# Patient Record
Sex: Male | Born: 1995 | Race: White | Hispanic: No | Marital: Single | Smoking: Current every day smoker
Health system: Southern US, Community
[De-identification: ages and names within clinical notes are randomized; demographics above are authoritative.]

## PROBLEM LIST (undated history)

## (undated) DIAGNOSIS — F419 Anxiety disorder, unspecified: Secondary | ICD-10-CM

## (undated) DIAGNOSIS — R569 Unspecified convulsions: Secondary | ICD-10-CM

## (undated) DIAGNOSIS — F32A Depression, unspecified: Secondary | ICD-10-CM

## (undated) DIAGNOSIS — Z973 Presence of spectacles and contact lenses: Secondary | ICD-10-CM

## (undated) DIAGNOSIS — F1910 Other psychoactive substance abuse, uncomplicated: Secondary | ICD-10-CM

## (undated) DIAGNOSIS — F329 Major depressive disorder, single episode, unspecified: Secondary | ICD-10-CM

## (undated) HISTORY — PX: FRACTURE SURGERY: SHX138

## (undated) HISTORY — PX: COLON SURGERY: SHX602

## (undated) HISTORY — DX: Presence of spectacles and contact lenses: Z97.3

---
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
## 2016-06-23 ENCOUNTER — Emergency Department (HOSPITAL_COMMUNITY)
Admission: EM | Admit: 2016-06-23 | Discharge: 2016-06-23 | Disposition: A | Discharge status: Home / Self Care | Payer: Medicaid Other | Attending: Emergency Medicine | Admitting: Emergency Medicine

## 2016-06-23 ENCOUNTER — Encounter (HOSPITAL_COMMUNITY): Payer: Self-pay | Admitting: *Deleted

## 2016-06-23 DIAGNOSIS — N3000 Acute cystitis without hematuria: Secondary | ICD-10-CM | POA: Insufficient documentation

## 2016-06-23 DIAGNOSIS — Z791 Long term (current) use of non-steroidal anti-inflammatories (NSAID): Secondary | ICD-10-CM | POA: Insufficient documentation

## 2016-06-23 DIAGNOSIS — J45909 Unspecified asthma, uncomplicated: Secondary | ICD-10-CM | POA: Insufficient documentation

## 2016-06-23 DIAGNOSIS — F418 Other specified anxiety disorders: Secondary | ICD-10-CM | POA: Insufficient documentation

## 2016-06-23 DIAGNOSIS — Z79899 Other long term (current) drug therapy: Secondary | ICD-10-CM | POA: Insufficient documentation

## 2016-06-23 DIAGNOSIS — F17210 Nicotine dependence, cigarettes, uncomplicated: Secondary | ICD-10-CM | POA: Insufficient documentation

## 2016-06-23 DIAGNOSIS — F419 Anxiety disorder, unspecified: Secondary | ICD-10-CM

## 2016-06-23 DIAGNOSIS — F329 Major depressive disorder, single episode, unspecified: Secondary | ICD-10-CM

## 2016-06-23 HISTORY — DX: Anxiety disorder, unspecified: F41.9

## 2016-06-23 LAB — CBC WITH DIFFERENTIAL/PLATELET
Basophils Absolute: 0 10*3/uL (ref 0.0–0.1)
Basophils Relative: 0 %
EOS ABS: 0 10*3/uL (ref 0.0–0.7)
EOS PCT: 0 %
HCT: 45.3 % (ref 39.0–52.0)
HEMOGLOBIN: 15.7 g/dL (ref 13.0–17.0)
LYMPHS ABS: 2.4 10*3/uL (ref 0.7–4.0)
LYMPHS PCT: 20 %
MCH: 32.3 pg (ref 26.0–34.0)
MCHC: 34.7 g/dL (ref 30.0–36.0)
MCV: 93.2 fL (ref 78.0–100.0)
MONOS PCT: 7 %
Monocytes Absolute: 0.8 10*3/uL (ref 0.1–1.0)
Neutro Abs: 8.7 10*3/uL — ABNORMAL HIGH (ref 1.7–7.7)
Neutrophils Relative %: 73 %
PLATELETS: 259 10*3/uL (ref 150–400)
RBC: 4.86 MIL/uL (ref 4.22–5.81)
RDW: 12.7 % (ref 11.5–15.5)
WBC: 11.9 10*3/uL — ABNORMAL HIGH (ref 4.0–10.5)

## 2016-06-23 LAB — COMPREHENSIVE METABOLIC PANEL
ALK PHOS: 73 U/L (ref 38–126)
ALT: 19 U/L (ref 17–63)
ANION GAP: 10 (ref 5–15)
AST: 25 U/L (ref 15–41)
Albumin: 4.9 g/dL (ref 3.5–5.0)
BUN: 13 mg/dL (ref 6–20)
CALCIUM: 9.5 mg/dL (ref 8.9–10.3)
CO2: 26 mmol/L (ref 22–32)
CREATININE: 0.88 mg/dL (ref 0.61–1.24)
Chloride: 101 mmol/L (ref 101–111)
Glucose, Bld: 77 mg/dL (ref 65–99)
Potassium: 3.3 mmol/L — ABNORMAL LOW (ref 3.5–5.1)
SODIUM: 137 mmol/L (ref 135–145)
Total Bilirubin: 0.7 mg/dL (ref 0.3–1.2)
Total Protein: 8.3 g/dL — ABNORMAL HIGH (ref 6.5–8.1)

## 2016-06-23 LAB — URINALYSIS, ROUTINE W REFLEX MICROSCOPIC
BILIRUBIN URINE: NEGATIVE
GLUCOSE, UA: 50 mg/dL — AB
HGB URINE DIPSTICK: NEGATIVE
KETONES UR: 20 mg/dL — AB
NITRITE: NEGATIVE
PH: 7 (ref 5.0–8.0)
Protein, ur: 30 mg/dL — AB
SPECIFIC GRAVITY, URINE: 1.023 (ref 1.005–1.030)

## 2016-06-23 MED ORDER — CEPHALEXIN 500 MG PO CAPS: 500.0000 mg | ORAL_CAPSULE | Freq: Four times a day (QID) | ORAL | 0 refills | Status: DC

## 2016-06-23 MED ORDER — ALPRAZOLAM 0.25 MG PO TABS: 0.2500 mg | ORAL_TABLET | Freq: Three times a day (TID) | ORAL | 0 refills | Status: DC | PRN

## 2016-06-23 NOTE — ED Notes (Signed)
Patient states "I've dealt with depression for a long time. I used to be on medicine but I don't remember what it's called. I stopped taking it a long time ago because I didn't like the way it made me feel. I don't have any family at all. I'm nauseated and just feel really anxious." Patient denies SI/HI.

## 2016-06-23 NOTE — ED Triage Notes (Signed)
Pt c/o panic attack since last night. Reports hx anxiety and depression. Denies SI/HI. Pt reports decreased PO intake over the last 3 days due to depression and anxiety.

## 2016-06-24 LAB — GC/CHLAMYDIA PROBE AMP (~~LOC~~) NOT AT ARMC
CHLAMYDIA, DNA PROBE: POSITIVE — AB
Neisseria Gonorrhea: NEGATIVE

## 2016-06-25 LAB — URINE CULTURE
CULTURE: NO GROWTH
Special Requests: NORMAL

## 2016-06-26 NOTE — ED Provider Notes (Signed)
[facility]-EM[facility]ENCY DEPT Provider Note   CSN: [id number] Arrival date & time: 06/23/16  1129     History   Chief Complaint Chief Complaint  Patient presents with  . Panic Attack    HPI Marc [name] is a 20 y.o. male who has a long standing history of anxiety and depression asking for assistance with increased anxiety over the past several weeks.  He denies any specific inciting event and denies full blown panic attack.  He denies suicidal or homicidal ideation or intent as well.  He reports a distant history of being on anti depressants but did not like how they made him feel so prefers to not take them.  He also reports having some mild nausea over the past several days, several episodes of body aches and feeling feverish.  He denies vomiting, uri or respiratory complaint, denies abdominal pain, diarrhea.  He does endorse increased urinary frequency but denies dysuria, hematuria, back pain and increased thirst.  He denies penile discharge.  He is sexually active with one partner.   The history is provided by the patient.    Past Medical History:  Diagnosis Date  . Anxiety   . Asthma   . Depressed   . Wears glasses     Patient Active Problem List   Diagnosis Date Noted  . Depression, major, recurrent, in partial remission (HCC) 06/03/2011  . ELBOW SPRAIN, LEFT 09/17/2010    Past Surgical History:  Procedure Laterality Date  . COLON SURGERY    . FRACTURE SURGERY         Home Medications    Prior to Admission medications   Medication Sig Start Date End Date Taking? Authorizing Provider  albuterol (PROVENTIL HFA;VENTOLIN HFA) 108 (90 BASE) MCG/ACT inhaler Inhale 2 puffs into the lungs every 6 (six) hours as needed for wheezing.    Historical Provider, MD  ALPRAZolam Prudy Feeler(XANAX) 0.25 MG tablet Take 1 tablet (0.25 mg total) by mouth 3 (three) times daily as needed for anxiety. 06/23/16   Burgess AmorJulie Dionta Larke, PA-C  cephALEXin (KEFLEX) 500 MG capsule Take 1 capsule (500 mg total) by  mouth 4 (four) times daily. 06/23/16   Burgess AmorJulie Lakechia Nay, PA-C  HYDROcodone-acetaminophen (NORCO) 5-325 MG per tablet Take 1 tablet by mouth every 4 (four) hours as needed for moderate pain. 08/06/13   Ivery QualeHobson Bryant, PA-C  methocarbamol (ROBAXIN) 500 MG tablet Take 1 tablet (500 mg total) by mouth 3 (three) times daily. 08/06/13   Ivery QualeHobson Bryant, PA-C  naproxen sodium (ANAPROX) 275 MG tablet Take one tablet twice daily as needed for knee pain. 04/27/14   Paula LibraJohn Molpus, MD    Family History Family History  Problem Relation Age of Onset  . Anxiety disorder Father   . Depression Father   . Anxiety disorder Paternal Grandfather   . Anxiety disorder Mother   . Depression Mother   . Depression Cousin   . Drug abuse Cousin     Social History Social History  Substance Use Topics  . Smoking status: Current Every Day Smoker    Packs/day: 2.00    Types: Cigarettes  . Smokeless tobacco: Never Used  . Alcohol use Yes     Comment: occasionally     Allergies   Promethazine hcl   Review of Systems Review of Systems  Constitutional: Positive for fever.  HENT: Negative for congestion and sore throat.   Eyes: Negative.   Respiratory: Negative for chest tightness and shortness of breath.   Cardiovascular: Negative for chest pain.  Gastrointestinal:  Positive for nausea. Negative for abdominal pain, diarrhea and vomiting.  Genitourinary: Positive for frequency. Negative for discharge, flank pain and hematuria.  Musculoskeletal: Negative for arthralgias, joint swelling and neck pain.  Skin: Negative.  Negative for rash and wound.  Neurological: Negative for dizziness, weakness, light-headedness, numbness and headaches.  Psychiatric/Behavioral: Negative for hallucinations, self-injury and suicidal ideas. The patient is nervous/anxious.      Physical Exam Updated Vital Signs BP 144/89 (BP Location: Left Arm)   Pulse 95   Temp 98.5 F (36.9 C) (Oral)   Resp 15   Ht 5\' 5"  (1.651 m)   Wt 52.2 kg    SpO2 98%   BMI 19.14 kg/m   Physical Exam  Constitutional: He appears well-developed and well-nourished.  HENT:  Head: Normocephalic and atraumatic.  Eyes: Conjunctivae are normal.  Neck: Normal range of motion.  Cardiovascular: Normal rate, regular rhythm, normal heart sounds and intact distal pulses.   Pulmonary/Chest: Effort normal and breath sounds normal. He has no wheezes.  Abdominal: Soft. Bowel sounds are normal. He exhibits no distension. There is no tenderness. There is no guarding and no CVA tenderness.  Musculoskeletal: Normal range of motion.  Neurological: He is alert.  Skin: Skin is warm and dry.  Psychiatric: His speech is normal and behavior is normal. Judgment and thought content normal. Cognition and memory are normal. He exhibits a depressed mood. He expresses no homicidal and no suicidal ideation. He expresses no suicidal plans and no homicidal plans.  Flat affect.  Nursing note and vitals reviewed.    ED Treatments / Results  Labs (all labs ordered are listed, but only abnormal results are displayed) Labs Reviewed  CBC WITH DIFFERENTIAL/PLATELET - Abnormal; Notable for the following:       Result Value   WBC 11.9 (*)    Neutro Abs 8.7 (*)    All other components within normal limits  COMPREHENSIVE METABOLIC PANEL - Abnormal; Notable for the following:    Potassium 3.3 (*)    Total Protein 8.3 (*)    All other components within normal limits  URINALYSIS, ROUTINE W REFLEX MICROSCOPIC - Abnormal; Notable for the following:    Color, Urine AMBER (*)    APPearance CLOUDY (*)    Glucose, UA 50 (*)    Ketones, ur 20 (*)    Protein, ur 30 (*)    Leukocytes, UA SMALL (*)    Bacteria, UA RARE (*)    All other components within normal limits  GC/CHLAMYDIA PROBE AMP ([facility]) NOT AT [facility] - Abnormal; Notable for the following:    Chlamydia **POSITIVE** (*)    All other components within normal limits  URINE CULTURE    EKG  EKG Interpretation None         Radiology No results found.  Procedures Procedures (including critical care time)  Medications Ordered in ED Medications - No data to display   Initial Impression / Assessment and Plan / ED Course  I have reviewed the triage vital signs and the nursing notes.  Pertinent labs & imaging results that were available during my care of the patient were reviewed by me and considered in my medical decision making (see chart for details).  Clinical Course     Pt with anxiety and depression, no suicidal or homicidal ideation or intent.  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  He was given a small script of ativan, advised he needs to f/u for counseling for definitive tx.  Pt understands.  Discussed UA, pt denies any sx suggesting std, offered prophylactic tx,  Pt defers, gc/chlamydia added to urine sample.  Pt understands results pending.   Final Clinical Impressions(s) / ED Diagnoses   Final diagnoses:  Acute cystitis without hematuria  Anxiety and depression    New Prescriptions Discharge Medication List as of 06/23/2016  3:19 PM    START taking these medications   Details  ALPRAZolam (XANAX) 0.25 MG tablet Take 1 tablet (0.25 mg total) by mouth 3 (three) times daily as needed for anxiety., Starting Tue 06/23/2016, Print    cephALEXin (KEFLEX) 500 MG capsule Take 1 capsule (500 mg total) by mouth 4 (four) times daily., Starting Tue 06/23/2016, Print         Burgess AmorJulie Jamese Trauger, PA-C 06/26/16 1410     Pt's std urine screen positive for chlamydia.  Prescription for zithromax  To be called in.  Notified charge RN who will notify pt of results and prescription.    Burgess AmorJulie Tyaira Heward, PA-C 06/26/16 1446    Bethann BerkshireJoseph Zammit, MD 06/26/16 [phone]

## 2016-12-01 ENCOUNTER — Encounter (HOSPITAL_COMMUNITY): Payer: Self-pay | Admitting: *Deleted

## 2016-12-01 ENCOUNTER — Emergency Department (HOSPITAL_COMMUNITY)
Admission: EM | Admit: 2016-12-01 | Discharge: 2016-12-01 | Disposition: A | Discharge status: Home / Self Care | Payer: Medicaid Other | Attending: Emergency Medicine | Admitting: Emergency Medicine

## 2016-12-01 DIAGNOSIS — F17210 Nicotine dependence, cigarettes, uncomplicated: Secondary | ICD-10-CM | POA: Insufficient documentation

## 2016-12-01 DIAGNOSIS — F410 Panic disorder [episodic paroxysmal anxiety] without agoraphobia: Secondary | ICD-10-CM | POA: Insufficient documentation

## 2016-12-01 DIAGNOSIS — J45909 Unspecified asthma, uncomplicated: Secondary | ICD-10-CM | POA: Insufficient documentation

## 2016-12-01 NOTE — ED Notes (Signed)
Pt made aware to return if symptoms worsen or if any life threatening symptoms occur.   

## 2016-12-01 NOTE — ED Triage Notes (Signed)
Pt is here for anxiety.  He states that he was seen early this year for the same and was given a prescription for xanax which helped but pt states that his anxiety has become worse.  Pt reports that he is not speaking with anyone in his family and he is out of work, has no transportation all of which is adding to his anxiety.  He states that his depression and anxiety is bad bc he just can't get ahead.  Pt denies any SI/HI.  He would like to be sent somewhere to get help with anxiety and depression.

## 2016-12-01 NOTE — Discharge Instructions (Signed)
ER if symptoms worsen 

## 2016-12-01 NOTE — ED Notes (Signed)
Pt states anxiety and depression from "life itself and things that have gone on for past few years." Pt states he is generally alone and constantly trying to get ahead and not being able to, then becomes depressed and cannot control anxiety/depression. Pt denies si/hi, is calm and cooperative at this time.
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
## 2017-01-14 ENCOUNTER — Encounter (HOSPITAL_COMMUNITY): Payer: Self-pay | Admitting: Emergency Medicine

## 2017-01-14 ENCOUNTER — Emergency Department (HOSPITAL_COMMUNITY)
Admission: EM | Admit: 2017-01-14 | Discharge: 2017-01-15 | Disposition: A | Discharge status: Home / Self Care | Payer: Self-pay | Attending: Emergency Medicine | Admitting: Emergency Medicine

## 2017-01-14 DIAGNOSIS — J45909 Unspecified asthma, uncomplicated: Secondary | ICD-10-CM | POA: Insufficient documentation

## 2017-01-14 DIAGNOSIS — F410 Panic disorder [episodic paroxysmal anxiety] without agoraphobia: Secondary | ICD-10-CM | POA: Insufficient documentation

## 2017-01-14 DIAGNOSIS — F17210 Nicotine dependence, cigarettes, uncomplicated: Secondary | ICD-10-CM | POA: Insufficient documentation

## 2017-01-14 NOTE — ED Triage Notes (Signed)
Pt comes in with anxiety, chest pain, and head pain. He usually takes medication but ran out 2 days ago. NAD noted. Pt is anxious upon triage.

## 2017-01-15 LAB — BASIC METABOLIC PANEL
Anion gap: 10 (ref 5–15)
BUN: 16 mg/dL (ref 6–20)
CALCIUM: 9.4 mg/dL (ref 8.9–10.3)
CHLORIDE: 102 mmol/L (ref 101–111)
CO2: 27 mmol/L (ref 22–32)
Creatinine, Ser: 0.9 mg/dL (ref 0.61–1.24)
Glucose, Bld: 85 mg/dL (ref 65–99)
Potassium: 3.9 mmol/L (ref 3.5–5.1)
SODIUM: 139 mmol/L (ref 135–145)

## 2017-01-15 LAB — CBC WITH DIFFERENTIAL/PLATELET
BASOS PCT: 0 %
Basophils Absolute: 0 10*3/uL (ref 0.0–0.1)
EOS ABS: 0.4 10*3/uL (ref 0.0–0.7)
EOS PCT: 4 %
HCT: 43.7 % (ref 39.0–52.0)
HEMOGLOBIN: 15.4 g/dL (ref 13.0–17.0)
Lymphocytes Relative: 41 %
Lymphs Abs: 3.9 10*3/uL (ref 0.7–4.0)
MCH: 31.9 pg (ref 26.0–34.0)
MCHC: 35.2 g/dL (ref 30.0–36.0)
MCV: 90.5 fL (ref 78.0–100.0)
MONOS PCT: 9 %
Monocytes Absolute: 0.8 10*3/uL (ref 0.1–1.0)
NEUTROS PCT: 46 %
Neutro Abs: 4.3 10*3/uL (ref 1.7–7.7)
PLATELETS: 216 10*3/uL (ref 150–400)
RBC: 4.83 MIL/uL (ref 4.22–5.81)
RDW: 12.4 % (ref 11.5–15.5)
WBC: 9.5 10*3/uL (ref 4.0–10.5)

## 2017-01-15 LAB — TROPONIN I

## 2017-01-15 MED ORDER — ALPRAZOLAM 0.25 MG PO TABS: 0.2500 mg | ORAL_TABLET | Freq: Three times a day (TID) | ORAL | 0 refills | Status: DC

## 2017-01-15 MED ORDER — LORAZEPAM 2 MG/ML IJ SOLN
1.0000 mg | Freq: Once | INTRAMUSCULAR | Status: AC
  Administered 2017-01-15: 1 mg via INTRAMUSCULAR
  Filled 2017-01-15: qty 1

## 2017-01-15 NOTE — Discharge Instructions (Signed)
Your electrocardiogram and your cardiac enzyme all negative for acute cardiac related problem. Your oxygen level is 100% on room air. I suspect that your chest discomfort is related to anxiety and panic attack. Please see your primary physician or mental health professional concerning your anxiety. Use Xanax 3 times daily.

## 2017-01-15 NOTE — ED Provider Notes (Signed)
[facility] Provider Note   CSN: [id number] Arrival date & time: 01/14/17  1659     History   Chief Complaint Chief Complaint  Patient presents with  . Panic Attack    HPI Marc [name] is a 21 y.o. male.  Patient is a 21 year old male who presents to the emergency department with a complaint of panic attack and chest pain.  Patient states that he has a history of anxiety and panic attacks. He states that he has been out of his medication over the last 2-3 days on. He had a friend and had a prolonged seizure that really upset him. He began to have some chest pain. He tried to calm himself down, but since it seems as though the anxiety was getting worse. He then went into panic for most of the day and as the day progressed his chest pain got worse so he came to the emergency department for additional evaluation and management. The patient denies unusual sweats. There's been no vomiting reported. No unusual weakness. No loss of consciousness.      Past Medical History:  Diagnosis Date  . Anxiety   . Asthma   . Depressed   . Wears glasses     Patient Active Problem List   Diagnosis Date Noted  . Depression, major, recurrent, in partial remission (HCC) 06/03/2011  . ELBOW SPRAIN, LEFT 09/17/2010    Past Surgical History:  Procedure Laterality Date  . COLON SURGERY    . FRACTURE SURGERY         Home Medications    Prior to Admission medications   Medication Sig Start Date End Date Taking? Authorizing Provider  albuterol (PROVENTIL HFA;VENTOLIN HFA) 108 (90 BASE) MCG/ACT inhaler Inhale 2 puffs into the lungs every 6 (six) hours as needed for wheezing.    [provider]  ALPRAZolam Prudy Feeler) 0.25 MG tablet Take 1 tablet (0.25 mg total) by mouth 3 (three) times daily as needed for anxiety. 06/23/16   Burgess Amor, PA-C  cephALEXin (KEFLEX) 500 MG capsule Take 1 capsule (500 mg total) by mouth 4 (four) times daily. 06/23/16   Burgess Amor, PA-C    HYDROcodone-acetaminophen (NORCO) 5-325 MG per tablet Take 1 tablet by mouth every 4 (four) hours as needed for moderate pain. 08/06/13   Ivery Quale, PA-C  methocarbamol (ROBAXIN) 500 MG tablet Take 1 tablet (500 mg total) by mouth 3 (three) times daily. 08/06/13   Ivery Quale, PA-C  naproxen sodium (ANAPROX) 275 MG tablet Take one tablet twice daily as needed for knee pain. 04/27/14   Molpus, John, MD    Family History Family History  Problem Relation Age of Onset  . Anxiety disorder Father   . Depression Father   . Anxiety disorder Paternal Grandfather   . Anxiety disorder Mother   . Depression Mother   . Depression Cousin   . Drug abuse Cousin     Social History Social History  Substance Use Topics  . Smoking status: Current Every Day Smoker    Packs/day: 2.00    Types: Cigarettes  . Smokeless tobacco: Never Used  . Alcohol use Yes     Comment: occasionally     Allergies   Promethazine hcl   Review of Systems Review of Systems  Constitutional: Negative for activity change and appetite change.  HENT: Negative for congestion, ear discharge, ear pain, facial swelling, nosebleeds, rhinorrhea, sneezing and tinnitus.   Eyes: Negative for photophobia, pain and discharge.  Respiratory: Negative for cough, choking,  shortness of breath and wheezing.   Cardiovascular: Positive for chest pain. Negative for palpitations and leg swelling.  Gastrointestinal: Negative for abdominal pain, blood in stool, constipation, diarrhea, nausea and vomiting.  Genitourinary: Negative for difficulty urinating, dysuria, flank pain, frequency and hematuria.  Musculoskeletal: Negative for back pain, gait problem, myalgias and neck pain.  Skin: Negative for color change, rash and wound.  Neurological: Negative for dizziness, seizures, syncope, facial asymmetry, speech difficulty, weakness and numbness.  Hematological: Negative for adenopathy. Does not bruise/bleed easily.   Psychiatric/Behavioral: Negative for agitation, confusion, hallucinations, self-injury and suicidal ideas. The patient is nervous/anxious.      Physical Exam Updated Vital Signs BP 116/84   Pulse (!) 50   Resp 12   Ht 5\' 5"  (1.651 m)   Wt 54.4 kg (120 lb)   SpO2 99%   BMI 19.97 kg/m   Physical Exam  Constitutional: He is oriented to person, place, and time. He appears well-developed and well-nourished.  Non-toxic appearance.  HENT:  Head: Normocephalic.  Right Ear: Tympanic membrane and external ear normal.  Left Ear: Tympanic membrane and external ear normal.  Eyes: EOM and lids are normal. Pupils are equal, round, and reactive to light.  Neck: Normal range of motion. Neck supple. Carotid bruit is not present.  Cardiovascular: Normal rate, regular rhythm, normal heart sounds, intact distal pulses and normal pulses.   Pulmonary/Chest: Breath sounds normal. No respiratory distress.  Abdominal: Soft. Bowel sounds are normal. There is no tenderness. There is no guarding.  Musculoskeletal: Normal range of motion.  Lymphadenopathy:       Head (right side): No submandibular adenopathy present.       Head (left side): No submandibular adenopathy present.    He has no cervical adenopathy.  Neurological: He is alert and oriented to person, place, and time. He has normal strength. No cranial nerve deficit or sensory deficit.  Skin: Skin is warm and dry.  Psychiatric: His speech is normal. His mood appears anxious. He expresses no homicidal and no suicidal ideation. He expresses no suicidal plans and no homicidal plans.  Nursing note and vitals reviewed.    ED Treatments / Results  Labs (all labs ordered are listed, but only abnormal results are displayed) Labs Reviewed  CBC WITH DIFFERENTIAL/PLATELET  BASIC METABOLIC PANEL  TROPONIN I    EKG  EKG Interpretation None       Radiology No results found.  Procedures Procedures (including critical care time)  Medications  Ordered in ED Medications  LORazepam (ATIVAN) injection 1 mg (not administered)     Initial Impression / Assessment and Plan / ED Course  I have reviewed the triage vital signs and the nursing notes.  Pertinent labs & imaging results that were available during my care of the patient were reviewed by me and considered in my medical decision making (see chart for details).       Final Clinical Impressions(s) / ED Diagnoses MDM Electrocardiogram shows a normal sinus rhythm. Vital signs reviewed. Pulse oximetry is 99-100% during the emergency department visit.  The complete blood count was well within normal limits. Basic metabolic panel is normal. Troponin is negative for acute event. I ambulated the patient in the room and hall without any change in his chest pain or difficulty with breathing.  Patient treated with intramuscular Ativan.  Patient states that he feels a lot better. I reviewed the examination as well as the lab findings with the patient in terms which he understands. I've asked  patient to see his mental health professional to assist him with his anxiety and panic attacks. The patient states that he will see his physician. He will also try to stay on top of his medications on. Patient acknowledges understanding of these discharge instructions. The patient denies any suicidal or homicidal ideations. Feel that it is safe for the patient to be discharged home.    Final diagnoses:  Panic attack    New Prescriptions Discharge Medication List as of 01/15/2017  1:18 AM       Ivery Quale, PA-C 01/15/17 0127    Glynn Octave, MD 01/15/17 [phone]

## 2017-01-15 NOTE — ED Notes (Signed)
Pt states understanding of care given and follow up instructions.  Pt a/o ambulated from ED with steady gait 

## 2017-03-21 ENCOUNTER — Encounter (HOSPITAL_COMMUNITY): Payer: Self-pay | Admitting: *Deleted

## 2017-03-21 ENCOUNTER — Emergency Department (HOSPITAL_COMMUNITY)
Admission: EM | Admit: 2017-03-21 | Discharge: 2017-03-23 | Disposition: A | Discharge status: Other Acute Inpatient Hospital | Payer: Self-pay | Attending: Emergency Medicine | Admitting: Emergency Medicine

## 2017-03-21 DIAGNOSIS — X789XXA Intentional self-harm by unspecified sharp object, initial encounter: Secondary | ICD-10-CM | POA: Insufficient documentation

## 2017-03-21 DIAGNOSIS — S70921A Unspecified superficial injury of right thigh, initial encounter: Secondary | ICD-10-CM | POA: Insufficient documentation

## 2017-03-21 DIAGNOSIS — F32A Depression, unspecified: Secondary | ICD-10-CM

## 2017-03-21 DIAGNOSIS — F329 Major depressive disorder, single episode, unspecified: Secondary | ICD-10-CM | POA: Insufficient documentation

## 2017-03-21 DIAGNOSIS — Z79899 Other long term (current) drug therapy: Secondary | ICD-10-CM | POA: Insufficient documentation

## 2017-03-21 DIAGNOSIS — F1910 Other psychoactive substance abuse, uncomplicated: Secondary | ICD-10-CM | POA: Insufficient documentation

## 2017-03-21 DIAGNOSIS — Z046 Encounter for general psychiatric examination, requested by authority: Secondary | ICD-10-CM | POA: Insufficient documentation

## 2017-03-21 DIAGNOSIS — Y9389 Activity, other specified: Secondary | ICD-10-CM | POA: Insufficient documentation

## 2017-03-21 DIAGNOSIS — Y929 Unspecified place or not applicable: Secondary | ICD-10-CM | POA: Insufficient documentation

## 2017-03-21 DIAGNOSIS — Y999 Unspecified external cause status: Secondary | ICD-10-CM | POA: Insufficient documentation

## 2017-03-21 DIAGNOSIS — S70922A Unspecified superficial injury of left thigh, initial encounter: Secondary | ICD-10-CM | POA: Insufficient documentation

## 2017-03-21 DIAGNOSIS — Z23 Encounter for immunization: Secondary | ICD-10-CM | POA: Insufficient documentation

## 2017-03-21 DIAGNOSIS — J45909 Unspecified asthma, uncomplicated: Secondary | ICD-10-CM | POA: Insufficient documentation

## 2017-03-21 DIAGNOSIS — F17210 Nicotine dependence, cigarettes, uncomplicated: Secondary | ICD-10-CM | POA: Insufficient documentation

## 2017-03-21 DIAGNOSIS — R45851 Suicidal ideations: Secondary | ICD-10-CM | POA: Insufficient documentation

## 2017-03-21 DIAGNOSIS — F419 Anxiety disorder, unspecified: Secondary | ICD-10-CM | POA: Insufficient documentation

## 2017-03-21 HISTORY — DX: Other psychoactive substance abuse, uncomplicated: F19.10

## 2017-03-21 LAB — ETHANOL

## 2017-03-21 LAB — COMPREHENSIVE METABOLIC PANEL
ALT: 23 U/L (ref 17–63)
AST: 27 U/L (ref 15–41)
Albumin: 4.5 g/dL (ref 3.5–5.0)
Alkaline Phosphatase: 63 U/L (ref 38–126)
Anion gap: 7 (ref 5–15)
BILIRUBIN TOTAL: 1 mg/dL (ref 0.3–1.2)
BUN: 16 mg/dL (ref 6–20)
CHLORIDE: 103 mmol/L (ref 101–111)
CO2: 29 mmol/L (ref 22–32)
CREATININE: 0.97 mg/dL (ref 0.61–1.24)
Calcium: 9.1 mg/dL (ref 8.9–10.3)
Glucose, Bld: 101 mg/dL — ABNORMAL HIGH (ref 65–99)
POTASSIUM: 3.8 mmol/L (ref 3.5–5.1)
Sodium: 139 mmol/L (ref 135–145)
TOTAL PROTEIN: 7.1 g/dL (ref 6.5–8.1)

## 2017-03-21 LAB — RAPID URINE DRUG SCREEN, HOSP PERFORMED
AMPHETAMINES: NOT DETECTED
Barbiturates: NOT DETECTED
Benzodiazepines: POSITIVE — AB
Cocaine: NOT DETECTED
OPIATES: NOT DETECTED
Tetrahydrocannabinol: POSITIVE — AB

## 2017-03-21 LAB — CBC
HCT: 43.5 % (ref 39.0–52.0)
Hemoglobin: 15.1 g/dL (ref 13.0–17.0)
MCH: 32.3 pg (ref 26.0–34.0)
MCHC: 34.7 g/dL (ref 30.0–36.0)
MCV: 92.9 fL (ref 78.0–100.0)
PLATELETS: 202 10*3/uL (ref 150–400)
RBC: 4.68 MIL/uL (ref 4.22–5.81)
RDW: 12.4 % (ref 11.5–15.5)
WBC: 9.8 10*3/uL (ref 4.0–10.5)

## 2017-03-21 LAB — SALICYLATE LEVEL

## 2017-03-21 LAB — ACETAMINOPHEN LEVEL: Acetaminophen (Tylenol), Serum: <10 ug/mL — ABNORMAL LOW (ref 10–30)

## 2017-03-21 MED ORDER — ACETAMINOPHEN 325 MG PO TABS
650.0000 mg | ORAL_TABLET | ORAL | Status: DC | PRN
  Administered 2017-03-22 – 2017-03-23 (×4): 650 mg via ORAL
  Filled 2017-03-21 (×4): qty 2

## 2017-03-21 MED ORDER — TETANUS-DIPHTH-ACELL PERTUSSIS 5-2.5-18.5 LF-MCG/0.5 IM SUSP
0.5000 mL | Freq: Once | INTRAMUSCULAR | Status: AC
  Administered 2017-03-21: 0.5 mL via INTRAMUSCULAR
  Filled 2017-03-21: qty 0.5

## 2017-03-21 MED ORDER — NICOTINE 21 MG/24HR TD PT24
21.0000 mg | MEDICATED_PATCH | Freq: Every day | TRANSDERMAL | Status: DC | PRN
  Administered 2017-03-22 – 2017-03-23 (×3): 21 mg via TRANSDERMAL
  Filled 2017-03-21 (×3): qty 1
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
## 2017-03-21 NOTE — ED Notes (Signed)
Patient stated to writer that he needs Xanax for his anxiety and a pain pill for his chronic back/neck pain. Patient reports coming to ED to get refill of xanax due to not having insurance and able to find PCP. States he sees [facility] and they will not prescribe any xanax. Patient appears calm and cooperative at this time.

## 2017-03-21 NOTE — ED Triage Notes (Signed)
Pt c/o having panic attacks, suicidal thoughts, cutting to bilateral thighs since last night. Pt states, "on my way here, I thought about jumping in front of a car". Pt states, "I'm very depressed". [facility]

## 2017-03-21 NOTE — ED Notes (Signed)
ED Provider at bedside. 

## 2017-03-21 NOTE — ED Provider Notes (Signed)
[facility] Provider Note   CSN: [id number] Arrival date & time: 03/21/17  1713     History   Chief Complaint Chief Complaint  Patient presents with  . Suicidal    HPI Marc [name] is a 21 y.o. male.  HPI  Pt was seen at 1800. Per pt, c/o gradual onset and worsening of persistent anxiety, depression and SI for the past 2 days. Has been associated with "cutting" of his bilat thighs. States his plan would be to "jump in front of a car." [facility] and "doesn't think my medicines are working." Denies HI, no hallucinations.   Past Medical History:  Diagnosis Date  . Anxiety   . Asthma   . Depressed   . Polysubstance abuse   . Wears glasses     Patient Active Problem List   Diagnosis Date Noted  . Depression, major, recurrent, in partial remission (HCC) 06/03/2011  . ELBOW SPRAIN, LEFT 09/17/2010    Past Surgical History:  Procedure Laterality Date  . COLON SURGERY    . FRACTURE SURGERY         Home Medications    Prior to Admission medications   Medication Sig Start Date End Date Taking? Authorizing Provider  albuterol (PROVENTIL HFA;VENTOLIN HFA) 108 (90 BASE) MCG/ACT inhaler Inhale 2 puffs into the lungs every 6 (six) hours as needed for wheezing.    [provider]  ALPRAZolam Prudy Feeler(XANAX) 0.25 MG tablet Take 1 tablet (0.25 mg total) by mouth 3 (three) times daily. 01/15/17   Ivery QualeBryant, Hobson, PA-C  cephALEXin (KEFLEX) 500 MG capsule Take 1 capsule (500 mg total) by mouth 4 (four) times daily. 06/23/16   Burgess AmorIdol, Julie, PA-C  HYDROcodone-acetaminophen (NORCO) 5-325 MG per tablet Take 1 tablet by mouth every 4 (four) hours as needed for moderate pain. 08/06/13   Ivery QualeBryant, Hobson, PA-C  methocarbamol (ROBAXIN) 500 MG tablet Take 1 tablet (500 mg total) by mouth 3 (three) times daily. 08/06/13   Ivery QualeBryant, Hobson, PA-C  naproxen sodium (ANAPROX) 275 MG tablet Take one tablet twice daily as needed for knee pain. 04/27/14   Molpus, John, MD     Family History Family History  Problem Relation Age of Onset  . Anxiety disorder Father   . Depression Father   . Anxiety disorder Paternal Grandfather   . Anxiety disorder Mother   . Depression Mother   . Depression Cousin   . Drug abuse Cousin     Social History Social History  Substance Use Topics  . Smoking status: Current Every Day Smoker    Packs/day: 2.00    Types: Cigarettes  . Smokeless tobacco: Never Used  . Alcohol use Yes     Comment: "80oz everyday"      Allergies   Promethazine hcl   Review of Systems Review of Systems ROS: Statement: All systems negative except as marked or noted in the HPI; Constitutional: Negative for fever and chills. ; ; Eyes: Negative for eye pain, redness and discharge. ; ; ENMT: Negative for ear pain, hoarseness, nasal congestion, sinus pressure and sore throat. ; ; Cardiovascular: Negative for chest pain, palpitations, diaphoresis, dyspnea and peripheral edema. ; ; Respiratory: Negative for cough, wheezing and stridor. ; ; Gastrointestinal: Negative for nausea, vomiting, diarrhea, abdominal pain, blood in stool, hematemesis, jaundice and rectal bleeding. . ; ; Genitourinary: Negative for dysuria, flank pain and hematuria. ; ; Musculoskeletal: Negative for back pain and neck pain. Negative for swelling and trauma.; ; Skin: Negative for  pruritus, rash, abrasions, blisters, bruising and skin lesion.; ; Neuro: Negative for headache, lightheadedness and neck stiffness. Negative for weakness, altered level of consciousness, altered mental status, extremity weakness, paresthesias, involuntary movement, seizure and syncope.; Psych:  +depression, anxiety, SI. No SA, no HI, no hallucinations.      Physical Exam Updated Vital Signs BP 132/73 (BP Location: Right Arm)   Pulse (!) 113   Temp 98.7 F (37.1 C) (Oral)   Resp 19   Ht 5\' 5"  (1.651 m)   Wt 54.4 kg (120 lb)   SpO2 97%   BMI 19.97 kg/m   Physical Exam 1805: Physical  examination:  Nursing notes reviewed; Vital signs and O2 SAT reviewed;  Constitutional: Well developed, Well nourished, Well hydrated, In no acute distress; Head:  Normocephalic, atraumatic; Eyes: EOMI, PERRL, No scleral icterus; ENMT: Mouth and pharynx normal, Mucous membranes moist; Neck: Supple, Full range of motion; Cardiovascular: Regular rate and rhythm; Respiratory: Breath sounds clear, No wheezes.  Speaking full sentences with ease, Normal respiratory effort/excursion; Chest: No deformity, Movement normal; Abdomen: Nondistended; Extremities: No deformity.; Neuro: AA&Ox3, Major CN grossly intact.  Speech clear. No gross focal motor deficits in extremities. Climbs on and off stretcher easily by himself. Gait steady.; Skin: Color normal, Warm, Dry. Multiple linear superficial abrasions to bilat thighs.; Psych:  Affect flat.    ED Treatments / Results  Labs (all labs ordered are listed, but only abnormal results are displayed)   EKG  EKG Interpretation None       Radiology   Procedures Procedures (including critical care time)  Medications Ordered in ED Medications  Tdap (BOOSTRIX) injection 0.5 mL (0.5 mLs Intramuscular Given 03/21/17 1812)     Initial Impression / Assessment and Plan / ED Course  I have reviewed the triage vital signs and the nursing notes.  Pertinent labs & imaging results that were available during my care of the patient were reviewed by me and considered in my medical decision making (see chart for details).  MDM Reviewed: previous chart, nursing note and vitals Reviewed previous: labs Interpretation: labs   Results for orders placed or performed during the hospital encounter of 03/21/17  Comprehensive metabolic panel  Result Value Ref Range   Sodium 139 135 - 145 mmol/L   Potassium 3.8 3.5 - 5.1 mmol/L   Chloride 103 101 - 111 mmol/L   CO2 29 22 - 32 mmol/L   Glucose, Bld 101 (H) 65 - 99 mg/dL   BUN 16 6 - 20 mg/dL   Creatinine, Ser 3.71 0.61 -  1.24 mg/dL   Calcium 9.1 8.9 - 06.2 mg/dL   Total Protein 7.1 6.5 - 8.1 g/dL   Albumin 4.5 3.5 - 5.0 g/dL   AST 27 15 - 41 U/L   ALT 23 17 - 63 U/L   Alkaline Phosphatase 63 38 - 126 U/L   Total Bilirubin 1.0 0.3 - 1.2 mg/dL   GFR calc non Af Amer >60 >60 mL/min   GFR calc Af Amer >60 >60 mL/min   Anion gap 7 5 - 15  Ethanol  Result Value Ref Range   Alcohol, Ethyl (B) <5 <5 mg/dL  Salicylate level  Result Value Ref Range   Salicylate Lvl <7.0 2.8 - 30.0 mg/dL  Acetaminophen level  Result Value Ref Range   Acetaminophen (Tylenol), Serum <10 (L) 10 - 30 ug/mL  cbc  Result Value Ref Range   WBC 9.8 4.0 - 10.5 K/uL   RBC 4.68 4.22 - 5.81 MIL/uL  Hemoglobin 15.1 13.0 - 17.0 g/dL   HCT 13.2 44.0 - 10.2 %   MCV 92.9 78.0 - 100.0 fL   MCH 32.3 26.0 - 34.0 pg   MCHC 34.7 30.0 - 36.0 g/dL   RDW 72.5 36.6 - 44.0 %   Platelets 202 150 - 400 K/uL  Rapid urine drug screen (hospital performed)  Result Value Ref Range   Opiates NONE DETECTED NONE DETECTED   Cocaine NONE DETECTED NONE DETECTED   Benzodiazepines POSITIVE (A) NONE DETECTED   Amphetamines NONE DETECTED NONE DETECTED   Tetrahydrocannabinol POSITIVE (A) NONE DETECTED   Barbiturates NONE DETECTED NONE DETECTED    1805:  Thigh lacs are very superficial: wound care provided and Td updated.   2030:  Pt requesting "pain pill" and "xanaxl;" given hx polysubstance abuse and family stating pt already took 2 klonopin before arrival to ED, will not dose. Will have TTS evaluate.   2205:  TTS has evaluated pt: recommended inpt treatment. Holding orders written.    Final Clinical Impressions(s) / ED Diagnoses   Final diagnoses:  None    New Prescriptions New Prescriptions   No medications on file     Samuel Jester, DO 03/21/17 2247

## 2017-03-21 NOTE — ED Notes (Signed)
Family member that was sitting with patient stated to "not acknowledge any panic attack claims." She said that he took two Klonopin at home just before coming to the ED, and that she had to take the rest of the medication away from him. She wants us to call her before any decisions are made.

## 2017-03-22 MED ORDER — TRAZODONE HCL 50 MG PO TABS: 100.0000 mg | ORAL_TABLET | Freq: Every day | ORAL | Status: DC

## 2017-03-22 MED ORDER — CITALOPRAM HYDROBROMIDE 20 MG PO TABS
20.0000 mg | ORAL_TABLET | Freq: Every day | ORAL | Status: DC
  Administered 2017-03-22 – 2017-03-23 (×2): 20 mg via ORAL
  Filled 2017-03-22 (×3): qty 1

## 2017-03-22 MED ORDER — BACITRACIN ZINC 500 UNIT/GM EX OINT
TOPICAL_OINTMENT | CUTANEOUS | Status: AC
  Filled 2017-03-22: qty 2.7

## 2017-03-22 MED ORDER — HYDROXYZINE HCL 25 MG PO TABS
50.0000 mg | ORAL_TABLET | Freq: Four times a day (QID) | ORAL | Status: DC | PRN
  Administered 2017-03-22 – 2017-03-23 (×3): 50 mg via ORAL
  Filled 2017-03-22 (×3): qty 2

## 2017-03-22 NOTE — BH Assessment (Signed)
[facility] Assessment Progress Note  Pt reassessed this AM. Pt continues to report deep depression with associated SI. Pt does not feel safe enough to be d/c and feels that he may hurt himself. Triggers identified is pt being single for the past 4 years (he cites that he does not like to be alone), his father dying young when pt was 21 yrs old and ongoing panic attacks. Pt also reports not having insurance and being able to get appropriate meds for his anxiety as a stressor. IP treatment still recommended.   Johny ShockSamantha M. Ladona Ridgelaylor, MS, NCC, LPCA Counselor

## 2017-03-22 NOTE — Progress Notes (Signed)
Patient meets criteria for inpatient treatment. CSW faxed referrals to the following [facility] for review:  [facility], [place] [facility], [facility]   TTS will continue to seek bed placement.  Baldo DaubJolan Josilynn Losh MSW, LCSWA CSW Disposition [phone]

## 2017-03-22 NOTE — ED Notes (Signed)
Patient stepped out of room complaining of lower back pain and asked if he could get a MRI of his back. He states back pain x 1 year.

## 2017-03-22 NOTE — ED Notes (Signed)
Patient asked about getting his Celexa this morning. He states he doesn't think it does any good, but takes it every morning.

## 2017-03-22 NOTE — ED Notes (Signed)
Pt asking for something for pain.  °

## 2017-03-22 NOTE — ED Notes (Signed)
Patient states that he took his nicotine patch off because it wasn't working. He states he needs something stronger.

## 2017-03-22 NOTE — ED Notes (Signed)
Pt resting with eyes closed, resp even and non labored, no distress noted, sitter remains at bedside,

## 2017-03-22 NOTE — ED Notes (Signed)
Pt reports that he has a hx of depression and anxiety, lives with his grandparents and missed his therapists appointment due to no gas for ride from grand[parents  Reports on Celexa x 3 months and it has not helped, requesting xanax  Reports only cut himself this time

## 2017-03-23 ENCOUNTER — Encounter (HOSPITAL_COMMUNITY): Payer: Self-pay | Admitting: *Deleted

## 2017-03-23 ENCOUNTER — Inpatient Hospital Stay (HOSPITAL_COMMUNITY)
Admission: AD | Admit: 2017-03-23 | Discharge: 2017-03-29 | DRG: 885 | Disposition: A | Discharge status: Home / Self Care | Payer: No Typology Code available for payment source | Source: Intra-hospital | Attending: Psychiatry | Admitting: Psychiatry

## 2017-03-23 DIAGNOSIS — F319 Bipolar disorder, unspecified: Secondary | ICD-10-CM | POA: Diagnosis present

## 2017-03-23 DIAGNOSIS — F332 Major depressive disorder, recurrent severe without psychotic features: Secondary | ICD-10-CM

## 2017-03-23 DIAGNOSIS — Z590 Homelessness: Secondary | ICD-10-CM | POA: Diagnosis not present

## 2017-03-23 DIAGNOSIS — F419 Anxiety disorder, unspecified: Secondary | ICD-10-CM | POA: Diagnosis present

## 2017-03-23 DIAGNOSIS — Z813 Family history of other psychoactive substance abuse and dependence: Secondary | ICD-10-CM | POA: Diagnosis not present

## 2017-03-23 DIAGNOSIS — Z23 Encounter for immunization: Secondary | ICD-10-CM

## 2017-03-23 DIAGNOSIS — F1910 Other psychoactive substance abuse, uncomplicated: Secondary | ICD-10-CM | POA: Diagnosis present

## 2017-03-23 DIAGNOSIS — Z79899 Other long term (current) drug therapy: Secondary | ICD-10-CM | POA: Diagnosis not present

## 2017-03-23 DIAGNOSIS — J45909 Unspecified asthma, uncomplicated: Secondary | ICD-10-CM | POA: Diagnosis present

## 2017-03-23 DIAGNOSIS — G4700 Insomnia, unspecified: Secondary | ICD-10-CM | POA: Diagnosis present

## 2017-03-23 DIAGNOSIS — Z818 Family history of other mental and behavioral disorders: Secondary | ICD-10-CM

## 2017-03-23 DIAGNOSIS — Z888 Allergy status to other drugs, medicaments and biological substances status: Secondary | ICD-10-CM

## 2017-03-23 DIAGNOSIS — F17210 Nicotine dependence, cigarettes, uncomplicated: Secondary | ICD-10-CM | POA: Diagnosis present

## 2017-03-23 DIAGNOSIS — F314 Bipolar disorder, current episode depressed, severe, without psychotic features: Secondary | ICD-10-CM | POA: Diagnosis not present

## 2017-03-23 DIAGNOSIS — Z560 Unemployment, unspecified: Secondary | ICD-10-CM | POA: Diagnosis not present

## 2017-03-23 LAB — URINALYSIS, ROUTINE W REFLEX MICROSCOPIC
Bilirubin Urine: NEGATIVE
Glucose, UA: NEGATIVE mg/dL
Hgb urine dipstick: NEGATIVE
KETONES UR: NEGATIVE mg/dL
LEUKOCYTES UA: NEGATIVE
NITRITE: NEGATIVE
PROTEIN: NEGATIVE mg/dL
Specific Gravity, Urine: 1.024 (ref 1.005–1.030)
pH: 6 (ref 5.0–8.0)

## 2017-03-23 MED ORDER — TRAZODONE HCL 100 MG PO TABS
100.0000 mg | ORAL_TABLET | Freq: Every day | ORAL | Status: DC
  Administered 2017-03-23: 100 mg via ORAL
  Filled 2017-03-23 (×3): qty 1

## 2017-03-23 MED ORDER — HYDROXYZINE HCL 50 MG PO TABS
50.0000 mg | ORAL_TABLET | Freq: Four times a day (QID) | ORAL | Status: DC | PRN
  Administered 2017-03-23 – 2017-03-24 (×2): 50 mg via ORAL
  Filled 2017-03-23 (×2): qty 1

## 2017-03-23 MED ORDER — ALPRAZOLAM 0.5 MG PO TABS
0.5000 mg | ORAL_TABLET | Freq: Once | ORAL | Status: AC
  Administered 2017-03-23: 0.5 mg via ORAL
  Filled 2017-03-23: qty 1

## 2017-03-23 MED ORDER — INFLUENZA VAC SPLIT QUAD 0.5 ML IM SUSY
0.5000 mL | PREFILLED_SYRINGE | INTRAMUSCULAR | Status: AC
  Administered 2017-03-24: 0.5 mL via INTRAMUSCULAR
  Filled 2017-03-23: qty 0.5

## 2017-03-23 MED ORDER — IBUPROFEN 600 MG PO TABS
600.0000 mg | ORAL_TABLET | Freq: Four times a day (QID) | ORAL | Status: DC | PRN
  Administered 2017-03-23 – 2017-03-29 (×12): 600 mg via ORAL
  Filled 2017-03-23 (×12): qty 1

## 2017-03-23 MED ORDER — BACITRACIN ZINC 500 UNIT/GM EX OINT
TOPICAL_OINTMENT | Freq: Once | CUTANEOUS | Status: AC
  Administered 2017-03-23: 1 via TOPICAL

## 2017-03-23 MED ORDER — NICOTINE POLACRILEX 2 MG MT GUM
2.0000 mg | CHEWING_GUM | OROMUCOSAL | Status: DC | PRN
  Administered 2017-03-23: 2 mg via ORAL
  Filled 2017-03-23 (×2): qty 1

## 2017-03-23 MED ORDER — NICOTINE 21 MG/24HR TD PT24: 21.0000 mg | MEDICATED_PATCH | Freq: Every day | TRANSDERMAL | Status: DC | PRN

## 2017-03-23 MED ORDER — ACETAMINOPHEN 325 MG PO TABS
650.0000 mg | ORAL_TABLET | ORAL | Status: DC | PRN
  Administered 2017-03-23 – 2017-03-25 (×5): 650 mg via ORAL
  Filled 2017-03-23 (×5): qty 2

## 2017-03-23 MED ORDER — METHOCARBAMOL 750 MG PO TABS
750.0000 mg | ORAL_TABLET | Freq: Four times a day (QID) | ORAL | Status: DC | PRN
  Administered 2017-03-23 – 2017-03-29 (×10): 750 mg via ORAL
  Filled 2017-03-23 (×10): qty 1

## 2017-03-23 MED ORDER — PNEUMOCOCCAL VAC POLYVALENT 25 MCG/0.5ML IJ INJ
0.5000 mL | INJECTION | INTRAMUSCULAR | Status: AC
  Administered 2017-03-24: 0.5 mL via INTRAMUSCULAR

## 2017-03-23 MED ORDER — ALUM & MAG HYDROXIDE-SIMETH 200-200-20 MG/5ML PO SUSP: 30.0000 mL | ORAL | Status: DC | PRN

## 2017-03-23 MED ORDER — MAGNESIUM HYDROXIDE 400 MG/5ML PO SUSP
30.0000 mL | Freq: Every day | ORAL | Status: DC | PRN
Start: 2017-03-23 — End: 2017-03-29
  Administered 2017-03-25: 30 mL via ORAL
  Filled 2017-03-23: qty 30

## 2017-03-23 MED ORDER — CITALOPRAM HYDROBROMIDE 20 MG PO TABS
20.0000 mg | ORAL_TABLET | Freq: Every day | ORAL | Status: DC
  Administered 2017-03-24: 20 mg via ORAL
  Filled 2017-03-23 (×3): qty 1

## 2017-03-23 MED ORDER — BACITRACIN ZINC 500 UNIT/GM EX OINT
TOPICAL_OINTMENT | CUTANEOUS | Status: AC
  Administered 2017-03-23: 1 via TOPICAL
  Filled 2017-03-23: qty 0.9

## 2017-03-23 NOTE — ED Notes (Addendum)
Pt reports increasing anxiety and says only xanax makes his anxiety better.  Pt says vistaril only makes him sleep. EDP notified.

## 2017-03-23 NOTE — ED Notes (Signed)
Pt resting on stretcher with eyes closed, resp even and non labored, no distress noted,

## 2017-03-23 NOTE — ED Notes (Signed)
[facility] and [facility]. Report given to Waynetta SandyJan Wright, RN.

## 2017-03-23 NOTE — Progress Notes (Signed)
[facility] Marc [name] [facility] ; Accepting provider is Nira ConnJason Berry, NP; Attending provider is Dr.Cobos.   Patient can arrive at 2:00pm. Number for report is [phone].    Marc OdeaJason Vaughn, RN notified.      Marc [name] MSW, LCSWA CSW Disposition [phone]

## 2017-03-23 NOTE — ED Notes (Signed)
Pt escorted to shower with sitter and RPD,

## 2017-03-23 NOTE — Progress Notes (Signed)
[facility] Group Notes:  (Nursing/MHT/Case Management/Adjunct)  Date:  03/23/2017  Time:  2100  Type of Therapy:  wrap up group  Participation Level:  Active  Participation Quality:  Appropriate, Attentive, Sharing and Supportive  Affect:  Appropriate  Cognitive:  Appropriate  Insight:  Improving  Engagement in Group:  Engaged  Modes of Intervention:  Clarification, Education and Support  Summary of Progress/Problems: Pt shares that he has been highly depressed, anxious, and insecure and these are  constant feelings for him. Pt would like to move onto a more stable living situation rather than on his bipolar grandmother's couch whom he reports treats him unfairly like he is a drug addict because her other grandchildren are. Pt would like to gain control over his depression and stop over thinking so much.    Marcille BuffyMcNeil, Margeaux Swantek S 03/23/2017, 11:27 PM

## 2017-03-23 NOTE — Tx Team (Signed)
Initial Treatment Plan 03/23/2017 4:41 PM Marc A Trigo [id number]    PATIENT STRESSORS: Educational concerns Financial difficulties Loss of family members Substance abuse   PATIENT STRENGTHS: Barrister's clerkCommunication skills Motivation for treatment/growth Physical Health   PATIENT IDENTIFIED PROBLEMS: Substance Abuse  At risk for suicide  Anxiety  "Stable living situation"  "Medication for anxiety"             DISCHARGE CRITERIA:  Ability to meet basic life and health needs Adequate post-discharge living arrangements Improved stabilization in mood, thinking, and/or behavior Motivation to continue treatment in a less acute level of care Need for constant or close observation no longer present Withdrawal symptoms are absent or subacute and managed without 24-hour nursing intervention  PRELIMINARY DISCHARGE PLAN: Attend 12-step recovery group Outpatient therapy  PATIENT/FAMILY INVOLVEMENT: This treatment plan has been presented to and reviewed with the patient, Marc [name].  The patient and family have been given the opportunity to ask questions and make suggestions.  [name], [name], RN 03/23/2017, 4:41 PM

## 2017-03-23 NOTE — ED Notes (Signed)
Pt  Called RN into room, states that he had a "bad dream" and that in his dream he was overdosing. Pt anxious, prn medication given, comfort measures provided,

## 2017-03-23 NOTE — ED Notes (Signed)
Pt states that he is not sleepy, requesting sleeping medication, RN explained to pt that it was almost morning time, pt requested shower to help relax,

## 2017-03-23 NOTE — ED Notes (Signed)
[facility] called for transport to [facility]. 

## 2017-03-23 NOTE — Progress Notes (Signed)
Pt [facility] to the hospital [facility] where he reported thoughts to jump in front of a car. Pt made superficial cuts to bilateral thighs while drinking alcohol.. [facility]. Pt reports that they will not give him xanax that he needs for anxiety. He gets xanax off the streets and says that he takes other peoples pain medication. He reports that his stressor is being homeless. He sometimes stays with his grandparents and other times couch surfs. He is not working and has a hx of being arrested for selling marijuana. Other stressors include death of grandfather, father and a friend died of heroin OD last month. He has a poor relationship with his mother and she also has a hx of drug use.

## 2017-03-23 NOTE — ED Provider Notes (Signed)
Patient has been accepted for inpatient psychiatric care at [place] [facility] [place]. Call has been completed. Patient is stable. He had some increasing anxiety his morning was given 0.5 of Xanax. He requested routine STD testing. Urine for GC and chlamydia was obtained and is currently pending.   Rolland PorterJames, Soundra Lampley, MD 03/23/17 1255

## 2017-03-23 NOTE — ED Notes (Signed)
Pt requesting a shower. This nurse made him aware it was 0730 and he just had a shower at 0300 today. States he wants a shower to wake up. Made patient aware this would be his shower for today.

## 2017-03-23 NOTE — ED Notes (Signed)
ED Provider at bedside. 

## 2017-03-24 DIAGNOSIS — F1910 Other psychoactive substance abuse, uncomplicated: Secondary | ICD-10-CM

## 2017-03-24 DIAGNOSIS — Z590 Homelessness: Secondary | ICD-10-CM

## 2017-03-24 DIAGNOSIS — F332 Major depressive disorder, recurrent severe without psychotic features: Secondary | ICD-10-CM

## 2017-03-24 DIAGNOSIS — Z634 Disappearance and death of family member: Secondary | ICD-10-CM

## 2017-03-24 DIAGNOSIS — Z560 Unemployment, unspecified: Secondary | ICD-10-CM

## 2017-03-24 MED ORDER — LOPERAMIDE HCL 2 MG PO CAPS: 2.0000 mg | ORAL_CAPSULE | ORAL | Status: AC | PRN

## 2017-03-24 MED ORDER — BACITRACIN-NEOMYCIN-POLYMYXIN 400-5-5000 EX OINT
TOPICAL_OINTMENT | Freq: Two times a day (BID) | CUTANEOUS | Status: DC
  Administered 2017-03-24: 1 via TOPICAL
  Administered 2017-03-25: 08:00:00 via TOPICAL
  Administered 2017-03-26 – 2017-03-27 (×2): 1 via TOPICAL
  Administered 2017-03-27: 21:00:00 via TOPICAL
  Filled 2017-03-24 (×4): qty 1

## 2017-03-24 MED ORDER — QUETIAPINE FUMARATE 25 MG PO TABS: 25.0000 mg | ORAL_TABLET | Freq: Two times a day (BID) | ORAL | Status: DC | PRN

## 2017-03-24 MED ORDER — HYDROXYZINE HCL 25 MG PO TABS
25.0000 mg | ORAL_TABLET | Freq: Four times a day (QID) | ORAL | Status: AC | PRN
  Administered 2017-03-24 – 2017-03-27 (×7): 25 mg via ORAL
  Filled 2017-03-24 (×8): qty 1

## 2017-03-24 MED ORDER — QUETIAPINE FUMARATE 50 MG PO TABS
50.0000 mg | ORAL_TABLET | Freq: Every day | ORAL | Status: DC
  Filled 2017-03-24: qty 1

## 2017-03-24 MED ORDER — CHLORDIAZEPOXIDE HCL 25 MG PO CAPS: 25.0000 mg | ORAL_CAPSULE | Freq: Three times a day (TID) | ORAL | Status: DC | PRN

## 2017-03-24 MED ORDER — CITALOPRAM HYDROBROMIDE 10 MG PO TABS
10.0000 mg | ORAL_TABLET | Freq: Every day | ORAL | Status: DC
Start: 2017-03-24 — End: 2017-03-24

## 2017-03-24 MED ORDER — GABAPENTIN 100 MG PO CAPS
100.0000 mg | ORAL_CAPSULE | Freq: Three times a day (TID) | ORAL | Status: DC
  Administered 2017-03-24 – 2017-03-25 (×4): 100 mg via ORAL
  Filled 2017-03-24 (×10): qty 1

## 2017-03-24 MED ORDER — ADULT MULTIVITAMIN W/MINERALS CH
1.0000 | ORAL_TABLET | Freq: Every day | ORAL | Status: DC
  Administered 2017-03-24 – 2017-03-29 (×6): 1 via ORAL
  Filled 2017-03-24 (×9): qty 1

## 2017-03-24 MED ORDER — TRAZODONE HCL 50 MG PO TABS
50.0000 mg | ORAL_TABLET | Freq: Every evening | ORAL | Status: DC | PRN
  Administered 2017-03-24 – 2017-03-28 (×5): 50 mg via ORAL
  Filled 2017-03-24: qty 1
  Filled 2017-03-24: qty 7
  Filled 2017-03-24 (×4): qty 1

## 2017-03-24 MED ORDER — QUETIAPINE FUMARATE 25 MG PO TABS
25.0000 mg | ORAL_TABLET | Freq: Once | ORAL | Status: AC
  Administered 2017-03-24: 25 mg via ORAL
  Filled 2017-03-24 (×2): qty 1

## 2017-03-24 MED ORDER — NICOTINE 21 MG/24HR TD PT24
21.0000 mg | MEDICATED_PATCH | Freq: Every day | TRANSDERMAL | Status: DC
  Administered 2017-03-24 – 2017-03-29 (×6): 21 mg via TRANSDERMAL
  Filled 2017-03-24 (×7): qty 1

## 2017-03-24 MED ORDER — THIAMINE HCL 100 MG/ML IJ SOLN: 100.0000 mg | Freq: Once | INTRAMUSCULAR | Status: DC

## 2017-03-24 MED ORDER — LORAZEPAM 1 MG PO TABS
1.0000 mg | ORAL_TABLET | Freq: Four times a day (QID) | ORAL | Status: DC | PRN
  Administered 2017-03-24 – 2017-03-25 (×2): 1 mg via ORAL
  Filled 2017-03-24 (×3): qty 1

## 2017-03-24 MED ORDER — CITALOPRAM HYDROBROMIDE 10 MG PO TABS
10.0000 mg | ORAL_TABLET | Freq: Every day | ORAL | Status: DC
  Filled 2017-03-24: qty 1

## 2017-03-24 MED ORDER — ONDANSETRON 4 MG PO TBDP
4.0000 mg | ORAL_TABLET | Freq: Four times a day (QID) | ORAL | Status: AC | PRN
  Administered 2017-03-25: 4 mg via ORAL
  Filled 2017-03-24: qty 1

## 2017-03-24 MED ORDER — VITAMIN B-1 100 MG PO TABS
100.0000 mg | ORAL_TABLET | Freq: Every day | ORAL | Status: DC
  Administered 2017-03-25 – 2017-03-29 (×5): 100 mg via ORAL
  Filled 2017-03-24 (×7): qty 1

## 2017-03-24 MED ORDER — DULOXETINE HCL 30 MG PO CPEP
30.0000 mg | ORAL_CAPSULE | Freq: Every day | ORAL | Status: DC
  Administered 2017-03-24 – 2017-03-25 (×2): 30 mg via ORAL
  Filled 2017-03-24 (×6): qty 1

## 2017-03-24 NOTE — [facility] Suicide Risk Assessment (Addendum)
[facility]   Nursing information obtained from:  Patient Demographic factors:  Male, Low socioeconomic status, Living alone, Unemployed Current Mental Status:  Suicidal ideation indicated by patient, Self-harm thoughts, Self-harm behaviors Loss Factors:  Loss of significant relationship, Financial problems / change in socioeconomic status Historical Factors:  Family history of mental illness or substance abuse Risk Reduction Factors:  NA  Total Time spent with patient: 45 minutes Principal Problem: Bipolar disorder, unspecified (HCC) Diagnosis:   Patient Active Problem List   Diagnosis Date Noted  . Bipolar disorder, unspecified (HCC) [F31.9] 03/23/2017  . Depression, major, recurrent, in partial remission (HCC) [F33.41] 06/03/2011  . ELBOW SPRAIN, LEFT [[id number]] 09/17/2010    Continued Clinical Symptoms:  Alcohol Use Disorder Identification Test Final Score (AUDIT): 22 The "Alcohol Use Disorders Identification Test", Guidelines for Use in Primary Care, Second Edition.  World Science writerHealth Organization [facility](WHO). Score between 0-7:  no or low risk or alcohol related problems. Score between 8-15:  moderate risk of alcohol related problems. Score between 16-19:  high risk of alcohol related problems. Score 20 or above:  warrants further diagnostic evaluation for alcohol dependence and treatment.   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 Reports worsening anxiety with frequent panic attacks . States " I feel constantly anxious". He also reports worsening depression, and states he has been feeling depressed " for three years basically all the time". States he recently started cutting on thighs in an attempt to feel " some relief", not suicidal in intent . He states he has had suicidal ideations and has had thoughts of walking into traffic .  Patient states he has been  taking either Xanax or Klonopin ( which he procures on street) , which he states he has been taking once a week or less. States " I am self treating my anxiety".  He also states he drinks daily, 2-3 40 ounce beers per day.  Admission UDS is positive for BZDs, Cannabis. Admission BAL <5  Has had one prior psychiatric admission for suicidal ideations at age 21. States he has never attempted suicide in the past, and reports self cutting episodes just recently started, denies history of psychosis.  Reports short lived mood swings , but does not endorse any clear history of hypomania or mania . 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 Asthma.   Dx- MDD , severe , recurrent . Consider  GAD. BZD and Alcohol Use Disorder   Plan - inpatient admission. Patient presents medication seeking, requesting BZDs - at this time no significant symptoms of WDL, but vitals are stable. He reports frequent BZD and daily ETOH prior to admission. Ativan detox protocol (PRN)  Start Cymbalta 30 mgrs QDAY for depression and anxiety, pain Start Neurontin 100 mgrs TID for anxiety , pain Patient requesting STD work up, states he has had STDs in the past, and is concerned he may have one    Musculoskeletal: Strength & Muscle Tone: within normal limits Gait & Station: normal Patient leans: N/A  Psychiatric Specialty Exam: Physical Exam  ROS mild headache, no chest pain , no shortness of breath, no vomiting , describes constipation, no fever , no chills   Blood pressure 108/75, pulse 71, temperature (!) 97.5 F (36.4 C), temperature source Oral, resp. rate 16, height 5\' 5"  (1.651 m), weight 55.3 kg (122 lb), SpO2 100 %.Body mass index  is 20.3 kg/m.  General Appearance: Fairly Groomed  Eye Contact:  Fair  Speech:  Normal Rate  Volume:  Decreased  Mood:  Depressed  Affect:  constricted, sad, anxious   Thought Process:  Linear and Descriptions of Associations: Intact   Orientation:  Full (Time, Place, and Person)  Thought Content:  denies hallucinations, no delusions, not internally preoccupied - somatically focused, reports lower back, bilateral knee discomfort   Suicidal Thoughts:  No denies any current suicidal plan or intention and is able to contract for safety on unit   Homicidal Thoughts:  No  Memory:  recent and remote grossly intact   Judgement:  Fair  Insight:  Fair  Psychomotor Activity:  Normal - minimal distal tremors   Concentration:  Concentration: Fair and Attention Span: Fair  Recall:  Good  Fund of Knowledge:  Good  Language:  Good  Akathisia:  Negative  Handed:  Right  AIMS (if indicated):     Assets:  Communication Skills Desire for Improvement Resilience  ADL's:  Fair   Cognition:  WNL  Sleep:  Number of Hours: 6.25      COGNITIVE FEATURES THAT CONTRIBUTE TO RISK:  Closed-mindedness and Loss of executive function    SUICIDE RISK:   Moderate:  Frequent suicidal ideation with limited intensity, and duration, some specificity in terms of plans, no associated intent, good self-control, limited dysphoria/symptomatology, some risk factors present, and identifiable protective factors, including available and accessible social support.  PLAN OF CARE: Patient will be admitted to inpatient psychiatric unit for stabilization and safety. Will provide and encourage milieu participation. Provide medication management and maked adjustments as needed.  Will follow daily.    I certify that inpatient services furnished can reasonably be expected to improve the patient's condition.   Craige Cotta, MD 03/24/2017, 2:04 PM

## 2017-03-24 NOTE — Plan of Care (Signed)
Problem: Safety: Goal: Periods of time without injury will increase Outcome: Progressing Patient has not engaged in self harm, denies thoughts to do so. No SI.  Problem: Medication: Goal: Compliance with prescribed medication regimen will improve Outcome: Progressing Patient med compliant thus far. Requesting prn meds as needed.

## 2017-03-24 NOTE — BHH Counselor (Signed)
Adult Comprehensive Assessment  Patient ID: Marc [name], male   DOB: [birth year], 21 y.o.   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 strained with mother- I don't really have contact with her. father deceased. no contact with brothers Financial / Lack of resources (include bankruptcy): some income from babysitting. no insurance Housing / Lack of housing: lives with grandma--"I don't know how much longer I'll be there." Physical health (include injuries & life threatening diseases): back pain from work accident Social relationships: poor-few friends in community Substance abuse: alcohol 80oz on average daily; marijuana daily; "I buy xanax and pain pills to self medicate. I have bad anxiety and pain in my back." Bereavement / Loss: death of friend to heroin recently. grandfather who raised him and father are deceased. "I took those deaths really hard."   Living/Environment/Situation:  Living Arrangements: Other relatives Living conditions (as described by patient or guardian): living with grandmother for the past month How long has patient lived in current situation?: one month. prior to this, was staying with friend.  What is atmosphere in current home: Temporary  Family History:  Marital status: Single Are you sexually active?: No What is your sexual orientation?: heterosexual Has your sexual activity been affected by drugs, alcohol, medication, or emotional stress?: n/a  Does patient have children?: No  Childhood History:  By whom was/is the patient raised?: Grandparents Additional childhood history information: raised primarily by his grandfather; my mom was working Theme park manager and let me do whatever I wanted. my dad died in  12-15-10.  Description of patient's relationship with caregiver when they were a child: close to grandfather and father until his death in 12/15/2010. strained with mother Patient's description of current relationship with people who raised him/her: strained with grandmother with whom he currently lives. father deceased. grandfather deceased. strained with mother. "I have as little contact as possible with her. she makes me very anxious." How were you disciplined when you got in trouble as a child/adolescent?: yelled at.  Does patient have siblings?: Yes Number of Siblings: 4 Description of patient's current relationship with siblings: all brothers; one in prison; one in the military; no contact with other two siblings.  Did patient suffer any verbal/emotional/physical/sexual abuse as a child?: No Did patient suffer from severe childhood neglect?: No Has patient ever been sexually abused/assaulted/raped as an adolescent or adult?: No Was the patient ever a victim of a crime or a disaster?: No Witnessed domestic violence?: No Has patient been effected by domestic violence as an adult?: No  Education:  Highest grade of school patient has completed: currently in GED program.  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  How long has patient been employed?: one month  Patient's job has been impacted by current illness: No What is the longest time patient has a held a job?: few months Where was the patient employed at that time?: landscaping  Has patient ever been in the [employer]?: No Has patient ever served in combat?: No Did You Receive Any Psychiatric Treatment/Services While in Equities trader?: No Are There Guns or Other Weapons in Your Home?: No Are These Weapons Safely Secured?:  (n/a)  Financial Resources:  Financial resources: Income from employment,  Support from parents / caregiver Does patient have a Lawyerrepresentative payee or guardian?: No  Alcohol/Substance Abuse:   What has been your use of drugs/alcohol within the last 12 months?: alcohol-about 80 oz daily; marijuana daily; opiates/pain pills and xanax "I buy them off the street because it's all that helps my anxiety and back pain." pt minimizes substance abuse.  If attempted suicide, did drugs/alcohol play a role in this?: Yes (2012) Alcohol/Substance Abuse Treatment Hx: Past Tx, Inpatient, Past Tx, Outpatient If yes, describe treatment: 2012-"[facility] after saying I was suicidal and doing drugs. " [facility] currently  Has alcohol/substance abuse ever caused legal problems?: Yes (misdeamor marijuana posession. )  Social Support System:   Patient's Community Support System: Poor Describe Community Support System: few close friends Type of faith/religion: christian How does patient's faith help to cope with current illness?: prayer  Leisure/Recreation:   Leisure and Hobbies: spending time outside  Strengths/Needs:   What things does the patient do well?: "I don't know." In what areas does patient struggle / problems for patient: coping with loss; pain issues  Discharge Plan:   Does patient have access to transportation?: Yes (walk or grandmother will drive him.) Will patient be returning to same living situation after discharge?: Yes Currently receiving community mental health services: Yes (From Whom) ([facility]) If no, would patient like referral for services when discharged?: Yes (What county[place] Does patient have financial barriers related to discharge medications?: Yes Patient description of barriers related to discharge medications: limited income/ no insurance  Summary/Recommendations:   Summary and Recommendations (to be completed by the evaluator): Patient is 21 yo male  [place], [place] grandmother. Patient has a diagnosis of MDD, panic disorder, and Alcohol use disorder.  He currently denies SI/HI/AVH. 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 Recommendations for patient include: crisis stabilization, therapeutic milieu, encourage group attendance and participation, and development of comprehensive mental wellness/sobriety plan. CSW Gordan Paymentassessing-he will likely return home and resume outpatient [facility].    Ledell PeoplesHeather N Smart LCSW 03/24/2017 11:39 AM

## 2017-03-24 NOTE — B[facility] Suicide Risk Assessment (Signed)
[facility] INPATIENT:  Family/Significant Other Suicide Prevention Education  Suicide Prevention Education:  Patient Refusal for Family/Significant Other Suicide Prevention Education: The patient Marc [name] has refused to provide written consent for family/significant other to be provided Family/Significant Other Suicide Prevention Education during admission and/or prior to discharge.  Physician notified.  SPE completed with pt, as pt refused to consent to family contact. SPI pamphlet provided to pt and pt was encouraged to share information with support network, ask questions, and talk about any concerns relating to SPE. Pt denies access to guns/firearms and verbalized understanding of information provided. Mobile Crisis information also provided to pt.   Archit Leger N Smart LCSW 03/24/2017, 11:19 AM

## 2017-03-24 NOTE — Progress Notes (Signed)
[facility]aise and asking that temp be obtained. Patient states he feels weak, fatigued and has all over body aches. VS obtained and stable. Afebrile. Reminded patient he received both flu and pneumonia vaccine which is most likely causing discomfort. Advil prn given for pain that he rates at a 5/10. Patient verbalizes understanding. Will reassess in 1 hour. Fluids provided and encouraged.

## 2017-03-24 NOTE — Progress Notes (Signed)
Recreation Therapy Notes  Date: 03/24/17 Time: 0930 Location: 400 Hall Dayroom  Group Topic: Stress Management  Goal Area(s) Addresses:  Patient will verbalize importance of using healthy stress management.  Patient will identify positive emotions associated with healthy stress management.   Behavioral Response: Engaged  Intervention: Stress Management  Activity :  [facility].  LRT introduced the stress management technique of guided imagery.  LRT read a script to allow patients to engage in a mental vacation in the wilderness.  Patients were to follow along as the script was read to participate in the activity.  Education:  Stress Management, Discharge Planning.   Education Outcome: Acknowledges edcuation/In group clarification offered/Needs additional education  Clinical Observations/Feedback: Pt attended group.   Marc [name], LRT/CTRS         Lillia AbedLindsay, [name] 03/24/2017 1:11 PM

## 2017-03-24 NOTE — Progress Notes (Signed)
D: Patient observed in dayroom playing cards with peers. Patient verbalizes to this Clinical research associatewriter he is in severe back pain (8/10) and anxiety "is through the roof." "I need to see the doctor about getting something for my pain. I also need some xanax to control my anxiety." Patient's affect animated, anxious with congruent mood. Can be irritable at times, intrusive. Per self inventory and discussions with writer, rates depression at an 8/10, hopelessness at an 8/10 and anxiety at a 10/10. Rates sleep as good, appetite as fair, energy as low and concentration as good.  States goal for today is to "talk to the doctor to get help with my issues. Speak how I feel."  A: Medicated per orders, prn vistaril and tylenol given for discomfort. Level III obs in place for safety. Emotional support offered and self inventory reviewed. Encouraged completion of Suicide Safety Plan and programming participation. Discussed POC with MD, SW. NP and MD made aware of patient's multiple requests to be seen. Fall prevention plan in place and reviewed with patient as pt is a high fall risk due to his report of hx of seizures. (No record in chart.)   R: Patient verbalizes understanding of POC, falls prevention education. On reassess, patient states no change in anxiety or pain. Patient denies SI/HI/AVH and remains safe on level III obs. Will continue to monitor closely and make verbal contact frequently.

## 2017-03-24 NOTE — Progress Notes (Signed)
Pt reports he was just admitted to the hospital and is having a lot of anxiety and c/o moderate to sever back pain.  He states he has had chronic back pain for about a year.  He also states that the medications he was given (Tylenol and Vistaril) did not help at all.  He reports back pain at 9/10.  Writer spoke with pt about his concerns and then reported to the evening provider the pt's concerns.  Pt was ordered Ibuprofen 600 mg q6h and Robaxin 750 mg as well.  Pt had been informed that he was ordered Trazodone 100 mg at HS which would help his anxiety as well as initiate sleep.  Pt's assessment report stated that pt has been getting Xanax "off the street" and taking pain medications from friends.  At this time, pt denies SI/HI/AVH.  He is irritable and arrogant about trying to get the medications that he wants, but he did accept the meds that were offered to him.  He was encouraged to speak to the doctor tomorrow about his medication concerns.  Support and encouragement offered.  Discharge plans are in process.  Safety maintained with q15 minute checks.

## 2017-03-24 NOTE — B[facility] Group Notes (Signed)
LCSW Group Therapy Note  03/24/2017 1:15pm  Type of Therapy/Topic:  Group Therapy:  Balance in Life  Participation Level:  Active  Description of Group:    This group will address the concept of balance and how it feels and looks when one is unbalanced. Patients will be encouraged to process areas in their lives that are out of balance and identify reasons for remaining unbalanced. Facilitators will guide patients in utilizing problem-solving interventions to address and correct the stressor making their life unbalanced. Understanding and applying boundaries will be explored and addressed for obtaining and maintaining a balanced life. Patients will be encouraged to explore ways to assertively make their unbalanced needs known to significant others in their lives, using other group members and facilitator for support and feedback.  Therapeutic Goals: 1. Patient will identify two or more emotions or situations they have that consume much of in their lives. 2. Patient will identify signs/triggers that life has become out of balance:  3. Patient will identify two ways to set boundaries in order to achieve balance in their lives:  4. Patient will demonstrate ability to communicate their needs through discussion and/or role plays  Summary of Patient Progress:  Marc [name] was attentive and engaged during today's processing group. He states for him, balance means "being stable." He reports that he feels unbalanced due to "no car, no job, having to rely on people, feeling like a burden." Pt reports that he is working toward goals but it often feels like "1 step forward 10 steps back." Marc [name] chose a magazine picture of a little boy posing for a picture. "It takes time and patience to sit there and have your portrait done. That looks like balance to me." He continues to show progress in the group setting with improving insight.     Therapeutic Modalities:   Cognitive Behavioral Therapy Solution-Focused  Therapy Assertiveness Training  Pulte HomesHeather N Smart, LCSW 03/24/2017 1:41 PM

## 2017-03-24 NOTE — H&P (Signed)
Psychiatric Admission Assessment Adult  Patient Identification: Marc [name] MRN:  [id number] Date of Evaluation:  03/24/2017 Chief Complaint:  MDD RECURRENT SEVERE POLYSUBSTANCE USE DISORDER Principal Diagnosis: Bipolar disorder, unspecified ([place]) Diagnosis:   Patient Active Problem List   Diagnosis Date Noted  . Bipolar disorder, unspecified ([place]) [F31.9] 03/23/2017  . Depression, major, recurrent, in partial remission ([place]) [F33.41] 06/03/2011  . ELBOW SPRAIN, LEFT [[id number]] 09/17/2010   History of Present Illness:  Admission Note: Pt admitted to the hospital voluntary from [facility] where he reported thoughts to jump in front of a car. Pt made superficial cuts to bilateral thighs while drinking alcohol.. He has been going to [facility] where he receives celexa. Pt reports that they will not give him xanax that he needs for anxiety. He gets xanax off the streets and says that he takes other peoples pain medication. He reports that his stressor is being homeless. He sometimes stays with his grandparents and other times couch surfs. He is not working and has a hx of being arrested for selling marijuana. Other stressors include death of grandfather, father and a friend died of heroin OD last month. He has a poor relationship with his mother and she also has a hx of drug use.    Patient verified admission note information. Patient continues to be focused on getting Benzo's for anxiety and reports drinking 2- 40 oz beers a day as well. Patient also has complaint of back and leg pain from a work accident in March and is requesting x-rays done while in the hospital. He refuses to discuss his depression and thoughts of SI that brought him in and only focuses on medications for pain or anxiety. Discussed starting Seroquel for his Bipolar d/o and he agreed. Patient kept requesting to speak to physician about his medications and patient was informed that all decisions were discussed with physician.      Associated Signs/Symptoms: Depression Symptoms:  depressed mood, insomnia, suicidal thoughts with specific plan, anxiety, panic attacks, (Hypo) Manic Symptoms:  Elevated Mood, Impulsivity, Anxiety Symptoms:  Excessive Worry, Panic Symptoms, Social Anxiety, Psychotic Symptoms:  Denies PTSD Symptoms: NA Total Time spent with patient: 45 minutes  Past Psychiatric History: Anxiety, Bipolar, Depression  Is the patient at risk to self? Yes.    Has the patient been a risk to self in the past 6 months? No.  Has the patient been a risk to self within the distant past? Yes.    Is the patient a risk to others? No.  Has the patient been a risk to others in the past 6 months? No.  Has the patient been a risk to others within the distant past? No.   Prior Inpatient Therapy:   Prior Outpatient Therapy:    Alcohol Screening: 1. How often do you have a drink containing alcohol?: 2 to 4 times a month 2. How many drinks containing alcohol do you have on a typical day when you are drinking?: 5 or 6 3. How often do you have six or more drinks on one occasion?: Daily or almost daily Preliminary Score: 6 4. How often during the last year have you found that you were not able to stop drinking once you had started?: Weekly 5. How often during the last year have you failed to do what was normally expected from you becasue of drinking?: Monthly 6. How often during the last year have you needed a first drink in the morning to get yourself going after a heavy drinking  session?: Less than monthly 7. How often during the last year have you had a feeling of guilt of remorse after drinking?: Weekly 8. How often during the last year have you been unable to remember what happened the night before because you had been drinking?: Less than monthly 9. Have you or someone else been injured as a result of your drinking?: No 10. Has a relative or friend or a doctor or another health worker been concerned about  your drinking or suggested you cut down?: Yes, during the last year Alcohol Use Disorder Identification Test Final Score (AUDIT): 22 Brief Intervention: Yes Substance Abuse History in the last 12 months:  Yes.  - Xanax bought from the street Consequences of Substance Abuse: Medical Consequences:  reviewed Previous Psychotropic Medications: Yes - only reports Celexa Psychological Evaluations: Yes - [facility] Past Medical History:  Past Medical History:  Diagnosis Date  . Anxiety   . Asthma   . Depressed   . Polysubstance abuse   . Wears glasses     Past Surgical History:  Procedure Laterality Date  . COLON SURGERY    . FRACTURE SURGERY     Family History:  Family History  Problem Relation Age of Onset  . Anxiety disorder Father   . Depression Father   . Anxiety disorder Paternal Grandfather   . Anxiety disorder Mother   . Depression Mother   . Depression Cousin   . Drug abuse Cousin    Family Psychiatric  History: Denies Tobacco Screening: Have you used any form of tobacco in the last 30 days? (Cigarettes, Smokeless Tobacco, Cigars, and/or Pipes): Yes Tobacco use, Select all that apply: 5 or more cigarettes per day Are you interested in Tobacco Cessation Medications?: Yes, will notify MD for an order Counseled patient on smoking cessation including recognizing danger situations, developing coping skills and basic information about quitting provided: Yes Social History:  History  Alcohol Use  . Yes    Comment: "80oz everyday"      History  Drug Use  . Types: Marijuana    Comment: last used today 03/21/17    Additional Social History: Marital status: Single Are you sexually active?: No What is your sexual orientation?: heterosexual Has your sexual activity been affected by drugs, alcohol, medication, or emotional stress?: n/a  Does patient have children?: No                         Allergies:   Allergies  Allergen Reactions  . Promethazine Hcl Other (See  Comments)    Seizures    Lab Results:  Results for orders placed or performed during the hospital encounter of 03/21/17 (from the past 48 hour(s))  Urinalysis, Routine w reflex microscopic     Status: None   Collection Time: 03/23/17 11:43 AM  Result Value Ref Range   Color, Urine YELLOW YELLOW   APPearance CLEAR CLEAR   Specific Gravity, Urine 1.024 1.005 - 1.030   pH 6.0 5.0 - 8.0   Glucose, UA NEGATIVE NEGATIVE mg/dL   Hgb urine dipstick NEGATIVE NEGATIVE   Bilirubin Urine NEGATIVE NEGATIVE   Ketones, ur NEGATIVE NEGATIVE mg/dL   Protein, ur NEGATIVE NEGATIVE mg/dL   Nitrite NEGATIVE NEGATIVE   Leukocytes, UA NEGATIVE NEGATIVE    Blood Alcohol level:  Lab Results  Component Value Date   ETH <5 71/24/5809    Metabolic Disorder Labs:  Lab Results  Component Value Date   HGBA1C 5.6 04/28/2011  MPG 114 04/28/2011   No results found for: PROLACTIN Lab Results  Component Value Date   CHOL 140 04/28/2011   TRIG 55 04/28/2011   HDL 48 04/28/2011   CHOLHDL 2.9 04/28/2011   VLDL 11 04/28/2011   LDLCALC 81 04/28/2011    Current Medications: Current Facility-Administered Medications  Medication Dose Route Frequency Provider Last Rate Last Dose  . acetaminophen (TYLENOL) tablet 650 mg  650 mg Oral Q4H PRN Okonkwo, Justina A, NP   650 mg at 03/24/17 0804  . alum & mag hydroxide-simeth (MAALOX/MYLANTA) 200-200-20 MG/5ML suspension 30 mL  30 mL Oral Q4H PRN Okonkwo, Justina A, NP      . chlordiazePOXIDE (LIBRIUM) capsule 25 mg  25 mg Oral TID PRN Money, Lowry Ram, FNP      . citalopram (CELEXA) tablet 10 mg  10 mg Oral Daily Money, Lowry Ram, FNP      . hydrOXYzine (ATARAX/VISTARIL) tablet 50 mg  50 mg Oral Q6H PRN Okonkwo, Justina A, NP   50 mg at 03/24/17 0804  . ibuprofen (ADVIL,MOTRIN) tablet 600 mg  600 mg Oral Q6H PRN Lindon Romp A, NP   600 mg at 03/23/17 2105  . magnesium hydroxide (MILK OF MAGNESIA) suspension 30 mL  30 mL Oral Daily PRN Okonkwo, Justina A, NP       . methocarbamol (ROBAXIN) tablet 750 mg  750 mg Oral Q6H PRN Lindon Romp A, NP   750 mg at 03/24/17 1303  . nicotine (NICODERM CQ - dosed in mg/24 hours) patch 21 mg  21 mg Transdermal Daily Sachi Boulay, Myer Peer, MD   21 mg at 03/24/17 0919  . QUEtiapine (SEROQUEL) tablet 25 mg  25 mg Oral BID PRN Money, Lowry Ram, FNP      . QUEtiapine (SEROQUEL) tablet 50 mg  50 mg Oral QHS Money, Travis B, FNP      . traZODone (DESYREL) tablet 100 mg  100 mg Oral QHS Okonkwo, Justina A, NP   100 mg at 03/23/17 2105   PTA Medications: Prescriptions Prior to Admission  Medication Sig Dispense Refill Last Dose  . albuterol (PROVENTIL HFA;VENTOLIN HFA) 108 (90 BASE) MCG/ACT inhaler Inhale 2 puffs into the lungs every 6 (six) hours as needed for wheezing.   Past Month at Unknown time  . ALPRAZolam (XANAX) 0.25 MG tablet Take 1 tablet (0.25 mg total) by mouth 3 (three) times daily. (Patient not taking: Reported on 03/21/2017) 21 tablet 0 Not Taking at Unknown time  . citalopram (CELEXA) 20 MG tablet Take 20 mg by mouth daily.   03/21/2017 at Unknown time  . traZODone (DESYREL) 100 MG tablet Take 100 mg by mouth at bedtime.   03/20/2017 at Unknown time    Musculoskeletal: Strength & Muscle Tone: within normal limits Gait & Station: normal Patient leans: N/A  Psychiatric Specialty Exam: Physical Exam  Nursing note and vitals reviewed. Constitutional: He is oriented to person, place, and time. He appears well-developed and well-nourished.  Cardiovascular: Normal rate.   Respiratory: Effort normal.  Musculoskeletal: Normal range of motion.  Neurological: He is alert and oriented to person, place, and time.  Skin: Skin is warm.    Review of Systems  Constitutional: Negative.   HENT: Negative.   Eyes: Negative.   Respiratory: Negative.   Cardiovascular: Negative.   Gastrointestinal: Negative.   Genitourinary: Negative.   Musculoskeletal: Negative.   Skin: Negative.   Neurological: Negative.    Endo/Heme/Allergies: Negative.     Blood pressure 108/75, pulse 71, temperature (!)  97.5 F (36.4 C), temperature source Oral, resp. rate 16, height '5\' 5"'$  (1.651 m), weight 55.3 kg (122 lb), SpO2 100 %.Body mass index is 20.3 kg/m.  General Appearance: Casual  Eye Contact:  Good  Speech:  Clear and Coherent and Normal Rate  Volume:  Normal  Mood:  Anxious and Depressed  Affect:  Flat  Thought Process:  Coherent and Descriptions of Associations: Intact  Orientation:  Full (Time, Place, and Person)  Thought Content:  WDL  Suicidal Thoughts:  No  Homicidal Thoughts:  No  Memory:  Immediate;   Good Recent;   Good Remote;   Good  Judgement:  Fair  Insight:  Lacking  Psychomotor Activity:  Normal  Concentration:  Concentration: Good and Attention Span: Good  Recall:  Good  Fund of Knowledge:  Good  Language:  Good  Akathisia:  No  Handed:  Right  AIMS (if indicated):     Assets:  Financial Resources/Insurance Housing Social Support Transportation  ADL's:  Intact  Cognition:  WNL  Sleep:  Number of Hours: 6.25    Treatment Plan Summary: Daily contact with patient to assess and evaluate symptoms and progress in treatment, Medication management and Plan is to:  See physician SRA and MAR for orders  Observation Level/Precautions:  15 minute checks  Laboratory:  Reviewed  Psychotherapy:  Group Therapy  Medications:  See [facility]  Consultations:  As needed  Discharge Concerns:  Relapse  Estimated LOS: 3-5 days  Other:  Admit to [place] for Primary Diagnosis: Bipolar disorder, unspecified ([place]) Long Term Goal(s): Improvement in symptoms so as ready for discharge  Short Term Goals: Ability to verbalize feelings will improve and Ability to disclose and discuss suicidal ideas  Physician Treatment Plan for Secondary Diagnosis: Principal Problem:   Bipolar disorder, unspecified ([place])  Long Term Goal(s): Improvement in symptoms so as ready for  discharge  Short Term Goals: Ability to maintain clinical measurements within normal limits will improve and Compliance with prescribed medications will improve  I certify that inpatient services furnished can reasonably be expected to improve the patient's condition.    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 Reports worsening anxiety with frequent panic attacks . States " I feel constantly anxious". He also reports worsening depression, and states he has been feeling depressed " for three years basically all the time". States he recently started cutting on thighs in an attempt to feel " some relief", not suicidal in intent . He states he has had suicidal ideations and has had thoughts of walking into traffic .  Patient states he has been taking either Xanax or Klonopin ( which he procures on street) , which he states he has been taking once a week or less. States " I am self treating my anxiety".  He also states he drinks daily, 2-3 40 ounce beers per day.  Admission UDS is positive for BZDs, Cannabis. Admission BAL <5  Has had one prior psychiatric admission for suicidal ideations at age 89. States he has never attempted suicide in the past, and reports self cutting episodes just recently started, denies history of psychosis.  Reports short lived mood swings , but does not endorse any clear history of hypomania or mania . 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 Asthma.   Dx- MDD , severe , recurrent . Consider  GAD. BZD and Alcohol Use Disorder   Plan - inpatient admission. Patient presents medication seeking, requesting BZDs - at this time no significant symptoms of WDL, but vitals are stable. He reports frequent BZD and daily ETOH prior to  admission. Ativan detox protocol (PRN)  Start Cymbalta 30 mgrs QDAY for depression and anxiety, pain Start Neurontin 100 mgrs TID for anxiety , pain Patient requesting STD work up, states he has had STDs in the past, and is concerned he may have one

## 2017-03-25 DIAGNOSIS — F39 Unspecified mood [affective] disorder: Secondary | ICD-10-CM

## 2017-03-25 DIAGNOSIS — G4700 Insomnia, unspecified: Secondary | ICD-10-CM

## 2017-03-25 DIAGNOSIS — F419 Anxiety disorder, unspecified: Secondary | ICD-10-CM

## 2017-03-25 DIAGNOSIS — Z818 Family history of other mental and behavioral disorders: Secondary | ICD-10-CM

## 2017-03-25 DIAGNOSIS — M549 Dorsalgia, unspecified: Secondary | ICD-10-CM

## 2017-03-25 DIAGNOSIS — F1290 Cannabis use, unspecified, uncomplicated: Secondary | ICD-10-CM

## 2017-03-25 DIAGNOSIS — Z813 Family history of other psychoactive substance abuse and dependence: Secondary | ICD-10-CM

## 2017-03-25 DIAGNOSIS — F17210 Nicotine dependence, cigarettes, uncomplicated: Secondary | ICD-10-CM

## 2017-03-25 DIAGNOSIS — F314 Bipolar disorder, current episode depressed, severe, without psychotic features: Secondary | ICD-10-CM

## 2017-03-25 LAB — GC/CHLAMYDIA PROBE AMP (~~LOC~~) NOT AT ARMC
Chlamydia: NEGATIVE
Neisseria Gonorrhea: NEGATIVE

## 2017-03-25 LAB — HIV ANTIBODY (ROUTINE TESTING W REFLEX): HIV Screen 4th Generation wRfx: NONREACTIVE

## 2017-03-25 LAB — RPR: RPR: NONREACTIVE

## 2017-03-25 MED ORDER — GABAPENTIN 100 MG PO CAPS
200.0000 mg | ORAL_CAPSULE | Freq: Once | ORAL | Status: AC
  Administered 2017-03-25: 200 mg via ORAL
  Filled 2017-03-25 (×2): qty 2

## 2017-03-25 MED ORDER — GABAPENTIN 100 MG PO CAPS
200.0000 mg | ORAL_CAPSULE | Freq: Two times a day (BID) | ORAL | Status: DC
  Administered 2017-03-26: 200 mg via ORAL
  Filled 2017-03-25 (×4): qty 2

## 2017-03-25 MED ORDER — DULOXETINE HCL 20 MG PO CPEP
40.0000 mg | ORAL_CAPSULE | Freq: Every day | ORAL | Status: DC
  Administered 2017-03-26 – 2017-03-27 (×2): 40 mg via ORAL
  Filled 2017-03-25 (×4): qty 2

## 2017-03-25 NOTE — Progress Notes (Signed)
Pt in dayroom making threats or giving warning to this writer that he is "about to really flip out". He shared that he just cursed out the nurse and is really about to lose it. It was explained to patient that I did not want him to flip out and am here to help him through his anxiety. " you know I cut myself because of pain Im in". Pt was made aware that one reason he is here is so we can keep him safe and that we dont want him to cut himself but just cant give him large doses of medicine. Pt states that he is not being listened to and just told he can not be given what he wants. When asked what he wanted it was Benzos and enough to help him with his anxiety because he was used to taking a lot. Pt stopped making threats to "flip out" and requested I tell the nurse he was sorry but continued to be agitated. Nurse medicated him with what he had available and pt laid down and eventually went to sleep.

## 2017-03-25 NOTE — Progress Notes (Signed)
D: Patient observed up and visible in the milieu. Restless, fidgety. Remains focused on medications, particularly benzos. Intrusive, somatic. Patient verbalizes to this Clinical research associatewriter multiple complaints - upset stomach, agitation, anxiety, tremors (not observed), sweating, bilat arm soreness (8/10) (from vaccines admin yesterday), chronic back pain of a 6/10 initially this AM - presently an 8/10, muscle spasms. Patient's affect animated, anxious, agitated and irritable with congruent mood. Per self inventory and discussions with writer, rates depression at an 8/10, hopelessness at an 10/10 and anxiety at a 10/10. Rates sleep as good, appetite as fair, energy as normal and concentration as poor.  States goal for today is to "get the help I need, stay in my room, listen to nurse and doctors."   A: Medicated per orders, prn ativan, advil, robaxin, and tylenol given for various complaints. Level III obs in place for safety. Emotional support offered and self inventory reviewed. Encouraged completion of Suicide Safety Plan and programming participation. Discussed POC with MD, SW.  Fall prevention plan in place and reviewed with patient as pt is a high fall risk due to his report of a seizure hx.   R: Patient verbalizes understanding of POC, falls prevention education. On reassess, patient reports little to no relief from advil for arm pain (down to a 5//10), some muscle spasm relief, no relief from ativan and states, "can't you increase it? I'm still really anxious and withdrawing." Patient denies SI while on unit however states he is unsure he could contract for safety if discharged. No HI/AVH and remains safe on level III obs. Will continue to monitor closely, make verbal contact frequently and redirect behavior as needed.

## 2017-03-25 NOTE — Progress Notes (Signed)
Pt reports he is very anxious and in a lot of pain.  He has been to med window cursing and swearing at Emerson Electricwriter because he is not getting the medications he is requesting, namely xanax and narcotic pain medications, and "the f--ing stuff we are giving him is not helping".  He adamantly denies being addicted to medications, but states he gets these meds "off the street" because his med management agency will not prescribe the meds to him.  He has been observed at other times sitting in the dayroom talking with peers and playing cards.  He denies SI/HI/AVH at this time.  He voices no other needs.  Writer administered the prn medications as ordered for the pt's complaints.  Support and encouragement offered.  Discharge plans are in process.  Safety maintained with q15 minute checks.

## 2017-03-25 NOTE — Plan of Care (Signed)
Problem: Coping: Goal: Ability to cope will improve Outcome: Not Progressing Discussion with patient and encouragement provided regarding use of coping skills. Patient unreceptive.  Problem: Medication: Goal: Compliance with prescribed medication regimen will improve Outcome: Completed/Met Date Met: 03/25/17 Patient is med compliant and frequently requests prn medications. Verbalizes intent to remain med compliant upon discharge.

## 2017-03-25 NOTE — Progress Notes (Signed)
Patient up at med window at 1700 continuing to report constant back pain and severe withdrawal - nausea, restlessness, agitation, anxiety "to the point of panic", skin crawling, sweats. Asking for prn pain med and ativan prn. Tylenlol and ativan poured along with scheduled neurontin. While at med window, patient observed placing med cup low toward waste and observed putting hand in R pocket while engaging this nurse in conversation. Patient immediately confronted and states, "I didn't put anything in my pocket. I just routinely put my hands in there." Patient asked to pull pockets out however patient only showed part of the material. "I didn't put anything in my pocket, honest." This writer reached for remaining material in pocket and ativan pill fell to the floor. Explained to patient this is a safety risk and is unacceptable. Dr. Jama Flavorsobos and Burnetta Sabin Money, NP notified. Order received to d/c prn ativan. Patient apologized stating, "I'm just so anxious and its just not strong enough so I wanted to save it for later when I could get another; then take them both together." Pointed out to patient the incongruence of his statement and informed him order had been discontinued. Medication wasted with Clotilde Dieter Dopson, RN. Patient alternatively offered vistaril which he took after dinner.

## 2017-03-25 NOTE — [facility] Group Notes (Signed)
LCSW Group Therapy Note  03/25/2017 1:15pm  Type of Therapy/Topic:  Group Therapy:  Emotion Regulation  Participation Level:  Active   Description of Group:   The purpose of this group is to assist patients in learning to regulate negative emotions and experience positive emotions. Patients will be guided to discuss ways in which they have been vulnerable to their negative emotions. These vulnerabilities will be juxtaposed with experiences of positive emotions or situations, and patients will be challenged to use positive emotions to combat negative ones. Special emphasis will be placed on coping with negative emotions in conflict situations, and patients will process healthy conflict resolution skills.  Therapeutic Goals: 1. Patient will identify two positive emotions or experiences to reflect on in order to balance out negative emotions 2. Patient will label two or more emotions that they find the most difficult to experience 3. Patient will demonstrate positive conflict resolution skills through discussion and/or role plays  Summary of Patient Progress: Pt was active in group discussion but intrusive at times. Pt discussed having anger, anxiety, and sadness and difficulty regulating these emotions. Pt was distracting at times, engaging in side conversations; however, he was observed to relate to other peer's sadness and anger related to feeling abandoned by their family.   Therapeutic Modalities:   Cognitive Behavioral Therapy Feelings Identification Dialectical Behavioral Therapy   Verdene LennertLauren C Lorien Shingler, LCSW 03/25/2017 2:58 PM

## 2017-03-25 NOTE — Progress Notes (Signed)
[facility] MD Progress Note  03/25/2017 12:52 PM Marc [name]  MRN:  [id number]   Subjective:  Patient reports that he was agitated last night because he isn't getting enough benzo's or pain medication. "I cursed out the nurse, but I am sorry I did it." He is requesting that his Ativan for CIWA be increased to 2 mg and he wants an MRI of his back that he reports hurt in March. He does deny any SI/HI/AVH, but admits depression and contracts for safety.  Objective: Patient has continued demanding medications that he is not prescribed and is well aware that no one will prescribe them to him in his current state. He has been seen interacting appropriately in the day room, attending groups and only when talking to staff does he appear depressed or complaining of anxiety. He is continued to be monitored for withdrawal symptoms on a CIWA protocol. Patient is looking for long term residential treatment.  Principal Problem: Bipolar disorder, unspecified (HCC) Diagnosis:   Patient Active Problem List   Diagnosis Date Noted  . Severe episode of recurrent major depressive disorder, without psychotic features (HCC) [F33.2]   . Bipolar disorder, unspecified (HCC) [F31.9] 03/23/2017  . Depression, major, recurrent, in partial remission (HCC) [F33.41] 06/03/2011  . ELBOW SPRAIN, LEFT [[id number]] 09/17/2010   Total Time spent with patient: 25 minutes  Past Psychiatric History: See H&P  Past Medical History:  Past Medical History:  Diagnosis Date  . Anxiety   . Asthma   . Depressed   . Polysubstance abuse   . Wears glasses     Past Surgical History:  Procedure Laterality Date  . COLON SURGERY    . FRACTURE SURGERY     Family History:  Family History  Problem Relation Age of Onset  . Anxiety disorder Father   . Depression Father   . Anxiety disorder Paternal Grandfather   . Anxiety disorder Mother   . Depression Mother   . Depression Cousin   . Drug abuse Cousin    Family Psychiatric  History:  See H&P Social History:  History  Alcohol Use  . Yes    Comment: "80oz everyday"      History  Drug Use  . Types: Marijuana    Comment: last used today 03/21/17    Social History   Social History  . Marital status: Single    Spouse name: N/A  . Number of children: N/A  . Years of education: N/A   Social History Main Topics  . Smoking status: Current Every Day Smoker    Packs/day: 2.00    Types: Cigarettes  . Smokeless tobacco: Never Used  . Alcohol use Yes     Comment: "80oz everyday"   . Drug use: Yes    Types: Marijuana     Comment: last used today 03/21/17  . Sexual activity: No   Other Topics Concern  . None   Social History Narrative  . None   Additional Social History:                         Sleep: Good  Appetite:  Good  Current Medications: Current Facility-Administered Medications  Medication Dose Route Frequency Provider Last Rate Last Dose  . acetaminophen (TYLENOL) tablet 650 mg  650 mg Oral Q4H PRN Okonkwo, Justina A, NP   650 mg at 03/25/17 1031  . alum & mag hydroxide-simeth (MAALOX/MYLANTA) 200-200-20 MG/5ML suspension 30 mL  30 mL Oral Q4H PRN Beryle Lathe,  Justina A, NP      . DULoxetine (CYMBALTA) DR capsule 30 mg  30 mg Oral Daily Nikolis Berent, Rockey SituFernando A, MD   30 mg at 03/25/17 0803  . gabapentin (NEURONTIN) capsule 100 mg  100 mg Oral TID Daeshaun Specht, Rockey SituFernando A, MD   100 mg at 03/25/17 1203  . hydrOXYzine (ATARAX/VISTARIL) tablet 25 mg  25 mg Oral Q6H PRN Jawan Chavarria, Rockey SituFernando A, MD   25 mg at 03/25/17 1205  . ibuprofen (ADVIL,MOTRIN) tablet 600 mg  600 mg Oral Q6H PRN Nira ConnBerry, Jason A, NP   600 mg at 03/25/17 0806  . loperamide (IMODIUM) capsule 2-4 mg  2-4 mg Oral PRN Wynema Garoutte, Rockey SituFernando A, MD      . LORazepam (ATIVAN) tablet 1 mg  1 mg Oral Q6H PRN Leven Hoel, Rockey SituFernando A, MD   1 mg at 03/25/17 0806  . magnesium hydroxide (MILK OF MAGNESIA) suspension 30 mL  30 mL Oral Daily PRN Okonkwo, Justina A, NP      . methocarbamol (ROBAXIN) tablet 750 mg  750 mg Oral Q6H  PRN Nira ConnBerry, Jason A, NP   750 mg at 03/25/17 0806  . multivitamin with minerals tablet 1 tablet  1 tablet Oral Daily Perle Brickhouse, Rockey SituFernando A, MD   1 tablet at 03/25/17 0803  . neomycin-bacitracin-polymyxin (NEOSPORIN) ointment   Topical BID Lenoir Facchini A, MD      . nicotine (NICODERM CQ - dosed in mg/24 hours) patch 21 mg  21 mg Transdermal Daily Serena Petterson, Rockey SituFernando A, MD   21 mg at 03/25/17 0803  . ondansetron (ZOFRAN-ODT) disintegrating tablet 4 mg  4 mg Oral Q6H PRN Renad Jenniges, Rockey SituFernando A, MD   4 mg at 03/25/17 1239  . thiamine (B-1) injection 100 mg  100 mg Intramuscular Once Aladdin Kollmann A, MD      . thiamine (VITAMIN B-1) tablet 100 mg  100 mg Oral Daily Kay Ricciuti, Rockey SituFernando A, MD   100 mg at 03/25/17 0803  . traZODone (DESYREL) tablet 50 mg  50 mg Oral QHS PRN Yuriel Lopezmartinez, Rockey SituFernando A, MD   50 mg at 03/24/17 2118    Lab Results:  Results for orders placed or performed during the hospital encounter of 03/23/17 (from the past 48 hour(s))  RPR     Status: None   Collection Time: 03/25/17  6:28 AM  Result Value Ref Range   RPR Ser Ql Non Reactive Non Reactive    Comment: (NOTE) Performed At: [facility] [address] [place], [place] [id number] Mila HomerHancock William F MD WU:[id number]:[phone] Performed at [facility], [address] [place], [place] [address]     Blood Alcohol level:  Lab Results  Component Value Date   ETH <5 03/21/2017    Metabolic Disorder Labs: Lab Results  Component Value Date   HGBA1C 5.6 04/28/2011   MPG 114 04/28/2011   No results found for: PROLACTIN Lab Results  Component Value Date   CHOL 140 04/28/2011   TRIG 55 04/28/2011   HDL 48 04/28/2011   CHOLHDL 2.9 04/28/2011   VLDL 11 04/28/2011   LDLCALC 81 04/28/2011    Physical Findings: AIMS: Facial and Oral Movements Muscles of Facial Expression: None, normal Lips and Perioral Area: None, normal Jaw: None, normal Tongue: None, normal,Extremity Movements Upper (arms, wrists, hands,  fingers): None, normal Lower (legs, knees, ankles, toes): None, normal, Trunk Movements Neck, shoulders, hips: None, normal, Overall Severity Severity of abnormal movements (highest score from questions above): None, normal Incapacitation due to abnormal movements: None, normal  Patient's awareness of abnormal movements (rate only patient's report): No Awareness, Dental Status Current problems with teeth and/or dentures?: No Does patient usually wear dentures?: No  CIWA:  CIWA-Ar Total: 12 COWS:     Musculoskeletal: Strength & Muscle Tone: within normal limits Gait & Station: normal Patient leans: N/A  Psychiatric Specialty Exam: Physical Exam  Nursing note and vitals reviewed. Constitutional: He is oriented to person, place, and time. He appears well-developed and well-nourished.  Cardiovascular: Normal rate.   Respiratory: Effort normal.  Musculoskeletal: Normal range of motion.  Neurological: He is oriented to person, place, and time.  Skin: Skin is warm.    Review of Systems  Constitutional: Negative.   HENT: Negative.   Eyes: Negative.   Respiratory: Negative.   Cardiovascular: Negative.   Gastrointestinal: Negative.   Genitourinary: Negative.   Musculoskeletal: Positive for back pain.  Skin: Negative.   Neurological: Negative.   Endo/Heme/Allergies: Negative.     Blood pressure 121/71, pulse 83, temperature 98.8 F (37.1 C), resp. rate 18, height  (1.651 m), weight 55.3 kg (122 lb), SpO2 99 %.Body mass index is 20.3 kg/m.  General Appearance: Casual and Disheveled  Eye Contact:  Good  Speech:  Clear and Coherent and Normal Rate  Volume:  Decreased  Mood:  Depressed  Affect:  Depressed and Flat  Thought Process:  Coherent and Descriptions of Associations: Intact  Orientation:  Full (Time, Place, and Person)  Thought Content:  WDL  Suicidal Thoughts:  No  Homicidal Thoughts:  No  Memory:  Immediate;   Good Recent;   Good  Judgement:  Fair  Insight:   Fair  Psychomotor Activity:  Normal  Concentration:  Concentration: Good and Attention Span: Good  Recall:  Good  Fund of Knowledge:  Good  Language:  Good  Akathisia:  No  Handed:  Right  AIMS (if indicated):     Assets:  Communication Skills Social Support  ADL's:  Intact  Cognition:  WNL  Sleep:  Number of Hours: 6     Treatment Plan Summary: Daily contact with patient to assess and evaluate symptoms and progress in treatment, Medication management and Plan is to:  -Continue Cymbalta 30 mg PO Daily for mood control -Continue Gabapentin 100 mg PO TID for withdrawal symptoms -Continue Ativan CIWA protocol -Continue Trazodone 50 mg PO QHS PRN for insomnia -Encourage group therapy participation  Maryfrances Bunnell, FNP 03/25/2017, 12:52 PM   Agree with NP Progress Note

## 2017-03-25 NOTE — Progress Notes (Signed)
Patient walked back by staff from the dining room at 1245. Observed clutching stomach and states, "I'm weak. I'm dizzy. I think I'm having a panic attack. Can you do an EKG? I feel nauseated but I still want my lunch tray." Patient assisted to his room and instructed to rest in bed. VS obtained and WDL (see doc flowsheets). Medicated with zofran and ginger ale provided. Patient reminded of fall prevention plan. T Money, NP notified of above. Patient now observed coloring in dayroom. Continues to present incongruently from self report. Remains safe at present.

## 2017-03-25 NOTE — Progress Notes (Signed)
Pt requested residential treatment referral. [facility] referral made 03/25/2017 3:58 PM   Trula SladeHeather Smart, MSW, LCSW Clinical Social Worker 03/25/2017 3:58 PM

## 2017-03-25 NOTE — Progress Notes (Addendum)
[facility] Group Notes:  (Nursing/MHT/Case Management/Adjunct)  Date:  03/24/2017  Time: 2045  Type of Therapy:  wrap up group  Participation Level:  Active  Participation Quality:  Appropriate and Sharing  Affect:  Anxious and Irritable  Cognitive:  Appropriate  Insight:  Lacking  Engagement in Group:  Limited  Modes of Intervention:  Clarification, Education and Support  Summary of Progress/Problems: Pt shared that nothing was better than yesterday, anxiety and back pain still extreme. Pt shared if he could change any one thing in his life it would be to have never gotten arrested. In response to needs vs wants, pt reported needing stability and money and wanting something to call his own. Pt reports not being depressed and anxious all the time would better himself.   Johann CapersMcNeil, Corrina Steffensen S 03/25/2017, 1:45 AM

## 2017-03-25 NOTE — Progress Notes (Signed)
Nutrition Education Note  Pt attended group focusing on general, healthful nutrition education.  RD emphasized the importance of eating regular meals and snacks throughout the day. Consuming sugar-free beverages and incorporating fruits and vegetables into diet when possible. Provided examples of healthy snacks. Patient encouraged to leave group with a goal to improve nutrition/healthy eating.   Diet Order: Diet regular Room service appropriate? Yes; Fluid consistency: Thin Pt is also offered choice of unit snacks mid-morning and mid-afternoon.  Pt is eating as desired.   If additional nutrition issues arise, please consult RD.    Nalina Yeatman, MS, RD, LDN, CNSC Inpatient Clinical Dietitian Pager # [phone] After hours/weekend pager # [phone]     

## 2017-03-25 NOTE — Progress Notes (Signed)
Patient attended group and said that her day was a 4. Her coping skills for today were writing and playing cards.

## 2017-03-26 LAB — HEPATITIS B SURFACE ANTIGEN: HEP B S AG: NEGATIVE

## 2017-03-26 LAB — HEPATITIS C ANTIBODY: HCV Ab: <0.1 s/co ratio (ref 0.0–0.9)

## 2017-03-26 MED ORDER — GABAPENTIN 300 MG PO CAPS
300.0000 mg | ORAL_CAPSULE | Freq: Three times a day (TID) | ORAL | Status: DC
  Administered 2017-03-26 – 2017-03-29 (×10): 300 mg via ORAL
  Filled 2017-03-26 (×12): qty 1

## 2017-03-26 NOTE — Progress Notes (Signed)
The patient attended the A.A. Meeting and was appropriate. After returning from the group, he stated that he felt depressed since he was thinking about his long history of substance abuse. He expressed his understanding of maintaining his sobriety and receiving additional help as well.

## 2017-03-26 NOTE — Progress Notes (Signed)
D    Pt endorses depression and anxiety    He became more depressed after his AA group this evening   He has a poor memory and trouble concentrating on what is being discussed and frequently asks for clarification     He interacts with select peers  A   Verbal support given   Medications administered and effectiveness monitored   Q 15 min checks  R   Pt is safe at present time

## 2017-03-26 NOTE — Progress Notes (Signed)
Patient [facility] at [facility]:00[facility] [phone]. Possible bed early next week.  Trula SladeHeather Smart, MSW, LCSW Clinical Social Worker 03/26/2017 12:51 PM

## 2017-03-26 NOTE — Progress Notes (Signed)
D: Patient denies SI/HI or AVH. Patient has a anxious, attention seeking and irritable.  Pt. Has been up and participating in group.  Pt. Is preoccupied with his medication, continuously questioning this RN about "whats due and what can I have".  Pt. Has gone outside and is visualized interacting with staff and others on the unit.  Pt. Does intermittently require redirection.  A: Patient given emotional support from RN. Patient encouraged to come to staff with concerns and/or questions. Patient's medication routine continued. Patient's orders and plan of care reviewed.   R: Patient remains appropriate and cooperative. Will continue to monitor patient q15 minutes for safety.

## 2017-03-26 NOTE — Tx Team (Signed)
Interdisciplinary Treatment and Diagnostic Plan Update  03/26/2017 Time of Session: 0830AM Marc [name] MRN: [id number]  Principal Diagnosis: Bipolar disorder, unspecified (HCC)  Secondary Diagnoses: Principal Problem:   Bipolar disorder, unspecified (HCC) Active Problems:   Severe episode of recurrent major depressive disorder, without psychotic features (HCC)   Current Medications:  Current Facility-Administered Medications  Medication Dose Route Frequency Provider Last Rate Last Dose  . acetaminophen (TYLENOL) tablet 650 mg  650 mg Oral Q4H PRN Okonkwo, Justina A, NP   650 mg at 03/25/17 1635  . alum & mag hydroxide-simeth (MAALOX/MYLANTA) 200-200-20 MG/5ML suspension 30 mL  30 mL Oral Q4H PRN Okonkwo, Justina A, NP      . DULoxetine (CYMBALTA) DR capsule 40 mg  40 mg Oral Daily Cobos, Fernando A, MD      . gabapentin (NEURONTIN) capsule 200 mg  200 mg Oral BID Cobos, Fernando A, MD      . hydrOXYzine (ATARAX/VISTARIL) tablet 25 mg  25 mg Oral Q6H PRN Cobos, Rockey Situ, MD   25 mg at 03/25/17 1818  . ibuprofen (ADVIL,MOTRIN) tablet 600 mg  600 mg Oral Q6H PRN Nira Conn A, NP   600 mg at 03/25/17 2110  . loperamide (IMODIUM) capsule 2-4 mg  2-4 mg Oral PRN Cobos, Rockey Situ, MD      . magnesium hydroxide (MILK OF MAGNESIA) suspension 30 mL  30 mL Oral Daily PRN Okonkwo, Justina A, NP   30 mL at 03/25/17 1858  . methocarbamol (ROBAXIN) tablet 750 mg  750 mg Oral Q6H PRN Nira Conn A, NP   750 mg at 03/25/17 2110  . multivitamin with minerals tablet 1 tablet  1 tablet Oral Daily Cobos, Rockey Situ, MD   1 tablet at 03/25/17 0803  . neomycin-bacitracin-polymyxin (NEOSPORIN) ointment   Topical BID Cobos, Fernando A, MD      . nicotine (NICODERM CQ - dosed in mg/24 hours) patch 21 mg  21 mg Transdermal Daily Cobos, Rockey Situ, MD   21 mg at 03/25/17 0803  . ondansetron (ZOFRAN-ODT) disintegrating tablet 4 mg  4 mg Oral Q6H PRN Cobos, Rockey Situ, MD   4 mg at 03/25/17 1239  .  thiamine (B-1) injection 100 mg  100 mg Intramuscular Once Cobos, Fernando A, MD      . thiamine (VITAMIN B-1) tablet 100 mg  100 mg Oral Daily Cobos, Rockey Situ, MD   100 mg at 03/25/17 0803  . traZODone (DESYREL) tablet 50 mg  50 mg Oral QHS PRN Cobos, Rockey Situ, MD   50 mg at 03/25/17 2148   PTA Medications: Prescriptions Prior to Admission  Medication Sig Dispense Refill Last Dose  . albuterol (PROVENTIL HFA;VENTOLIN HFA) 108 (90 BASE) MCG/ACT inhaler Inhale 2 puffs into the lungs every 6 (six) hours as needed for wheezing.   Past Month at Unknown time  . ALPRAZolam (XANAX) 0.25 MG tablet Take 1 tablet (0.25 mg total) by mouth 3 (three) times daily. (Patient not taking: Reported on 03/21/2017) 21 tablet 0 Not Taking at Unknown time  . citalopram (CELEXA) 20 MG tablet Take 20 mg by mouth daily.   03/21/2017 at Unknown time  . traZODone (DESYREL) 100 MG tablet Take 100 mg by mouth at bedtime.   03/20/2017 at Unknown time    Patient Stressors: Educational concerns Financial difficulties Loss of family members Substance abuse  Patient Strengths: Barrister's clerk for treatment/growth Physical Health  Treatment Modalities: Medication Management, Group therapy, Case management,  1 to 1 session  with clinician, Psychoeducation, Recreational therapy.   Physician Treatment Plan for Primary Diagnosis: Bipolar disorder, unspecified (HCC) Long Term Goal(s): Improvement in symptoms so as ready for discharge Improvement in symptoms so as ready for discharge   Short Term Goals: Ability to verbalize feelings will improve Ability to disclose and discuss suicidal ideas Ability to maintain clinical measurements within normal limits will improve Compliance with prescribed medications will improve  Medication Management: Evaluate patient's response, side effects, and tolerance of medication regimen.  Therapeutic Interventions: 1 to 1 sessions, Unit Group sessions and Medication  administration.  Evaluation of Outcomes: Progressing  Physician Treatment Plan for Secondary Diagnosis: Principal Problem:   Bipolar disorder, unspecified (HCC) Active Problems:   Severe episode of recurrent major depressive disorder, without psychotic features (HCC)  Long Term Goal(s): Improvement in symptoms so as ready for discharge Improvement in symptoms so as ready for discharge   Short Term Goals: Ability to verbalize feelings will improve Ability to disclose and discuss suicidal ideas Ability to maintain clinical measurements within normal limits will improve Compliance with prescribed medications will improve     Medication Management: Evaluate patient's response, side effects, and tolerance of medication regimen.  Therapeutic Interventions: 1 to 1 sessions, Unit Group sessions and Medication administration.  Evaluation of Outcomes: Progressing   RN Treatment Plan for Primary Diagnosis: Bipolar disorder, unspecified (HCC) Long Term Goal(s): Knowledge of disease and therapeutic regimen to maintain health will improve  Short Term Goals: Ability to remain free from injury will improve, Ability to verbalize feelings will improve and Ability to disclose and discuss suicidal ideas  Medication Management: RN will administer medications as ordered by provider, will assess and evaluate patient's response and provide education to patient for prescribed medication. RN will report any adverse and/or side effects to prescribing provider.  Therapeutic Interventions: 1 on 1 counseling sessions, Psychoeducation, Medication administration, Evaluate responses to treatment, Monitor vital signs and CBGs as ordered, Perform/monitor CIWA, COWS, AIMS and Fall Risk screenings as ordered, Perform wound care treatments as ordered.  Evaluation of Outcomes: Progressing   LCSW Treatment Plan for Primary Diagnosis: Bipolar disorder, unspecified (HCC) Long Term Goal(s): Safe transition to appropriate  next level of care at discharge, Engage patient in therapeutic group addressing interpersonal concerns.  Short Term Goals: Engage patient in aftercare planning with referrals and resources, Facilitate patient progression through stages of change regarding substance use diagnoses and concerns and Identify triggers associated with mental health/substance abuse issues  Therapeutic Interventions: Assess for all discharge needs, 1 to 1 time with Social worker, Explore available resources and support systems, Assess for adequacy in community support network, Educate family and significant other(s) on suicide prevention, Complete Psychosocial Assessment, Interpersonal group therapy.  Evaluation of Outcomes: Progressing   Progress in Treatment: Attending groups: Yes. Participating in groups: Yes. Taking medication as prescribed: Yes. Toleration medication: Yes. Family/Significant other contact made: No, will contact:  pt's grandmother for collateral information and to complete SPE. Patient understands diagnosis: Yes. Discussing patient identified problems/goals with staff: Yes. Medical problems stabilized or resolved: Yes. Denies suicidal/homicidal ideation: Yes. Issues/concerns per patient self-inventory:  Yes Other: Pt continues to rate pain and anxiety as high; observed laughing and playing cards with peers last evening--presentation not congruent with pt's somatic complaints.   New problem(s) identified: No, Describe:  n/a  New Short Term/Long Term Goal(s): medication management for mood stabilization; development of comprehensive mental wellness/sobriety plan.   Patient Goal: "To get my anxiety down and start feeling better."   Discharge Plan or  Barriers: [facility]. He plans to return home with grandmother and [facility]. PT given [facility] pamphlet and [place]  support.   Reason for Continuation of Hospitalization: Anxiety Medication stabilization Withdrawal symptoms  Estimated Length of Stay: Tuesday, 03/30/17  Attendees: Patient: 03/26/2017 8:18 AM  Physician: Dr. Jama Flavorsobos MD 03/26/2017 8:18 AM  Nursing: Terrall LaityJane, Dan RN 03/26/2017 8:18 AM  RN Care Manager: Onnie BoerJennifer Clark CM 03/26/2017 8:18 AM  Social Worker: Trula SladeHeather Smart, LCSW 03/26/2017 8:18 AM  Recreational Therapist: x 03/26/2017 8:18 AM  Other: Armandina StammerAgnes Nwoko NP; Feliz Beamravis Money NP 03/26/2017 8:18 AM  Other:  03/26/2017 8:18 AM  Other: 03/26/2017 8:18 AM    Scribe for Treatment Team: Ledell PeoplesHeather N Smart, LCSW 03/26/2017 8:18 AM

## 2017-03-26 NOTE — Progress Notes (Signed)
[facility] MD Progress Note  03/26/2017 10:24 AM Marc [name]  MRN:  [id number]   Subjective:  Patient reports partial improvement compared to admission. However, he continues to report significant anxiety, some depression. Denies suicidal ideations today and is future oriented,hoping to go to a rehab on discharge. Tends to ruminate about poor social support system, but affect responds to support, empathy.   Objective: I have discussed case with treatment team and have met with patient. As above, presents partially improved, still anxious and ruminative, but with a more reactive affect and less irritability. He denies suicidal ideations at this time. As discussed with staff, patient was found to be trying to divert BZD yesterday ( put pill in his pocket). He was confronted by staff, and we have discussed it. Presents apologetic about this, states he was trying to " save dose for later ".  He is now off BZDs and is not presenting with  current WDL symptoms- no tremors, no diaphoresis, less restlessness . Currently on Neurontin and on Vistaril PRNs for management of anxiety. Visible on unit, going to some groups. Today presents future oriented, reporting hope that he can get into a rehab setting at discharge Labs reviewed- STD work up negative, patient does not endorse symptoms.  Principal Problem: Bipolar disorder, unspecified ([place]) Diagnosis:   Patient Active Problem List   Diagnosis Date Noted  . Severe episode of recurrent major depressive disorder, without psychotic features ([place]) [F33.2]   . Bipolar disorder, unspecified ([place]) [F31.9] 03/23/2017  . Depression, major, recurrent, in partial remission ([place]) [F33.41] 06/03/2011  . ELBOW SPRAIN, LEFT [IMO0002] 09/17/2010   Total Time spent with patient: 20 minutes  Past Psychiatric History: See H&P  Past Medical History:  Past Medical History:  Diagnosis Date  . Anxiety   . Asthma   . Depressed   . Polysubstance abuse   . Wears  glasses     Past Surgical History:  Procedure Laterality Date  . COLON SURGERY    . FRACTURE SURGERY     Family History:  Family History  Problem Relation Age of Onset  . Anxiety disorder Father   . Depression Father   . Anxiety disorder Paternal Grandfather   . Anxiety disorder Mother   . Depression Mother   . Depression Cousin   . Drug abuse Cousin    Family Psychiatric  History: See H&P Social History:  History  Alcohol Use  . Yes    Comment: "80oz everyday"      History  Drug Use  . Types: Marijuana    Comment: last used today 03/21/17    Social History   Social History  . Marital status: Single    Spouse name: N/A  . Number of children: N/A  . Years of education: N/A   Social History Main Topics  . Smoking status: Current Every Day Smoker    Packs/day: 2.00    Types: Cigarettes  . Smokeless tobacco: Never Used  . Alcohol use Yes     Comment: "80oz everyday"   . Drug use: Yes    Types: Marijuana     Comment: last used today 03/21/17  . Sexual activity: No   Other Topics Concern  . None   Social History Narrative  . None   Additional Social History:   Sleep: Good  Appetite:  Good  Current Medications: Current Facility-Administered Medications  Medication Dose Route Frequency Provider Last Rate Last Dose  . acetaminophen (TYLENOL) tablet 650 mg  650 mg Oral  Q4H PRN Hughie Closs A, NP   650 mg at 03/25/17 1635  . alum & mag hydroxide-simeth (MAALOX/MYLANTA) 200-200-20 MG/5ML suspension 30 mL  30 mL Oral Q4H PRN Okonkwo, Justina A, NP      . DULoxetine (CYMBALTA) DR capsule 40 mg  40 mg Oral Daily Cobos, Myer Peer, MD   40 mg at 03/26/17 0847  . gabapentin (NEURONTIN) capsule 300 mg  300 mg Oral TID Cobos, Fernando A, MD      . hydrOXYzine (ATARAX/VISTARIL) tablet 25 mg  25 mg Oral Q6H PRN Cobos, Myer Peer, MD   25 mg at 03/26/17 0846  . ibuprofen (ADVIL,MOTRIN) tablet 600 mg  600 mg Oral Q6H PRN Lindon Romp A, NP   600 mg at 03/25/17 2110  .  loperamide (IMODIUM) capsule 2-4 mg  2-4 mg Oral PRN Cobos, Myer Peer, MD      . magnesium hydroxide (MILK OF MAGNESIA) suspension 30 mL  30 mL Oral Daily PRN Okonkwo, Justina A, NP   30 mL at 03/25/17 1858  . methocarbamol (ROBAXIN) tablet 750 mg  750 mg Oral Q6H PRN Lindon Romp A, NP   750 mg at 03/25/17 2110  . multivitamin with minerals tablet 1 tablet  1 tablet Oral Daily Cobos, Myer Peer, MD   1 tablet at 03/26/17 0848  . neomycin-bacitracin-polymyxin (NEOSPORIN) ointment   Topical BID Cobos, Myer Peer, MD   1 application at 27/[birth year] [phone]  . nicotine (NICODERM CQ - dosed in mg/24 hours) patch 21 mg  21 mg Transdermal Daily Cobos, Myer Peer, MD   21 mg at 03/26/17 0849  . ondansetron (ZOFRAN-ODT) disintegrating tablet 4 mg  4 mg Oral Q6H PRN Cobos, Myer Peer, MD   4 mg at 03/25/17 1239  . thiamine (B-1) injection 100 mg  100 mg Intramuscular Once Cobos, Fernando A, MD      . thiamine (VITAMIN B-1) tablet 100 mg  100 mg Oral Daily Cobos, Myer Peer, MD   100 mg at 03/26/17 0848  . traZODone (DESYREL) tablet 50 mg  50 mg Oral QHS PRN Cobos, Myer Peer, MD   50 mg at 03/25/17 2148    Lab Results:  Results for orders placed or performed during the hospital encounter of 03/23/17 (from the past 48 hour(s))  HIV antibody     Status: None   Collection Time: 03/25/17  6:28 AM  Result Value Ref Range   HIV Screen 4th Generation wRfx Non Reactive Non Reactive    Comment: (NOTE) Performed At: [facility] [place], [place] [id number] Lindon Romp MD [id number] Performed at [facility], [place] [address] [place], [place] [address]   Hepatitis B surface antigen     Status: None   Collection Time: 03/25/17  6:28 AM  Result Value Ref Range   Hepatitis B Surface Ag Negative Negative    Comment: (NOTE) Performed At: [facility] [place], [place] [id number] Lindon Romp MD [id number] Performed at [facility], [place] [address], [place], [place] [address]   Hepatitis C antibody     Status: None   Collection Time: 03/25/17  6:28 AM  Result Value Ref Range   HCV Ab <0.1 0.0 - 0.9 s/co ratio    Comment: (NOTE)                                  Negative:     <  0.8                             Indeterminate: 0.8 - 0.9                                  Positive:     > 0.9 The CDC recommends that a positive HCV antibody result be followed up with a HCV Nucleic Acid Amplification test (588502). Performed At: [facility] [place], [place] [id number] Lindon Romp MD [id number] Performed at [facility], [place] [address], [place], [place] [address]   RPR     Status: None   Collection Time: 03/25/17  6:28 AM  Result Value Ref Range   RPR Ser Ql Non Reactive Non Reactive    Comment: (NOTE) Performed At: [facility] [place], [place] [id number] Lindon Romp MD [id number] Performed at [facility], [place] [address], [place], [place] [address]     Blood Alcohol level:  Lab Results  Component Value Date   ETH <5 49/44/9675    Metabolic Disorder Labs: Lab Results  Component Value Date   HGBA1C 5.6 04/28/2011   MPG 114 04/28/2011   No results found for: PROLACTIN Lab Results  Component Value Date   CHOL 140 04/28/2011   TRIG 55 04/28/2011   HDL 48 04/28/2011   CHOLHDL 2.9 04/28/2011   VLDL 11 04/28/2011   LDLCALC 81 04/28/2011    Physical Findings: AIMS: Facial and Oral Movements Muscles of Facial Expression: None, normal Lips and Perioral Area: None, normal Jaw: None, normal Tongue: None, normal,Extremity Movements Upper (arms, wrists, hands, fingers): None, normal Lower (legs, knees, ankles, toes): None, normal, Trunk Movements Neck, shoulders, hips: None, normal, Overall Severity Severity of abnormal movements (highest score from questions above): None,  normal Incapacitation due to abnormal movements: None, normal Patient's awareness of abnormal movements (rate only patient's report): No Awareness, Dental Status Current problems with teeth and/or dentures?: No Does patient usually wear dentures?: No  CIWA:  CIWA-Ar Total: 6 COWS:     Musculoskeletal: Strength & Muscle Tone: within normal limits Gait & Station: normal Patient leans: N/A  Psychiatric Specialty Exam: Physical Exam  Nursing note and vitals reviewed. Constitutional: He is oriented to person, place, and time. He appears well-developed and well-nourished.  Cardiovascular: Normal rate.   Respiratory: Effort normal.  Musculoskeletal: Normal range of motion.  Neurological: He is oriented to person, place, and time.  Skin: Skin is warm.    Review of Systems  Constitutional: Negative.   HENT: Negative.   Eyes: Negative.   Respiratory: Negative.   Cardiovascular: Negative.   Gastrointestinal: Negative.   Genitourinary: Negative.   Musculoskeletal: Positive for back pain.  Skin: Negative.   Neurological: Negative.   Endo/Heme/Allergies: Negative.   no chest pain, no dyspnea, no vomiting   Blood pressure 97/64, pulse (!) 109, temperature 98 F (36.7 C), temperature source Oral, resp. rate 16, height 5' 5" (1.651 m), weight 55.3 kg (122 lb), SpO2 99 %.Body mass index is 20.3 kg/m.  General Appearance: Fairly Groomed  Eye Contact:  Good  Speech:  Normal Rate  Volume:  Normal  Mood:  less depressed today  Affect:  still anxious, but less constricted, less irritable  Thought Process:  Linear and Descriptions of Associations: Intact  Orientation:  Full (Time, Place, and Person)  Thought Content:  No hallucinations , no delusions, not internally preoccupied, remains somatically focused   Suicidal Thoughts:  No denies suicidal or self injurious ideations   Homicidal Thoughts:  No denies   Memory:  Recent and remote grossly intact   Judgement:  Fair  Insight:  Fair   Psychomotor Activity:  Normal  Concentration:  Concentration: Good and Attention Span: Good  Recall:  Good  Fund of Knowledge:  Good  Language:  Good  Akathisia:  No  Handed:  Right  AIMS (if indicated):     Assets:  Communication Skills Social Support  ADL's:  Intact  Cognition:  WNL  Sleep:  Number of Hours: 6.75   Assessment - patient is presenting with partial improvement compared to admission. At this time remains anxious, but is less dysphoric, less irritable, and with a more reactive affect. No suicidal ideations at this time and is future oriented, wanting to go to rehab.   Treatment Plan Summary: Treatment plan reviewed as below today 9/7 Daily contact with patient to assess and evaluate symptoms and progress in treatment, Medication management and Plan is to:  -Encourage group and milieu participation to work on coping skills and symptom reduction -Encourage efforts to work on sobriety and relapse prevention -Increase Cymbalta to 40  mg PO Daily for mood control -Increase  Gabapentin to 300 mg PO TID for anxiety, back pain -Continue Trazodone 50 mg PO QHS PRN for insomnia -Continue Vistaril 25 mgrs Q 6 hours PRN for anxiety as needed  -Treatment team working on disposition planning - patient expressing interest in going to a rehab at discharge  Jenne Campus, MD 03/26/2017, 10:24 AMPatient ID: Marc [name], male   DOB: 1996/05/07, 21 y.o.   MRN: [id number]

## 2017-03-26 NOTE — Progress Notes (Signed)
Pt is more appropriate and apologetic this evening for his behaviors last evening.  He is still c/o severe back pain and anxiety.  He understands that he does not have the Ativan available d/t the incident earlier where he slipped an Ativan in his pocket.  He denies SI/HI/AVH.  He has been observed sitting in the dayroom talking and laughing with peers and playing cards.  He seems to be relaxed and moving freely, but when he come to talk with Clinical research associatewriter, he says that his pain is so severe that he can hardly move.  He says that the doctor increased his Neurontin today and asks if he can receive a dose of the med tonight to reflect the increase with the hopes that it will help his back pain. Writer spoke with the provider who allowed pt to receive 200 mg of Neurontin at hs.  Pt had also received Robaxin and Motrin earlier at shift change for his back pain.  Support and encouragement offered.  Discharge plans are in process and a referral to [facility].  Safety maintained with q15 minute checks.

## 2017-03-26 NOTE — [facility] Group Notes (Signed)
LCSW Group Therapy Note  03/26/2017 1:15pm  Type of Therapy and Topic:  Group Therapy:  Feelings around Relapse and Recovery  Participation Level:  Active   Description of Group:    Patients in this group will discuss emotions they experience before and after a relapse. They will process how experiencing these feelings, or avoidance of experiencing them, relates to having a relapse. Facilitator will guide patients to explore emotions they have related to recovery. Patients will be encouraged to process which emotions are more powerful. They will be guided to discuss the emotional reaction significant others in their lives may have to their relapse or recovery. Patients will be assisted in exploring ways to respond to the emotions of others without this contributing to a relapse.  Therapeutic Goals: 1. Patient will identify two or more emotions that lead to a relapse for them 2. Patient will identify two emotions that result when they relapse 3. Patient will identify two emotions related to recovery 4. Patient will demonstrate ability to communicate their needs through discussion and/or role plays   Summary of Patient Progress:  Marc [name] was attentive, intrusive, and monopolizing in the group setting. He shared that he relapsed when dating a "toxic girl" who gave him pills and cut herself. Patient continued to interrupt others and required frequent redirection. Patient focused on medications and anxiety. Patient anxious and tearful at times. He continues to struggle in the group setting due to intrusiveness and limited insight.    Therapeutic Modalities:   Cognitive Behavioral Therapy Solution-Focused Therapy Assertiveness Training Relapse Prevention Therapy   Ledell PeoplesHeather N Smart, LCSW 03/26/2017 1:07 PM

## 2017-03-26 NOTE — Progress Notes (Signed)
Adult Psychoeducational Group Note  Date:  03/26/2017 Time:  4:06 PM  Group Topic/Focus:  Building Self Esteem:   The Focus of this group is helping patients become aware of the effects of self-esteem on their lives, the things they and others do that enhance or undermine their self-esteem, seeing the relationship between their level of self-esteem and the choices they make and learning ways to enhance self-esteem.  Participation Level:  Active  Participation Quality:  Appropriate  Affect:  Appropriate  Cognitive:  Alert  Insight: Appropriate  Engagement in Group:  Engaged  Modes of Intervention:  Activity  Additional Comments:  Pt did participate in all group activities and discussions today.  Tifani Dack R Makana Rostad 03/26/2017, 4:06 PM

## 2017-03-27 MED ORDER — HYDROXYZINE HCL 25 MG PO TABS
25.0000 mg | ORAL_TABLET | Freq: Three times a day (TID) | ORAL | Status: DC | PRN
  Administered 2017-03-27 – 2017-03-28 (×3): 25 mg via ORAL
  Filled 2017-03-27 (×2): qty 1

## 2017-03-27 MED ORDER — DULOXETINE HCL 60 MG PO CPEP
60.0000 mg | ORAL_CAPSULE | Freq: Every day | ORAL | Status: DC
  Administered 2017-03-28 – 2017-03-29 (×2): 60 mg via ORAL
  Filled 2017-03-27 (×3): qty 1

## 2017-03-27 NOTE — [facility] Group Notes (Signed)
   Date:  03/27/2017  Time:  1100  Type of Therapy:  Nurse Education  /  The group focuses on teaching patients hoe to identify their needs and then how to develop the skills needed to get them met.  Participation Level:  Active  Participation Quality:  Attentive  Affect:  Anxious  Cognitive:  Alert  Insight:  Improving  Engagement in Group:  Engaged  Modes of Intervention:  Education  Summary of Progress/Problems:  Lauralyn Primes 03/27/2017, 3:10 PM

## 2017-03-27 NOTE — [facility] Group Notes (Signed)
[facility]  Date/Time:    03/27/2017 10:00-11:00AM  Type of Therapy and Topic:  Group Therapy:  Healthy vs Unhealthy Coping Skills  Participation Level:  Active    Description of Group:  The focus of this group was to determine what unhealthy coping techniques typically are used by group members and what healthy coping techniques would be helpful in coping with various problems. Patients were guided in becoming aware of the differences between healthy and unhealthy coping techniques.  "Benefits" and "Costs" of a number of coping skills were evaluated by the group, including isolation, cutting, drinking/using drugs, exercising, talking things out, and taking out anger on a pillow.    Therapeutic Goals 1. Patients learned that coping is what human beings do all day long to deal with various situations in their lives 2. Patients defined and discussed healthy vs unhealthy coping techniques 3. Patients identified their preferred coping techniques and identified whether these were healthy or unhealthy 4. 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  He was very restless, kept getting up and down and intruding into group to retrieve coloring pencils from across the room.  He was resistant but pleasant about moving to share the pencils with another group member in order to be less disruptive, continued to have to be redirected from side conversations.   Therapeutic Modalities Cognitive Behavioral Therapy Motivational Interviewing   Ambrose MantleMareida Grossman-Orr, LCSW 03/27/2017, 3:40 PM

## 2017-03-27 NOTE — Progress Notes (Signed)
[facility] Group Notes:  (Nursing/MHT/Case Management/Adjunct)  Date:  03/27/2017  Time:  11:22 PM  Type of Therapy:  Psychoeducational Skills  Participation Level:  Active  Participation Quality:  Appropriate  Affect:  Appropriate  Cognitive:  Appropriate  Insight:  Appropriate  Engagement in Group:  Engaged  Modes of Intervention:  Education  Summary of Progress/Problems:Patient expressed in group that he had a good day and that his mood felt elevated as compared to yesterday. He attributes his positive day to having had a long talk with his nurse. In terms of the theme for the day, his support system will consist of going skateboarding and writing.   Marc [name] 03/27/2017, 11:22 PM

## 2017-03-27 NOTE — Progress Notes (Signed)
D   Pt reports a better day today because his nurse Patty helped him   His mood has improved and he reports less anxiety   He has been interactive with his peers and treatment compliant A   Verbal support given   Medications administered and effectiveness monitored   Q 15 min checks R   Pt safe at present time and receptive to verbal support

## 2017-03-27 NOTE — Progress Notes (Signed)
[facility]  03/27/2017 1:18 PM [name] A Coger  MRN:  [id number]   Subjective:  Patient reports significant improvement today. Reports feeling really good today. Denies SI/HI/AVH and is future oriented on discharge.    Objective: Patient is pleasant and cooperative. He is doing well today and is seen in the day room playing games and interacting with others appropriately. Will increase his Cymbalta to 60 mg PO Daily. He is not seen having as many complaints today and hasn't complained about anxiety or pain to me today. Only concern is he wants to discharge when he has a bed at a facility, which may not be possible.   Principal Problem: Bipolar disorder, unspecified (HCC) Diagnosis:   Patient Active Problem List   Diagnosis Date Noted  . Severe episode of recurrent major depressive disorder, without psychotic features (HCC) [F33.2]   . Bipolar disorder, unspecified (HCC) [F31.9] 03/23/2017  . Depression, major, recurrent, in partial remission (HCC) [F33.41] 06/03/2011  . ELBOW SPRAIN, LEFT [IMO0002] 09/17/2010   Total Time spent with patient: 25 minutes  Past Psychiatric History: See H&P  Past Medical History:  Past Medical History:  Diagnosis Date  . Anxiety   . Asthma   . Depressed   . Polysubstance abuse   . Wears glasses     Past Surgical History:  Procedure Laterality Date  . COLON SURGERY    . FRACTURE SURGERY     Family History:  Family History  Problem Relation Age of Onset  . Anxiety disorder Father   . Depression Father   . Anxiety disorder Paternal Grandfather   . Anxiety disorder Mother   . Depression Mother   . Depression Cousin   . Drug abuse Cousin    Family Psychiatric  History: See H&P Social History:  History  Alcohol Use  . Yes    Comment: "80oz everyday"      History  Drug Use  . Types: Marijuana    Comment: last used today 03/21/17    Social History   Social History  . Marital status: Single    Spouse name: N/A  . Number  of children: N/A  . Years of education: N/A   Social History Main Topics  . Smoking status: Current Every Day Smoker    Packs/day: 2.00    Types: Cigarettes  . Smokeless tobacco: Never Used  . Alcohol use Yes     Comment: "80oz everyday"   . Drug use: Yes    Types: Marijuana     Comment: last used today 03/21/17  . Sexual activity: No   Other Topics Concern  . None   Social History Narrative  . None   Additional Social History:   Sleep: Good  Appetite:  Good  Current Medications: Current Facility-Administered Medications  Medication Dose Route Frequency Provider Last Rate Last Dose  . acetaminophen (TYLENOL) tablet 650 mg  650 mg Oral Q4H PRN Okonkwo, Justina A, NP   650 mg at 03/25/17 1635  . alum & mag hydroxide-simeth (MAALOX/MYLANTA) 200-200-20 MG/5ML suspension 30 mL  30 mL Oral Q4H PRN Beryle Lathekonkwo, Justina A, NP      . [START ON 03/28/2017] DULoxetine (CYMBALTA) DR capsule 60 mg  60 mg Oral Daily Marnisha Stampley B, FNP      . gabapentin (NEURONTIN) capsule 300 mg  300 mg Oral TID Cobos, Rockey SituFernando A, MD   300 mg at 03/27/17 1253  . hydrOXYzine (ATARAX/VISTARIL) tablet 25 mg  25 mg Oral Q6H PRN Cobos, Rockey SituFernando A,  MD   25 mg at 03/27/17 0800  . ibuprofen (ADVIL,MOTRIN) tablet 600 mg  600 mg Oral Q6H PRN Nira Conn A, NP   600 mg at 03/27/17 1026  . loperamide (IMODIUM) capsule 2-4 mg  2-4 mg Oral PRN Cobos, Rockey Situ, MD      . magnesium hydroxide (MILK OF MAGNESIA) suspension 30 mL  30 mL Oral Daily PRN Okonkwo, Justina A, NP   30 mL at 03/25/17 1858  . methocarbamol (ROBAXIN) tablet 750 mg  750 mg Oral Q6H PRN Nira Conn A, NP   750 mg at 03/26/17 2142  . multivitamin with minerals tablet 1 tablet  1 tablet Oral Daily Cobos, Rockey Situ, MD   1 tablet at 03/27/17 0756  . neomycin-bacitracin-polymyxin (NEOSPORIN) ointment   Topical BID Cobos, Rockey Situ, MD   1 application at 03/27/17 0800  . nicotine (NICODERM CQ - dosed in mg/24 hours) patch 21 mg  21 mg Transdermal Daily  Cobos, Rockey Situ, MD   21 mg at 03/27/17 0756  . ondansetron (ZOFRAN-ODT) disintegrating tablet 4 mg  4 mg Oral Q6H PRN Cobos, Rockey Situ, MD   4 mg at 03/25/17 1239  . thiamine (B-1) injection 100 mg  100 mg Intramuscular Once Cobos, Fernando A, MD      . thiamine (VITAMIN B-1) tablet 100 mg  100 mg Oral Daily Cobos, Rockey Situ, MD   100 mg at 03/27/17 0756  . traZODone (DESYREL) tablet 50 mg  50 mg Oral QHS PRN Cobos, Rockey Situ, MD   50 mg at 03/26/17 2142    Lab Results:  No results found for this or any previous visit (from the past 48 hour(s)).  Blood Alcohol level:  Lab Results  Component Value Date   ETH <5 03/21/2017    Metabolic Disorder Labs: Lab Results  Component Value Date   HGBA1C 5.6 04/28/2011   MPG 114 04/28/2011   No results found for: PROLACTIN Lab Results  Component Value Date   CHOL 140 04/28/2011   TRIG 55 04/28/2011   HDL 48 04/28/2011   CHOLHDL 2.9 04/28/2011   VLDL 11 04/28/2011   LDLCALC 81 04/28/2011    Physical Findings: AIMS: Facial and Oral Movements Muscles of Facial Expression: None, normal Lips and Perioral Area: None, normal Jaw: None, normal Tongue: None, normal,Extremity Movements Upper (arms, wrists, hands, fingers): None, normal Lower (legs, knees, ankles, toes): None, normal, Trunk Movements Neck, shoulders, hips: None, normal, Overall Severity Severity of abnormal movements (highest score from questions above): None, normal Incapacitation due to abnormal movements: None, normal Patient's awareness of abnormal movements (rate only patient's report): No Awareness, Dental Status Current problems with teeth and/or dentures?: No Does patient usually wear dentures?: No  CIWA:  CIWA-Ar Total: 3 COWS:     Musculoskeletal: Strength & Muscle Tone: within normal limits Gait & Station: normal Patient leans: N/A  Psychiatric Specialty Exam: Physical Exam  Nursing note and vitals reviewed. Constitutional: He is oriented to person,  place, and time. He appears well-developed and well-nourished.  Cardiovascular: Normal rate.   Respiratory: Effort normal.  Musculoskeletal: Normal range of motion.  Neurological: He is alert and oriented to person, place, and time.  Skin: Skin is warm.    Review of Systems  Constitutional: Negative.   HENT: Negative.   Eyes: Negative.   Respiratory: Negative.   Cardiovascular: Negative.   Gastrointestinal: Negative.   Genitourinary: Negative.   Musculoskeletal: Negative.   Skin: Negative.   Neurological: Negative.   Endo/Heme/Allergies:  Negative.     Blood pressure 113/62, pulse 66, temperature 98 F (36.7 C), resp. rate 16, height  (1.651 m), weight 55.3 kg (122 lb), SpO2 99 %.Body mass index is 20.3 kg/m.  General Appearance: Fairly Groomed  Eye Contact:  Good  Speech:  Normal Rate  Volume:  Normal  Mood:  Euthymic  Affect:  Appropriate  Thought Process:  Linear and Descriptions of Associations: Intact  Orientation:  Full (Time, Place, and Person)  Thought Content:  No hallucinations , no delusions, not internally preoccupied, remains somatically focused   Suicidal Thoughts:  No denies suicidal or self injurious ideations   Homicidal Thoughts:  No denies   Memory:  Recent and remote grossly intact   Judgement:  Fair  Insight:  Fair  Psychomotor Activity:  Normal  Concentration:  Concentration: Good and Attention Span: Good  Recall:  Good  Fund of Knowledge:  Good  Language:  Good  Akathisia:  No  Handed:  Right  AIMS (if indicated):     Assets:  Communication Skills Social Support  ADL's:  Intact  Cognition:  WNL  Sleep:  Number of Hours: 5.75   Assessment - patient is presenting with partial improvement compared to admission. At this time remains anxious, but is less dysphoric, less irritable, and with a more reactive affect. No suicidal ideations at this time and is future oriented, wanting to go to rehab.   Treatment Plan Summary: Daily contact with  patient to assess and evaluate symptoms and progress in treatment, Medication management and Plan is to:  -Encourage group and milieu participation to work on coping skills and symptom reduction -Encourage efforts to work on sobriety and relapse prevention -Increase Cymbalta to 60  mg PO Daily for mood control -Continue  Gabapentin to 300 mg PO TID for anxiety, back pain -Continue Trazodone 50 mg PO QHS PRN for insomnia -Continue Vistaril 25 mgrs Q 6 hours PRN for anxiety as needed  -Treatment team working on disposition planning - patient expressing interest in going to a rehab at discharge  Maryfrances Bunnell, FNP 03/27/2017, 1:18 PM

## 2017-03-27 NOTE — Progress Notes (Signed)
D Patient presents to the nurses'  station immediately after change of shift and requested his morning medications. He endorses a flat, depressed affect. He makes brief, darting eye contact and looks at this writer over top of is eye glass lense. He is wearing clean clothes, he is still as he speaks and he is knowledgeable about his medications and he asks appropriate questions to this Clinical research associatewriter. He shares what his hospital stay has consisted of this week...telling Clinical research associatewriter day-by-day what has happened and then he starts to describe his anxiety attacks and how they present.A HE completed his daily assessment and on this he wrote  He denied Si today and he rated his depression, hopelessness and anxeity " 2/2/5", respectively. He presented to Clinical research associatewriter at 1020 ( after leaving his SW group) and stated he " didn't feel quite right. My head is woozy and I don'rt feel good". When writer attempted to get him to incorporate imagery  As a helathy coping skill, after looking at the clock's second hand for 15 min , patietn replied " this is just making me worse". Pt requested, and was given 600 mg ibuprofen for c/o headache. R Safety is in place.

## 2017-03-28 MED ORDER — HYDROXYZINE HCL 50 MG PO TABS
50.0000 mg | ORAL_TABLET | Freq: Three times a day (TID) | ORAL | Status: DC | PRN
  Administered 2017-03-28 – 2017-03-29 (×3): 50 mg via ORAL
  Filled 2017-03-28: qty 10
  Filled 2017-03-28 (×3): qty 1

## 2017-03-28 NOTE — Progress Notes (Signed)
[facility]  03/28/2017 10:46 AM Marc [name]  MRN:  [id number]   Subjective:  Patient reports feeling really good today. Denies SI/HI/AVH and is future oriented on discharge. [facility] and doesn't want to abuse alcohol or drugs any more. He requests his Vistaril to be increased if possible for anxiety. He denies any SI?HI?AVH and contracts for safety.   Objective: Patient is pleasant and cooperative. He is doing well today and is seen in the day room playing games and interacting with others appropriately. He will get his increased dose of Cymbalta today. I will also increase the Vistaril to 50 mg PO TID PRN. Will discuss with SW staff about [facility] for him tomorrow.  Principal Problem: Bipolar disorder, unspecified (HCC) Diagnosis:   Patient Active Problem List   Diagnosis Date Noted  . Severe episode of recurrent major depressive disorder, without psychotic features (HCC) [F33.2]   . Bipolar disorder, unspecified (HCC) [F31.9] 03/23/2017  . Depression, major, recurrent, in partial remission (HCC) [F33.41] 06/03/2011  . ELBOW SPRAIN, LEFT [[id number]] 09/17/2010   Total Time spent with patient: 25 minutes  Past Psychiatric History: See H&P  Past Medical History:  Past Medical History:  Diagnosis Date  . Anxiety   . Asthma   . Depressed   . Polysubstance abuse   . Wears glasses     Past Surgical History:  Procedure Laterality Date  . COLON SURGERY    . FRACTURE SURGERY     Family History:  Family History  Problem Relation Age of Onset  . Anxiety disorder Father   . Depression Father   . Anxiety disorder Paternal Grandfather   . Anxiety disorder Mother   . Depression Mother   . Depression Cousin   . Drug abuse Cousin    Family Psychiatric  History: See H&P Social History:  History  Alcohol Use  . Yes    Comment: "80oz everyday"      History  Drug Use  . Types: Marijuana    Comment: last used today 03/21/17    Social History    Social History  . Marital status: Single    Spouse name: N/A  . Number of children: N/A  . Years of education: N/A   Social History Main Topics  . Smoking status: Current Every Day Smoker    Packs/day: 2.00    Types: Cigarettes  . Smokeless tobacco: Never Used  . Alcohol use Yes     Comment: "80oz everyday"   . Drug use: Yes    Types: Marijuana     Comment: last used today 03/21/17  . Sexual activity: No   Other Topics Concern  . None   Social History Narrative  . None   Additional Social History:   Sleep: Good  Appetite:  Good  Current Medications: Current Facility-Administered Medications  Medication Dose Route Frequency Provider Last Rate Last Dose  . acetaminophen (TYLENOL) tablet 650 mg  650 mg Oral Q4H PRN Okonkwo, Justina A, NP   650 mg at 03/25/17 1635  . alum & mag hydroxide-simeth (MAALOX/MYLANTA) 200-200-20 MG/5ML suspension 30 mL  30 mL Oral Q4H PRN Okonkwo, Justina A, NP      . DULoxetine (CYMBALTA) DR capsule 60 mg  60 mg Oral Daily Whalen Trompeter, Gerlene Burdockravis B, FNP   60 mg at 03/28/17 0741  . gabapentin (NEURONTIN) capsule 300 mg  300 mg Oral TID Cobos, Rockey SituFernando A, MD   300 mg at 03/28/17 0741  .  hydrOXYzine (ATARAX/VISTARIL) tablet 50 mg  50 mg Oral TID PRN Komal Stangelo, Gerlene Burdock, FNP      . ibuprofen (ADVIL,MOTRIN) tablet 600 mg  600 mg Oral Q6H PRN Nira Conn A, NP   600 mg at 03/28/17 1033  . magnesium hydroxide (MILK OF MAGNESIA) suspension 30 mL  30 mL Oral Daily PRN Okonkwo, Justina A, NP   30 mL at 03/25/17 1858  . methocarbamol (ROBAXIN) tablet 750 mg  750 mg Oral Q6H PRN Nira Conn A, NP   750 mg at 03/27/17 2121  . multivitamin with minerals tablet 1 tablet  1 tablet Oral Daily Cobos, Rockey Situ, MD   1 tablet at 03/28/17 0741  . neomycin-bacitracin-polymyxin (NEOSPORIN) ointment   Topical BID Cobos, Fernando A, MD      . nicotine (NICODERM CQ - dosed in mg/24 hours) patch 21 mg  21 mg Transdermal Daily Cobos, Rockey Situ, MD   21 mg at 03/28/17 0740  . thiamine  (B-1) injection 100 mg  100 mg Intramuscular Once Cobos, Fernando A, MD      . thiamine (VITAMIN B-1) tablet 100 mg  100 mg Oral Daily Cobos, Rockey Situ, MD   100 mg at 03/28/17 0740  . traZODone (DESYREL) tablet 50 mg  50 mg Oral QHS PRN Cobos, Rockey Situ, MD   50 mg at 03/27/17 2120    Lab Results:  No results found for this or any previous visit (from the past 48 hour(s)).  Blood Alcohol level:  Lab Results  Component Value Date   ETH <5 03/21/2017    Metabolic Disorder Labs: Lab Results  Component Value Date   HGBA1C 5.6 04/28/2011   MPG 114 04/28/2011   No results found for: PROLACTIN Lab Results  Component Value Date   CHOL 140 04/28/2011   TRIG 55 04/28/2011   HDL 48 04/28/2011   CHOLHDL 2.9 04/28/2011   VLDL 11 04/28/2011   LDLCALC 81 04/28/2011    Physical Findings: AIMS: Facial and Oral Movements Muscles of Facial Expression: None, normal Lips and Perioral Area: None, normal Jaw: None, normal Tongue: None, normal,Extremity Movements Upper (arms, wrists, hands, fingers): None, normal Lower (legs, knees, ankles, toes): None, normal, Trunk Movements Neck, shoulders, hips: None, normal, Overall Severity Severity of abnormal movements (highest score from questions above): None, normal Incapacitation due to abnormal movements: None, normal Patient's awareness of abnormal movements (rate only patient's report): No Awareness, Dental Status Current problems with teeth and/or dentures?: No Does patient usually wear dentures?: No  CIWA:  CIWA-Ar Total: 1 COWS:     Musculoskeletal: Strength & Muscle Tone: within normal limits Gait & Station: normal Patient leans: N/A  Psychiatric Specialty Exam: Physical Exam  Nursing note and vitals reviewed. Constitutional: He is oriented to person, place, and time. He appears well-developed and well-nourished.  Cardiovascular: Normal rate.   Respiratory: Effort normal.  Musculoskeletal: Normal range of motion.   Neurological: He is alert and oriented to person, place, and time.  Skin: Skin is warm.    Review of Systems  Constitutional: Negative.   HENT: Negative.   Eyes: Negative.   Respiratory: Negative.   Cardiovascular: Negative.   Gastrointestinal: Negative.   Genitourinary: Negative.   Musculoskeletal: Negative.   Skin: Negative.   Neurological: Negative.   Endo/Heme/Allergies: Negative.     Blood pressure 118/69, pulse 72, temperature 98 F (36.7 C), resp. rate 18, height  (1.651 m), weight 55.3 kg (122 lb), SpO2 99 %.Body mass index is 20.3 kg/m.  General Appearance: Fairly Groomed  Eye Contact:  Good  Speech:  Normal Rate  Volume:  Normal  Mood:  Euthymic  Affect:  Appropriate  Thought Process:  Linear and Descriptions of Associations: Intact  Orientation:  Full (Time, Place, and Person)  Thought Content:  WDL   Suicidal Thoughts:  No    Homicidal Thoughts:  No   Memory:  Recent and remote grossly intact   Judgement:  Fair  Insight:  Fair  Psychomotor Activity:  Normal  Concentration:  Concentration: Good and Attention Span: Good  Recall:  Good  Fund of Knowledge:  Good  Language:  Good  Akathisia:  No  Handed:  Right  AIMS (if indicated):     Assets:  Communication Skills Social Support  ADL's:  Intact  Cognition:  WNL  Sleep:  Number of Hours: 5.75   Assessment - patient is presenting with partial improvement compared to admission. At this time remains anxious, but is less dysphoric, less irritable, and with a more reactive affect. No suicidal ideations at this time and is future oriented, wanting to go to rehab.   Treatment Plan Summary: Daily contact with patient to assess and evaluate symptoms and progress in treatment, Medication management and Plan is to:  -Encourage group and milieu participation to work on coping skills and symptom reduction -Encourage efforts to work on sobriety and relapse prevention -Continue Cymbalta to 60  mg PO Daily for mood  control -Continue  Gabapentin to 300 mg PO TID for anxiety, back pain -Continue Trazodone 50 mg PO QHS PRN for insomnia -Increase Vistaril 50 mg TID PRN for anxiety  -Treatment team working on disposition planning - patient expressing interest in going to a rehab at discharge  Maryfrances Bunnell, FNP 03/28/2017, 10:46 AM

## 2017-03-28 NOTE — [facility] Group Notes (Signed)
   Date:  03/28/2017  Time:  1100  Type of Therapy:  Nurse Education  /  Anger as a 2nd Emotion:  The group is focused on teaching patients how to develop their primary feeling that precedes their anger and then how to develop healthy coping skills to deal with their anger.  Participation Level:  Active  Participation Quality:  Attentive  Affect:  Defensive  Cognitive:  Lacking  Insight:  Limited  Engagement in Group:  Improving  Modes of Intervention:  Education  Summary of Progress/Problems:  Marc [name], Marc [name] 03/28/2017, 6:04 PM

## 2017-03-28 NOTE — Progress Notes (Signed)
Patient did attend the evening speaker AA meeting.  

## 2017-03-28 NOTE — [facility] Group Notes (Signed)
[facility] LCSW Group Therapy Note  Date/Time:    03/28/2017   10:00 - 11:00 AM  Type of Therapy and Topic:  Group Therapy:  Healthy Self Image and Positive Change  Participation Level:  Active   Description of Group:  In this group, patients compared and contrasted their current "I am...." statements to the visions they identified as desirable for their lives.  Patients discussed their tendency toward cognitive distortions, and how they can go about making positive changes in their cognitions that will positively impact their behaviors.  Many expressions of similarities and mutual support were provided among group members.  Facilitator played a motivational 3-minute speech and a discussion was held regarding reactions.  Patients were left with the task of thinking about what "I am...." statements they can start using in their lives immediately.  Therapeutic Goals: 1. Patient will state their current self-perception as expressed in an "I Am" statement 2. Patient will contrast this with their desired vision for their lives 3. Patient will discuss cognitive distortions and how these affect their ongoing "I Am" thoughts 4. 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   Therapeutic Modalities Cognitive Behavioral Therapy Motivational Interviewing  Ambrose MantleMareida Grossman-Orr, LCSW 03/28/2017 2:03 PM

## 2017-03-29 MED ORDER — DULOXETINE HCL 60 MG PO CPEP: 60.0000 mg | ORAL_CAPSULE | Freq: Every day | ORAL | 0 refills | Status: DC

## 2017-03-29 MED ORDER — TRAZODONE HCL 50 MG PO TABS: 50.0000 mg | ORAL_TABLET | Freq: Every evening | ORAL | 0 refills | Status: DC | PRN

## 2017-03-29 MED ORDER — GABAPENTIN 300 MG PO CAPS: 300.0000 mg | ORAL_CAPSULE | Freq: Three times a day (TID) | ORAL | 0 refills | Status: DC

## 2017-03-29 MED ORDER — BACITRACIN-NEOMYCIN-POLYMYXIN 400-5-5000 EX OINT: TOPICAL_OINTMENT | Freq: Two times a day (BID) | CUTANEOUS | 0 refills | Status: DC

## 2017-03-29 MED ORDER — HYDROXYZINE HCL 50 MG PO TABS: 50.0000 mg | ORAL_TABLET | Freq: Three times a day (TID) | ORAL | 0 refills | Status: DC | PRN

## 2017-03-29 NOTE — Discharge Summary (Signed)
Physician Discharge Summary Note  Patient:  Marc [name] is an 21 y.o., male MRN:  [id number] DOB:  1995-11-29 Patient phone:  [phone] (home)  Patient address:   [address] [place] [address],  Total Time spent with patient: Greater than 30 minutes  Date of Admission:  03/23/2017 Date of Discharge: 03-29-17  Reason for Admission: Worsening symptoms of Bipolar disorder.    Principal Problem: Bipolar disorder, unspecified [facility])  Discharge Diagnoses: Patient Active Problem List   Diagnosis Date Noted  . Severe episode of recurrent major depressive disorder, without psychotic features (HCC) [F33.2]   . Bipolar disorder, unspecified (HCC) [F31.9] 03/23/2017  . Depression, major, recurrent, in partial remission (HCC) [F33.41] 06/03/2011  . ELBOW SPRAIN, LEFT [[id number]] 09/17/2010   Past Psychiatric History: Major depression, severe, recurrent. Hx. Polysubstance abuse.  Past Medical History:  Past Medical History:  Diagnosis Date  . Anxiety   . Asthma   . Depressed   . Polysubstance abuse   . Wears glasses     Past Surgical History:  Procedure Laterality Date  . COLON SURGERY    . FRACTURE SURGERY     Family History:  Family History  Problem Relation Age of Onset  . Anxiety disorder Father   . Depression Father   . Anxiety disorder Paternal Grandfather   . Anxiety disorder Mother   . Depression Mother   . Depression Cousin   . Drug abuse Cousin    Family Psychiatric  History: See H&P  Social History:  History  Alcohol Use  . Yes    Comment: "80oz everyday"      History  Drug Use  . Types: Marijuana    Comment: last used today 03/21/17    Social History   Social History  . Marital status: Single    Spouse name: N/A  . Number of children: N/A  . Years of education: N/A   Social History Main Topics  . Smoking status: Current Every Day Smoker    Packs/day: 2.00    Types: Cigarettes  . Smokeless tobacco: Never Used  . Alcohol use Yes      Comment: "80oz everyday"   . Drug use: Yes    Types: Marijuana     Comment: last used today 03/21/17  . Sexual activity: No   Other Topics Concern  . None   Social History Narrative  . None   Hospital Course:  Pt admitted to the hospital voluntary from [facility] where he reported thoughts to jump in front of a car. Pt made superficial cuts to bilateral thighs while drinking alcohol.. He has been going to [facility] where he receives celexa. Pt reports that they will not give him anax that he needs for anxiety. He gets xanax off the streets and says that he takes other peoples pain medication. He reports that his stressor is being homeless. He sometimes stays with his grandparents and other times couch surfs. He is not working and has a hx of being arrested for selling marijuana. Other stressors include death of grandfather, father and a friend died of heroin OD last month. He has a poor relationship with his mother and she also has a hx of drug use.   After the above admission assessment, Marc was started om medication regimen for his presenting symptoms. His UDS result upon admission for positive for Benzodiazepines & THC. However, he was not presenting with any substance withdrawal symptoms. As a result, did not receive any detoxification treatments. He was medicated &  discharged on; Duloxetine 60 mg daily for depression, Gabapentin 300 mg for agitation, Hydroxyzine 50 mg prn for anxiety & Trazodone 50 mg for insomnia. Marc was enrolled & participated in the group counseling sessions being offered & held on this unit. He learned coping skills that should help him cope better & maintain mood stability after discharge. Part of his discharge plans included a referral & an appointment to the [facility] for further substance abuse treatment after discharge.  Marc is seen today by the attending psychiatrist for discharge. He has normal anxiety about going into a new setting ([facility]). He is not overwhelmed by  this. He is looking forward to working on his addiction. Not expressing any delusions today. No hallucination. Feels in control of himself. No passivity of thought. No passivity of will. No fantasy about suicide lately. No suicidal thoughts. Looking forward to completing this substance abuse treatment program. No thoughts of violence. No craving for drugs. Does not feel depressed. No evidence of mania.  The nursing staff reports that patient has been appropriate on the unit. Patient has been interacting well with peers. No behavioral issues. Patient has not voiced any suicidal thoughts. Patient has not been observed to be internally stimulated or preoccupied. Patient has been adherent with treatment recommendations. Patient has been tolerating his medications well. No reported adverse effects or reactions reported.   Bryar's case was discussed at the treatment team meeting this morning. The team members feel that patient is back to his baseline level of function. Team agrees with plan to discharge patient today to continue further substance abuse treatment at [facility]. He was provided with a 21 days worth, supply samples of his [facility] discharge medications. He left [facility] with all personal belongings in no apparent distress. Transportation per [employer].  Physical Findings: AIMS: Facial and Oral Movements Muscles of Facial Expression: None, normal Lips and Perioral Area: None, normal Jaw: None, normal Tongue: None, normal,Extremity Movements Upper (arms, wrists, hands, fingers): None, normal Lower (legs, knees, ankles, toes): None, normal, Trunk Movements Neck, shoulders, hips: None, normal, Overall Severity Severity of abnormal movements (highest score from questions above): None, normal Incapacitation due to abnormal movements: None, normal Patient's awareness of abnormal movements (rate only patient's report): No Awareness, Dental Status Current problems with teeth and/or dentures?: No Does patient  usually wear dentures?: No  CIWA:  CIWA-Ar Total: 1 COWS:     Musculoskeletal: Strength & Muscle Tone: within normal limits Gait & Station: normal Patient leans: Right  Psychiatric Specialty Exam: Physical Exam  Constitutional: He appears well-developed.  HENT:  Head: Normocephalic.  Eyes: Pupils are equal, round, and reactive to light.  Neck: Normal range of motion.  Cardiovascular: Normal rate.   Respiratory: Effort normal.  GI: Soft.  Genitourinary:  Genitourinary Comments: Deferred  Musculoskeletal: Normal range of motion.  Neurological: He is alert.  Skin: Skin is warm.    Review of Systems  Constitutional: Negative.   HENT: Negative.   Eyes: Negative.   Respiratory: Negative.   Cardiovascular: Negative.   Gastrointestinal: Negative.   Genitourinary: Negative.   Musculoskeletal: Negative.   Skin: Negative.   Neurological: Negative.   Endo/Heme/Allergies: Negative.   Psychiatric/Behavioral: Positive for depression (Stable) and substance abuse (Hx. Benzodiazipine, opioid & Alcohol use disorder ). Negative for hallucinations, memory loss and suicidal ideas. The patient has insomnia (Stable). The patient is not nervous/anxious.     Blood pressure 105/62, pulse 78, temperature 98.2 F (36.8 C), temperature source Oral, resp. rate 16, height  (  1.651 m), weight 55.3 kg (122 lb), SpO2 99 %.Body mass index is 20.3 kg/m.  See Md's SRA   Have you used any form of tobacco in the last 30 days? (Cigarettes, Smokeless Tobacco, Cigars, and/or Pipes): Yes  Has this patient used any form of tobacco in the last 30 days? (Cigarettes, Smokeless Tobacco, Cigars, and/or Pipes): No  Blood Alcohol level:  Lab Results  Component Value Date   ETH <5 03/21/2017   Metabolic Disorder Labs:  Lab Results  Component Value Date   HGBA1C 5.6 04/28/2011   MPG 114 04/28/2011   No results found for: PROLACTIN Lab Results  Component Value Date   CHOL 140 04/28/2011   TRIG 55  04/28/2011   HDL 48 04/28/2011   CHOLHDL 2.9 04/28/2011   VLDL 11 04/28/2011   LDLCALC 81 04/28/2011   See Psychiatric Specialty Exam and Suicide Risk Assessment completed by Attending Physician prior to discharge.  Discharge destination:  Home  Is patient on multiple antipsychotic therapies at discharge:  No   Has Patient had three or more failed trials of antipsychotic monotherapy by history:  No  Recommended Plan for Multiple Antipsychotic Therapies: NA  Allergies as of 03/29/2017      Reactions   Promethazine Hcl Other (See Comments)   Seizures      Medication List    STOP taking these medications   albuterol 108 (90 Base) MCG/ACT inhaler Commonly known as:  PROVENTIL HFA;VENTOLIN HFA   ALPRAZolam 0.25 MG tablet Commonly known as:  XANAX   citalopram 20 MG tablet Commonly known as:  CELEXA     TAKE these medications     Indication  DULoxetine 60 MG capsule Commonly known as:  CYMBALTA Take 1 capsule (60 mg total) by mouth daily. For depression  Indication:  Major Depressive Disorder   gabapentin 300 MG capsule Commonly known as:  NEURONTIN Take 1 capsule (300 mg total) by mouth 3 (three) times daily. For agitation  Indication:  Agitation   hydrOXYzine 50 MG tablet Commonly known as:  ATARAX/VISTARIL Take 1 tablet (50 mg total) by mouth 3 (three) times daily as needed for anxiety.  Indication:  Feeling Anxious   neomycin-bacitracin-polymyxin ointment Commonly known as:  NEOSPORIN Apply topically 2 (two) times daily. apply to eye  Indication:  Eye irritation   traZODone 50 MG tablet Commonly known as:  DESYREL Take 1 tablet (50 mg total) by mouth at bedtime as needed for sleep. What changed:  medication strength  how much to take  when to take this  reasons to take this  Indication:  Trouble Sleeping      Follow-up Information    [facility] Follow up.   Why:  Please walk in for follow-up within 7 days of rehab/hospital  discharge. Walk in hours 8-9am Monday through Friday.  Contact information: [address] [place] [address] [phone]        [facility] Follow up.   Specialty:  Addiction Medicine Why:  You have been accepted for admission to [facility] today at 11:00AM. Driver from facility will pick you up for transport. Please make sure that you are provided with 21 day supply of medications. Contact information: [address] [place] [address] [phone]          Follow-up recommendations: Activity:  As tolerated Diet: As recommended by your primary care doctor. Keep all scheduled follow-up appointments as recommended.  Comments: Patient is instructed prior to discharge to: Take all  medications as prescribed by his/her mental healthcare provider. Report any adverse effects and or reactions from the medicines to his/her outpatient provider promptly. Patient has been instructed & cautioned: To not engage in alcohol and or illegal drug use while on prescription medicines. In the event of worsening symptoms, patient is instructed to call the crisis hotline, 911 and or go to the nearest ED for appropriate evaluation and treatment of symptoms. To follow-up with his/her primary care provider for your other medical issues, concerns and or health care needs.   Signed: Sanjuana KavaNwoko, Agnes I, NP, PMHNP, FNP-BC 03/29/2017, 2:36 PM  Patient seen, Suicide Assessment Completed.  Disposition Plan Reviewed

## 2017-03-29 NOTE — Progress Notes (Signed)
D: Patient observed up and restless on the unit. Intrusive at times with frequent needs.  Patient verbalizes to this Clinical research associatewriter he is anxious and requests vistaril prn. Patient's affect congruent with report as is mood. Per self inventory and discussions with writer, rates depression at a 0/10, hopelessness at a 0/10 and anxiety at a 7/10. Rates sleep as good, appetite as good, energy as normal and concentration as good.  States goal for today is to "be more positive, not stress, take it day by day." Denies pain, physical problems.   A: Medicated per orders, prn vistaril given for anxiety. Level III obs in place for safety. Emotional support offered and self inventory reviewed. Encouraged completion of Suicide Safety Plan and programming participation. Discussed POC with MD, SW.  Fall prevention plan in place and reviewed with patient as pt is a high fall risk due to his report of hx of seizure disorder.   R: Patient verbalizes understanding of POC, falls prevention education. On reassess, patient calmer. Patient denies SI/HI/AVH and remains safe on level III obs. Will continue to monitor closely and make verbal contact frequently.

## 2017-03-29 NOTE — [facility] Suicide Risk Assessment (Addendum)
[facility]   Principal Problem: Bipolar disorder, unspecified [facility](HCC) Discharge Diagnoses:  Patient Active Problem List   Diagnosis Date Noted  . Severe episode of recurrent major depressive disorder, without psychotic features (HCC) [F33.2]   . Bipolar disorder, unspecified (HCC) [F31.9] 03/23/2017  . Depression, major, recurrent, in partial remission (HCC) [F33.41] 06/03/2011  . ELBOW SPRAIN, LEFT [[id number]] 09/17/2010    Total Time spent with patient: 30 minutes  Musculoskeletal: Strength & Muscle Tone: within normal limits Gait & Station: normal Patient leans: N/A  Psychiatric Specialty Exam: ROS no headache, no chest pain , no shortness of breath, mild lower back pain  Blood pressure 105/62, pulse 78, temperature 98.2 F (36.8 C), temperature source Oral, resp. rate 16, height 5\' 5"  (1.651 m), weight 55.3 kg (122 lb), SpO2 99 %.Body mass index is 20.3 kg/m.  General Appearance: improved grooming  Eye Contact::  Good  Speech:  Normal Rate409  Volume:  Normal  Mood:  improving, states he feels better than on admission  Affect:  more reactive, less anxious   Thought Process:  Linear and Descriptions of Associations: Intact  Orientation:  Full (Time, Place, and Person)  Thought Content:  denies hallucinations, no delusions, not internally preoccupied   Suicidal Thoughts:  No denies any suicidal ideations, no homicidal ideations   Homicidal Thoughts:  No  Memory:  recent and remote grossly intact   Judgement:  Other:  improving   Insight:  improving   Psychomotor Activity:  Normal  Concentration:  Good  Recall:  Good  Fund of Knowledge:Good  Language: Good  Akathisia:  Negative  Handed:  Right  AIMS (if indicated):     Assets:  Communication Skills Desire for Improvement Resilience  Sleep:  Number of Hours: 6.5  Cognition: WNL  ADL's:  Intact   Mental Status Per Nursing Assessment::   On Admission:  Suicidal ideation indicated by patient,  Self-harm thoughts, Self-harm behaviors  Demographic Factors:  21 year old single male , no children, was living with great grandmother before admission  Loss Factors: Recent death of loved ones  Historical Factors: History of anxiety, history of substance abuse , has not attempted suicide in the past   Risk Reduction Factors:   Positive coping skills or problem solving skills  Continued Clinical Symptoms:  At this time patient alert and attentive, improved compared to admission, mood improved, affect improved, more reactive, still anxious, no thought disorder, no suicidal or self injurious ideations, no homicidal or violent ideations, no psychotic symptoms, future oriented, [facility]. No medication side effects, states he feels medications are helping .    Cognitive Features That Contribute To Risk:  No gross cognitive deficits noted upon discharge. Is alert , attentive, and oriented x 3   Suicide Risk:  Mild:  Suicidal ideation of limited frequency, intensity, duration, and specificity.  There are no identifiable plans, no associated intent, mild dysphoria and related symptoms, good self-control (both objective and subjective assessment), few other risk factors, and identifiable protective factors, including available and accessible social support.  Follow-up Information    [facility] Follow up on 04/01/2017.   Why:  Hospital follow-up on this date at 9:00AM. Thank you.  Contact information: [address] [place] [address] [phone]           Plan Of Care/Follow-up recommendations:  Activity:  as tolerated  Diet:  Regular Tests:  NA Other:  See below  [facility], leaving in good spirits.  Craige Cotta, MD 03/29/2017, 9:58 AM

## 2017-03-29 NOTE — Progress Notes (Signed)
D    Pt concentration is still poor but he reports improved mood and general outlook on life   He said his day was mostly good but it had its not so good moments    He interacts appropriately with peers and is compliant with his treatment A    Verbal support given    Medications administered and effectiveness monitored    Q 15 min checks R   Pt remains safe and receptive to verbal support

## 2017-03-29 NOTE — Progress Notes (Signed)
Patient verbalizes readiness for discharge. Follow up plan explained, AVS, transition record and SRA printed along with prescriptions. Sample meds filled. All belongings returned. Patient verbalizes understanding. Denies SI/HI and assures this Clinical research associatewriter he will seek assistance should that change. Patient discharged ambulatory and in stable condition to [facility]. All materials, meds given directly to rep.

## 2017-03-29 NOTE — Progress Notes (Signed)
  [facility] :  Will you be returning to the same living situation after discharge:  No.Pt has [facility].  At discharge, do you have transportation home?: Yes,  [facility] driver will pick up for safet transport. at 11am.  Do you have the ability to pay for your medications: Yes,  mental health  Release of information consent forms completed and submitted to medical records by CSW.  Patient to Follow up at: Follow-up Information    [facility] Follow up.   Why:  Please walk in for follow-up within 7 days of rehab/hospital discharge. Walk in hours 8-9am Monday through Friday.  Contact information: [address] [place] [address] [phone]        [facility] Follow up.   Specialty:  Addiction Medicine Why:  You have been [facility]. Driver from facility will pick you up for transport. Please make sure that you are provided with 21 day supply of medications. Contact information: [address] [place] [address] [phone]           Next level of care provider has [facility] to [facility] Link:no  Safety Planning and Suicide Prevention discussed: Yes,  SPE completed with pt; pt declined to consent to family contact. SPI pamphlet and Mobile Crisis information provided.  Have you used any form of tobacco in the last 30 days? (Cigarettes, Smokeless Tobacco, Cigars, and/or Pipes): Yes  Has patient been referred to the Quitline?: Patient refused referral  Patient has been referred for addiction treatment: Yes  Pulte HomesHeather N Smart, LCSW 03/29/2017, 10:07 AM

## 2017-03-29 NOTE — Progress Notes (Signed)
Recreation Therapy Notes  Date: 03/29/17 Time: 0930 Location: [address] Dayroom  Group Topic: Stress Management  Goal Area(s) Addresses:  Patient will verbalize importance of using healthy stress management.  Patient will identify positive emotions associated with healthy stress management.   Intervention: Stress Management  Activity :  Meditation.   LRT introduced the stress management technique of meditation.  LRT played a meditation from the Calm app that focused on scanning the body.  Patients were to focus on and make not of whatever sensations they were feeling.  Education:  Stress Management, Discharge Planning.   Education Outcome: Acknowledges edcuation/In group clarification offered/Needs additional education  Clinical Observations/Feedback: Pt did not attend group.   Caroll RancherMarjette Adjoa Althouse, LRT/CTRS         Caroll RancherLindsay, Jonell Brumbaugh A 03/29/2017 12:10 PM

## 2017-06-16 ENCOUNTER — Encounter (HOSPITAL_COMMUNITY): Payer: Self-pay | Admitting: *Deleted

## 2017-06-16 ENCOUNTER — Other Ambulatory Visit: Payer: Self-pay

## 2017-06-16 ENCOUNTER — Emergency Department (HOSPITAL_COMMUNITY)
Admission: EM | Admit: 2017-06-16 | Discharge: 2017-06-17 | Disposition: A | Discharge status: Home / Self Care | Payer: Self-pay | Attending: Emergency Medicine | Admitting: Emergency Medicine

## 2017-06-16 DIAGNOSIS — Z79899 Other long term (current) drug therapy: Secondary | ICD-10-CM | POA: Insufficient documentation

## 2017-06-16 DIAGNOSIS — J45909 Unspecified asthma, uncomplicated: Secondary | ICD-10-CM | POA: Insufficient documentation

## 2017-06-16 DIAGNOSIS — R300 Dysuria: Secondary | ICD-10-CM | POA: Insufficient documentation

## 2017-06-16 DIAGNOSIS — F17210 Nicotine dependence, cigarettes, uncomplicated: Secondary | ICD-10-CM | POA: Insufficient documentation

## 2017-06-16 LAB — BASIC METABOLIC PANEL
Anion gap: 6 (ref 5–15)
BUN: 21 mg/dL — AB (ref 6–20)
CALCIUM: 9.5 mg/dL (ref 8.9–10.3)
CO2: 28 mmol/L (ref 22–32)
CREATININE: 0.9 mg/dL (ref 0.61–1.24)
Chloride: 101 mmol/L (ref 101–111)
GFR calc non Af Amer: >60 mL/min (ref >60)
Glucose, Bld: 100 mg/dL — ABNORMAL HIGH (ref 65–99)
Potassium: 4 mmol/L (ref 3.5–5.1)
SODIUM: 135 mmol/L (ref 135–145)

## 2017-06-16 LAB — URINALYSIS, ROUTINE W REFLEX MICROSCOPIC
Bilirubin Urine: NEGATIVE
Glucose, UA: NEGATIVE mg/dL
Hgb urine dipstick: NEGATIVE
Ketones, ur: NEGATIVE mg/dL
Leukocytes, UA: NEGATIVE
Nitrite: NEGATIVE
Protein, ur: NEGATIVE mg/dL
SPECIFIC GRAVITY, URINE: 1 — AB (ref 1.005–1.030)
pH: 5 (ref 5.0–8.0)

## 2017-06-16 LAB — CBG MONITORING, ED: GLUCOSE-CAPILLARY: 85 mg/dL (ref 65–99)

## 2017-06-16 MED ORDER — STERILE WATER FOR INJECTION IJ SOLN
INTRAMUSCULAR | Status: AC
  Filled 2017-06-16: qty 10

## 2017-06-16 MED ORDER — AZITHROMYCIN 250 MG PO TABS
1000.0000 mg | ORAL_TABLET | Freq: Once | ORAL | Status: AC
  Administered 2017-06-16: 1000 mg via ORAL
  Filled 2017-06-16: qty 4

## 2017-06-16 MED ORDER — CEFTRIAXONE SODIUM 250 MG IJ SOLR
250.0000 mg | Freq: Once | INTRAMUSCULAR | Status: AC
  Administered 2017-06-16: 250 mg via INTRAMUSCULAR
  Filled 2017-06-16: qty 250

## 2017-06-16 NOTE — ED Notes (Signed)
Patient states that he had an STD test done and now he is passing blood. Wants to speak with RN

## 2017-06-16 NOTE — ED Triage Notes (Signed)
Pt c/o pain with urination, problems with emptying bladder that started two days ago,

## 2017-06-17 NOTE — ED Provider Notes (Signed)
[facility] EMERGENCY DEPARTMENT Provider Note   CSN: [id number] Arrival date & time: 06/16/17  2033     History   Chief Complaint Chief Complaint  Patient presents with  . Dysuria    HPI Marc [name] is a 21 y.o. male.  HPI   Patient is a 21 year old male with a history of anxiety, depression, polysubstance use presenting for dysuria. Patient reports that over the past couple days he has had increasing urge to use the bathroom as well as pain at the head of the penis with urination. Patient denies any penile discharge. Additionally, patient reports that he has had increasing pain in his back. Patient does report that this proceeded the dysuria and he routinely has diffuse spinal and paraspinal discomfort. Patient reports that he has had a history of STI, however he has had these symptoms before when he has not had an STI. Patient denies any fever or chills, lower abdominal tenderness, testicular pain, or rectal pain with bowel movement. Patient reports that he was sexually active with a male partner 2 weeks ago who is known to have STI, and who is active with other partners.  Past Medical History:  Diagnosis Date  . Anxiety   . Asthma   . Depressed   . Polysubstance abuse (HCC)   . Wears glasses     Patient Active Problem List   Diagnosis Date Noted  . Severe episode of recurrent major depressive disorder, without psychotic features (HCC)   . Bipolar disorder, unspecified (HCC) 03/23/2017  . Depression, major, recurrent, in partial remission (HCC) 06/03/2011  . ELBOW SPRAIN, LEFT 09/17/2010    Past Surgical History:  Procedure Laterality Date  . COLON SURGERY    . FRACTURE SURGERY         Home Medications    Prior to Admission medications   Medication Sig Start Date End Date Taking? Authorizing Provider  DULoxetine (CYMBALTA) 60 MG capsule Take 1 capsule (60 mg total) by mouth daily. For depression Patient not taking: Reported on 06/16/2017 03/30/17    Armandina StammerNwoko, Agnes I, NP  gabapentin (NEURONTIN) 300 MG capsule Take 1 capsule (300 mg total) by mouth 3 (three) times daily. For agitation Patient not taking: Reported on 06/16/2017 03/29/17   Armandina StammerNwoko, Agnes I, NP  hydrOXYzine (ATARAX/VISTARIL) 50 MG tablet Take 1 tablet (50 mg total) by mouth 3 (three) times daily as needed for anxiety. Patient not taking: Reported on 06/16/2017 03/29/17   Armandina StammerNwoko, Agnes I, NP  neomycin-bacitracin-polymyxin (NEOSPORIN) ointment Apply topically 2 (two) times daily. apply to eye Patient not taking: Reported on 06/16/2017 03/29/17   Armandina StammerNwoko, Agnes I, NP  traZODone (DESYREL) 50 MG tablet Take 1 tablet (50 mg total) by mouth at bedtime as needed for sleep. Patient not taking: Reported on 06/16/2017 03/29/17   Sanjuana KavaNwoko, Agnes I, NP    Family History Family History  Problem Relation Age of Onset  . Anxiety disorder Father   . Depression Father   . Anxiety disorder Paternal Grandfather   . Anxiety disorder Mother   . Depression Mother   . Depression Cousin   . Drug abuse Cousin     Social History Social History   Tobacco Use  . Smoking status: Current Every Day Smoker    Packs/day: 2.00    Types: Cigarettes  . Smokeless tobacco: Never Used  Substance Use Topics  . Alcohol use: Yes    Comment: "80oz everyday"   . Drug use: Yes    Types: Marijuana  Comment: last used today 03/21/17     Allergies   Promethazine hcl   Review of Systems Review of Systems  Constitutional: Negative for chills and fever.  HENT: Negative for congestion.   Respiratory: Negative for cough and shortness of breath.   Cardiovascular: Negative for chest pain.  Gastrointestinal: Negative for abdominal pain, nausea and vomiting.  Genitourinary: Positive for dysuria, frequency, penile pain and urgency. Negative for difficulty urinating, genital sores and testicular pain.  Musculoskeletal: Positive for back pain.  Skin: Negative for rash.  All other systems reviewed and are  negative.    Physical Exam Updated Vital Signs BP 114/84 (BP Location: Left Arm)   Pulse 62   Temp 98.2 F (36.8 C) (Oral)   Resp 18   Ht 5\' 5"  (1.651 m)   Wt 55.8 kg (123 lb)   SpO2 100%   BMI 20.47 kg/m   Physical Exam  Constitutional: He appears well-developed and well-nourished. No distress.  HENT:  Head: Normocephalic and atraumatic.  Mouth/Throat: Oropharynx is clear and moist.  Eyes: Conjunctivae and EOM are normal. Pupils are equal, round, and reactive to light.  Neck: Normal range of motion. Neck supple.  Cardiovascular: Normal rate, regular rhythm, S1 normal and S2 normal.  No murmur heard. Pulmonary/Chest: Effort normal and breath sounds normal. He has no wheezes. He has no rales.  Abdominal: Soft. He exhibits no distension. There is no tenderness. There is no guarding.  Genitourinary:  Genitourinary Comments: Exam performed with nurse chaperone present. Circumcised male. No inguinal lymphadenopathy or rashes of the inguinal region. No tenderness to palpation of testicles or epididymis bilaterally. No penile shaft tenderness. No active drainage.  Musculoskeletal: Normal range of motion. He exhibits no edema or deformity.  There is diffuse paraspinal muscular tenderness in cervical, thoracic, lumbar region.  Lymphadenopathy:    He has no cervical adenopathy.  Neurological: He is alert.  Cranial nerves grossly intact. Patient moves extremities symmetrically with good coordination.  Skin: Skin is warm and dry. No rash noted. No erythema.  Psychiatric: He has a normal mood and affect. His behavior is normal. Judgment and thought content normal.  Nursing note and vitals reviewed.    ED Treatments / Results  Labs (all labs ordered are listed, but only abnormal results are displayed) Labs Reviewed  URINALYSIS, ROUTINE W REFLEX MICROSCOPIC - Abnormal; Notable for the following components:      Result Value   APPearance CLOUDY (*)    Specific Gravity, Urine 1.000  (*)    All other components within normal limits  BASIC METABOLIC PANEL - Abnormal; Notable for the following components:   Glucose, Bld 100 (*)    BUN 21 (*)    All other components within normal limits  RPR  HIV ANTIBODY (ROUTINE TESTING)  CBG MONITORING, ED  GC/CHLAMYDIA PROBE AMP ([place]) NOT AT [facility]    EKG  EKG Interpretation None       Radiology No results found.  Procedures Procedures (including critical care time)  Medications Ordered in ED Medications  sterile water (preservative free) injection (not administered)  cefTRIAXone (ROCEPHIN) injection 250 mg (250 mg Intramuscular Given 06/16/17 2335)  azithromycin (ZITHROMAX) tablet 1,000 mg (1,000 mg Oral Given 06/16/17 2335)     Initial Impression / Assessment and Plan / ED Course  I have reviewed the triage vital signs and the nursing notes.  Pertinent labs & imaging results that were available during my care of the patient were reviewed by me and considered in my  medical decision making (see chart for details).     Final Clinical Impressions(s) / ED Diagnoses   Final diagnoses:  Dysuria   Patient is nontoxic-appearing, afebrile, and in no acute distress. Patient's symptoms of dysuria are most likely secondary to sexual transmitted infection. Patient reports he has had these symptoms for currently in addition to urgency and frequency that occur when he is not having the dysuria. I assessed blood sugar today and is nonelevated. Patient has a recent known high-risk sexual encounter with a male partner known to be infected with some STIs. Patient elected for treatment today empirically. I also provided referral to urology for follow-up if the symptoms are recurring after treatment. I also recommended patient obtain insurance and PCP follow-up through open enrollment. Return precautions given for any lower abdominal pain, worsening flank pain, fevers, chills, testicular pain, rectal pain. Patient is in  understanding and agrees the plan of care.  ED Discharge Orders    None       Delia ChimesMurray, Leomia Blake B, PA-C 06/17/17 [id number]    Vanetta MuldersZackowski, Scott, MD 06/18/17 [phone]

## 2017-06-17 NOTE — Discharge Instructions (Signed)
Medications: You have been treated today for gonorrhea and chlamydia. Use a condom with every sexual encounter Follow up with the urologist listed regarding your symptoms. Dr.[place]enzie [place] in [place]. Please call his office if you continue to have symptoms after treatment for STI. Please return to the ER for worsening symptoms, high fevers or persistent vomiting.  You have been tested for HIV, syphilis, chlamydia and gonorrhea. These results will be available in approximately 3 days. You will be notified if they are positive.   It is very important to practice safe sex and use condoms when sexually active. If your results are positive you need to notify all sexual partners so they can be treated as well. The website https://garcia.[email or web address]://www.dontspreadit.com/ can be used to send anonymous text messages or emails to alert sexual contacts.   SEEK IMMEDIATE MEDICAL CARE IF:  You develop an oral temperature above 102 F (38.9 C), not controlled by medications or lasting more than 2 days.  You develop an increase in pain.  You develop rectal pain or testicular pain. You develop painful intercourse.   Please go to healthcare.gov for open enrollment.

## 2017-06-18 LAB — RPR: RPR: NONREACTIVE

## 2017-06-18 LAB — HIV ANTIBODY (ROUTINE TESTING W REFLEX): HIV SCREEN 4TH GENERATION: NONREACTIVE

## 2017-06-19 ENCOUNTER — Emergency Department (HOSPITAL_COMMUNITY)
Admission: EM | Admit: 2017-06-19 | Discharge: 2017-06-22 | Disposition: A | Discharge status: Other Acute Inpatient Hospital | Payer: Self-pay | Attending: Emergency Medicine | Admitting: Emergency Medicine

## 2017-06-19 ENCOUNTER — Encounter (HOSPITAL_COMMUNITY): Payer: Self-pay | Admitting: *Deleted

## 2017-06-19 DIAGNOSIS — F332 Major depressive disorder, recurrent severe without psychotic features: Secondary | ICD-10-CM | POA: Diagnosis present

## 2017-06-19 DIAGNOSIS — R45851 Suicidal ideations: Secondary | ICD-10-CM | POA: Insufficient documentation

## 2017-06-19 DIAGNOSIS — F329 Major depressive disorder, single episode, unspecified: Secondary | ICD-10-CM | POA: Insufficient documentation

## 2017-06-19 DIAGNOSIS — F17210 Nicotine dependence, cigarettes, uncomplicated: Secondary | ICD-10-CM | POA: Insufficient documentation

## 2017-06-19 DIAGNOSIS — J45909 Unspecified asthma, uncomplicated: Secondary | ICD-10-CM | POA: Insufficient documentation

## 2017-06-19 HISTORY — DX: Unspecified convulsions: R56.9

## 2017-06-19 LAB — CBC
HCT: 43.4 % (ref 39.0–52.0)
Hemoglobin: 14.4 g/dL (ref 13.0–17.0)
MCH: 31.3 pg (ref 26.0–34.0)
MCHC: 33.2 g/dL (ref 30.0–36.0)
MCV: 94.3 fL (ref 78.0–100.0)
PLATELETS: 242 10*3/uL (ref 150–400)
RBC: 4.6 MIL/uL (ref 4.22–5.81)
RDW: 12.8 % (ref 11.5–15.5)
WBC: 8.1 10*3/uL (ref 4.0–10.5)

## 2017-06-19 LAB — SALICYLATE LEVEL

## 2017-06-19 LAB — RAPID URINE DRUG SCREEN, HOSP PERFORMED
Amphetamines: NOT DETECTED
BARBITURATES: NOT DETECTED
Benzodiazepines: NOT DETECTED
Cocaine: NOT DETECTED
OPIATES: NOT DETECTED
TETRAHYDROCANNABINOL: POSITIVE — AB

## 2017-06-19 LAB — BASIC METABOLIC PANEL
Anion gap: 5 (ref 5–15)
BUN: 20 mg/dL (ref 6–20)
CALCIUM: 9.8 mg/dL (ref 8.9–10.3)
CO2: 30 mmol/L (ref 22–32)
CREATININE: 0.89 mg/dL (ref 0.61–1.24)
Chloride: 106 mmol/L (ref 101–111)
Glucose, Bld: 112 mg/dL — ABNORMAL HIGH (ref 65–99)
Potassium: 4.4 mmol/L (ref 3.5–5.1)
SODIUM: 141 mmol/L (ref 135–145)

## 2017-06-19 LAB — ACETAMINOPHEN LEVEL

## 2017-06-19 LAB — ETHANOL

## 2017-06-19 MED ORDER — NICOTINE 21 MG/24HR TD PT24
21.0000 mg | MEDICATED_PATCH | Freq: Every day | TRANSDERMAL | Status: DC
  Administered 2017-06-19 – 2017-06-21 (×3): 21 mg via TRANSDERMAL
  Filled 2017-06-19 (×3): qty 1

## 2017-06-19 MED ORDER — HYDROXYZINE HCL 25 MG PO TABS
50.0000 mg | ORAL_TABLET | Freq: Three times a day (TID) | ORAL | Status: DC | PRN
  Administered 2017-06-20 – 2017-06-21 (×4): 50 mg via ORAL
  Filled 2017-06-19 (×5): qty 2

## 2017-06-19 MED ORDER — ALUM & MAG HYDROXIDE-SIMETH 200-200-20 MG/5ML PO SUSP: 30.0000 mL | Freq: Four times a day (QID) | ORAL | Status: DC | PRN

## 2017-06-19 MED ORDER — ALPRAZOLAM 0.5 MG PO TABS
0.5000 mg | ORAL_TABLET | Freq: Once | ORAL | Status: AC
  Administered 2017-06-19: 0.5 mg via ORAL
  Filled 2017-06-19: qty 1

## 2017-06-19 MED ORDER — ONDANSETRON HCL 4 MG PO TABS
4.0000 mg | ORAL_TABLET | Freq: Three times a day (TID) | ORAL | Status: DC | PRN
  Administered 2017-06-19: 4 mg via ORAL
  Filled 2017-06-19: qty 1

## 2017-06-19 MED ORDER — IBUPROFEN 400 MG PO TABS
600.0000 mg | ORAL_TABLET | Freq: Three times a day (TID) | ORAL | Status: DC | PRN
  Administered 2017-06-20 (×2): 600 mg via ORAL
  Filled 2017-06-19 (×2): qty 2

## 2017-06-19 MED ORDER — DULOXETINE HCL 30 MG PO CPEP
60.0000 mg | ORAL_CAPSULE | Freq: Every day | ORAL | Status: DC
  Administered 2017-06-20 – 2017-06-21 (×2): 60 mg via ORAL
  Filled 2017-06-19 (×2): qty 2

## 2017-06-19 MED ORDER — TRAZODONE HCL 50 MG PO TABS
50.0000 mg | ORAL_TABLET | Freq: Every evening | ORAL | Status: DC | PRN
  Administered 2017-06-20: 50 mg via ORAL
  Filled 2017-06-19: qty 1

## 2017-06-19 NOTE — ED Provider Notes (Signed)
Emergency Department Provider Note   I have reviewed the triage vital signs and the nursing notes.   HISTORY  Chief Complaint V70.1   HPI Marc [name] is a 21 y.o. male with PMH of anxiety and bipolar disorder presents to the ED for evaluation of racing thoughts and increasingly intrusive suicidal thinking.  Patient was admitted to [facility] for psychiatric evaluation and discharged on 03/29/2017.  Patient states that after discharge he stopped taking his psychiatry medications because "I feel better off my meds. I know it will come back but I feel better."  He states he has had worsening anxiety symptoms to the point where he feels like he is "shutting down."  Over the last several days he has had increasing suicidal thinking.  He describes it as mostly fleeting but sometimes has a plan, and other times does not. He is concerned because he is impulsive at times and is worried that he is unsafe. He was feeling unsafe with himself at home today and so self presented to the emergency department.  He denies any alcohol or illicit drug abuse.  Denies any auditory or visual hallucinations.  No homicidal ideation.    Past Medical History:  Diagnosis Date  . Anxiety   . Asthma   . Depressed   . Polysubstance abuse (HCC)   . Seizures (HCC)   . Wears glasses     Patient Active Problem List   Diagnosis Date Noted  . Severe episode of recurrent major depressive disorder, without psychotic features (HCC)   . Bipolar disorder, unspecified (HCC) 03/23/2017  . Depression, major, recurrent, in partial remission (HCC) 06/03/2011  . ELBOW SPRAIN, LEFT 09/17/2010    Past Surgical History:  Procedure Laterality Date  . COLON SURGERY    . FRACTURE SURGERY      Current Outpatient Rx  . Order #: [id number] Class: Normal  . Order #: [id number] Class: Normal  . Order #: [id number] Class: Normal  . Order #: [id number] Class: No Print  . Order #: [id number] Class: Normal     Allergies Promethazine hcl  Family History  Problem Relation Age of Onset  . Anxiety disorder Father   . Depression Father   . Anxiety disorder Paternal Grandfather   . Anxiety disorder Mother   . Depression Mother   . Depression Cousin   . Drug abuse Cousin     Social History Social History   Tobacco Use  . Smoking status: Current Every Day Smoker    Packs/day: 2.00    Types: Cigarettes  . Smokeless tobacco: Never Used  Substance Use Topics  . Alcohol use: Yes    Comment: very rarely per pt  . Drug use: Yes    Types: Marijuana    Comment: occ use 06/19/17    Review of Systems  Constitutional: No fever/chills Eyes: No visual changes. ENT: No sore throat. Cardiovascular: Denies chest pain. Respiratory: Denies shortness of breath. Gastrointestinal: No abdominal pain.  No nausea, no vomiting.  No diarrhea.  No constipation. Genitourinary: Negative for dysuria. Musculoskeletal: Positive for chronic back pain. Skin: Negative for rash. Neurological: Negative for headaches, focal weakness or numbness. Psychiatric:Positive anxiety, depression, and suicidal thinking.   10-point ROS otherwise negative.  ____________________________________________   PHYSICAL EXAM:  VITAL SIGNS: ED Triage Vitals  Enc Vitals Group     BP 06/19/17 2123 134/72     Pulse Rate 06/19/17 2123 83     Resp 06/19/17 2123 16     Temp 06/19/17 2123 98.3 F (36.8 C)  Temp Source 06/19/17 2123 Temporal     SpO2 06/19/17 2123 99 %     Weight 06/19/17 2124 123 lb (55.8 kg)     Height 06/19/17 2124 5\' 5"  (1.651 m)     Pain Score 06/19/17 2123 0    Constitutional: Alert and oriented. Somewhat anxious appearing.  Eyes: Conjunctivae are normal.  Head: Atraumatic. Nose: No congestion/rhinnorhea. Mouth/Throat: Mucous membranes are moist.  Neck: No stridor. Cardiovascular: Normal rate, regular rhythm. Good peripheral circulation. Grossly normal heart sounds.   Respiratory: Normal  respiratory effort.  No retractions. Lungs CTAB. Gastrointestinal: Soft and nontender. No distention.  Musculoskeletal: No lower extremity tenderness nor edema. No gross deformities of extremities. Neurologic:  Normal speech and language. No gross focal neurologic deficits are appreciated.  Skin:  Skin is warm, dry and intact. No rash noted. Psychiatric: Mood and affect are flat. Speech and behavior are normal. Not responding to internal stimuli.   ____________________________________________   LABS (all labs ordered are listed, but only abnormal results are displayed)  Labs Reviewed  ETHANOL  CBC  BASIC METABOLIC PANEL  RAPID URINE DRUG SCREEN, HOSP PERFORMED  ACETAMINOPHEN LEVEL  SALICYLATE LEVEL   ____________________________________________  RADIOLOGY  None ____________________________________________   PROCEDURES  Procedure(s) performed:   Procedures  None ____________________________________________   INITIAL IMPRESSION / ASSESSMENT AND PLAN / ED COURSE  Pertinent labs & imaging results that were available during my care of the patient were reviewed by me and considered in my medical decision making (see chart for details).  Patient presents to the emergency department voluntarily worsening anxiety with underlying depression and worsening suicidal thinking.  He describes his thoughts as impulsive but worsening.  He has been off of his medication over the last 2-3 months since his last psychiatry admission.  He is cooperative at this time. He does seem slightly anxious on my exam. Patient with chronic back pain but no other acute medical complaints. Patient not under IVC at this time. He appears sober and cooperative so will not place under IVC now. TTS consulted.   Re-started home medication. No significant risk for EtOH withdrawal. Gave since dose Xanax for immediate symptoms and re-started Atarax for anxiety as well as other meds.   ____________________________________________  FINAL CLINICAL IMPRESSION(S) / ED DIAGNOSES  Final diagnoses:  Depression with suicidal ideation     MEDICATIONS GIVEN DURING THIS VISIT:  Medications  ALPRAZolam (XANAX) tablet 0.5 mg (not administered)  hydrOXYzine (ATARAX/VISTARIL) tablet 50 mg (not administered)  ibuprofen (ADVIL,MOTRIN) tablet 600 mg (not administered)  ondansetron (ZOFRAN) tablet 4 mg (not administered)  alum & mag hydroxide-simeth (MAALOX/MYLANTA) 200-200-20 MG/5ML suspension 30 mL (not administered)  nicotine (NICODERM CQ - dosed in mg/24 hours) patch 21 mg (not administered)  DULoxetine (CYMBALTA) DR capsule 60 mg (not administered)  traZODone (DESYREL) tablet 50 mg (not administered)    Note:  This document was prepared using Dragon voice recognition software and may include unintentional dictation errors.  Alona BeneJoshua Vyolet Sakuma, MD Emergency Medicine   Jacquese Cassarino, Arlyss RepressJoshua G, MD 06/19/17 2153

## 2017-06-19 NOTE — ED Triage Notes (Signed)
Pt with anxiety and SI-denies plan at present.  Pt denies use of ETOH or drugs today.

## 2017-06-19 NOTE — ED Notes (Signed)
[facility]; seeking placement.

## 2017-06-19 NOTE — ED Notes (Signed)
Pt wanded in triage room prior to transport to room in back
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
## 2017-06-19 NOTE — ED Notes (Signed)
Pt wanded second time by security. Belongings locked in locker by NT. TTS in progress at this time, unable to perform initial assessment.

## 2017-06-19 NOTE — ED Notes (Signed)
CN notified of needing a sitter.

## 2017-06-20 MED ORDER — ALPRAZOLAM 0.5 MG PO TABS
1.0000 mg | ORAL_TABLET | Freq: Once | ORAL | Status: AC
  Administered 2017-06-20: 1 mg via ORAL
  Filled 2017-06-20: qty 2

## 2017-06-20 NOTE — BH Assessment (Addendum)
Pt reported he slept well and ate well. Pt reported he woke up with racing thoughts.  Pt denies SI as he reports he just woke up. Pt still meet IP criteria.

## 2017-06-20 NOTE — ED Notes (Signed)
Pt requesting medication for anxiety and headache. Medication given. Will continue to monitor.

## 2017-06-20 NOTE — ED Notes (Signed)
Pt taken to shower, Accompanied by sitter and security

## 2017-06-20 NOTE — ED Notes (Signed)
Assuming care. Pt sleeping. Sitter at bedside

## 2017-06-20 NOTE — ED Notes (Signed)
Received report on pt, pt lying on side, resp even and non labored, no distress noted, sitter remains at bedside,

## 2017-06-20 NOTE — Progress Notes (Signed)
Patient meets criteria for inpatient treatment per Nira ConnJason Berry NP. [facility] at capacity as of 06/20/17. CSW faxed referrals to the following facilities for review:  [facility], [facility], [facility], [facility], [facility], [facility], [facility].   TTS will continue to seek bed placement.   Trula SladeHeather Smart, MSW, LCSW Clinical Social Worker 06/20/2017 8:09 AM

## 2017-06-20 NOTE — ED Notes (Signed)
Pt started c/o some anxiety and  Lower back pain. Pt felt like was beginning to have an attack. Pt medicated, lights turned off in room with sitter in view.

## 2017-06-21 DIAGNOSIS — F332 Major depressive disorder, recurrent severe without psychotic features: Secondary | ICD-10-CM | POA: Diagnosis present

## 2017-06-21 MED ORDER — NICOTINE 21 MG/24HR TD PT24: 21.0000 mg | MEDICATED_PATCH | Freq: Every day | TRANSDERMAL | Status: DC

## 2017-06-21 MED ORDER — HYDROXYZINE HCL 25 MG PO TABS
50.0000 mg | ORAL_TABLET | Freq: Three times a day (TID) | ORAL | Status: DC | PRN
  Administered 2017-06-21: 50 mg via ORAL

## 2017-06-21 MED ORDER — ACETAMINOPHEN 325 MG PO TABS
650.0000 mg | ORAL_TABLET | Freq: Four times a day (QID) | ORAL | Status: DC | PRN
  Administered 2017-06-21: 650 mg via ORAL
  Filled 2017-06-21: qty 2

## 2017-06-21 MED ORDER — ALUM & MAG HYDROXIDE-SIMETH 200-200-20 MG/5ML PO SUSP: 30.0000 mL | ORAL | Status: DC | PRN

## 2017-06-21 MED ORDER — MAGNESIUM HYDROXIDE 400 MG/5ML PO SUSP: 30.0000 mL | Freq: Every day | ORAL | Status: DC | PRN

## 2017-06-21 MED ORDER — TRAZODONE HCL 50 MG PO TABS: 100.0000 mg | ORAL_TABLET | Freq: Every day | ORAL | Status: DC

## 2017-06-21 NOTE — ED Provider Notes (Signed)
10:25 PM patient is alert pleasant cooperative Glasgow Coma Score 15 ambulates without difficulty.  Stable [facility] Dr.Mcnew is accepting physician Results for orders placed or performed during the hospital encounter of 06/19/17  Ethanol  Result Value Ref Range   Alcohol, Ethyl (B) <10 <10 mg/dL  CBC  Result Value Ref Range   WBC 8.1 4.0 - 10.5 K/uL   RBC 4.60 4.22 - 5.81 MIL/uL   Hemoglobin 14.4 13.0 - 17.0 g/dL   HCT 16.143.4 09.639.0 - 04.552.0 %   MCV 94.3 78.0 - 100.0 fL   MCH 31.3 26.0 - 34.0 pg   MCHC 33.2 30.0 - 36.0 g/dL   RDW 40.912.8 81.111.5 - 91.415.5 %   Platelets 242 150 - 400 K/uL  Basic metabolic panel  Result Value Ref Range   Sodium 141 135 - 145 mmol/L   Potassium 4.4 3.5 - 5.1 mmol/L   Chloride 106 101 - 111 mmol/L   CO2 30 22 - 32 mmol/L   Glucose, Bld 112 (H) 65 - 99 mg/dL   BUN 20 6 - 20 mg/dL   Creatinine, Ser 7.820.89 0.61 - 1.24 mg/dL   Calcium 9.8 8.9 - 95.610.3 mg/dL   GFR calc non Af Amer >60 >60 mL/min   GFR calc Af Amer >60 >60 mL/min   Anion gap 5 5 - 15  Rapid urine drug screen (hospital performed)  Result Value Ref Range   Opiates NONE DETECTED NONE DETECTED   Cocaine NONE DETECTED NONE DETECTED   Benzodiazepines NONE DETECTED NONE DETECTED   Amphetamines NONE DETECTED NONE DETECTED   Tetrahydrocannabinol POSITIVE (A) NONE DETECTED   Barbiturates NONE DETECTED NONE DETECTED  Acetaminophen level  Result Value Ref Range   Acetaminophen (Tylenol), Serum <10 (L) 10 - 30 ug/mL  Salicylate level  Result Value Ref Range   Salicylate Lvl <7.0 2.8 - 30.0 mg/dL   No results found.   Doug SouJacubowitz, Hinata Diener, MD 06/21/17 [id number]

## 2017-06-21 NOTE — ED Notes (Signed)
Pt given meal tray.

## 2017-06-21 NOTE — ED Notes (Signed)
Pt ambulated to restroom. 

## 2017-06-21 NOTE — Progress Notes (Signed)
06/21/17 - [facility]  for possible inpatient psychiatric treatment.   06/20/17 - Under Review:  [facility], [facility], [facility], [facility], [facility], [facility], [facility].   TTS will continue to seek bed placement.    Baldo DaubJolan Habiba Treloar MSW, LCSWA CSW Disposition [phone]

## 2017-06-21 NOTE — ED Notes (Signed)
Pt ambulating to rest room.

## 2017-06-21 NOTE — BH Assessment (Signed)
[facility] Assessment Progress Note  Pt reassessed this afternoon. Pt denies current active SI, but endorses racing thoughts with associated passive thoughts ("I wish I weren't here"; "what's the point of living). Pt denies AVH. Initially pt says that he feels safe to be d/c, but then says he's kind of "skeptical towards leaving". IP treatment still recommended, at this time.   Johny ShockSamantha M. Ladona Ridgelaylor, MS, NCC, LPCA Counselor

## 2017-06-21 NOTE — ED Provider Notes (Signed)
Patient asking about results of 2D testing that was performed a week ago.  P reports that he has been out of town and has not heard anything about the results.  I looked at his labs, HIV and RPR were negative, however, it appears that the [facility] and chlamydia was not run.  I discussed retesting with the patient.  He does not want to have a urethral swab performed again, therefore will order GC and Chlamydia on urine.   Gilda CreasePollina, Jayelyn Barno J, MD 06/21/17 [phone]

## 2017-06-21 NOTE — ED Notes (Signed)
Pt requesting vistaril for anxiety. Have given medication. TTS in room

## 2017-06-22 ENCOUNTER — Other Ambulatory Visit: Payer: Self-pay

## 2017-06-22 ENCOUNTER — Inpatient Hospital Stay
Admission: AD | Admit: 2017-06-22 | Discharge: 2017-06-25 | DRG: 885 | Disposition: A | Discharge status: Home / Self Care | Payer: No Typology Code available for payment source | Attending: Psychiatry | Admitting: Psychiatry

## 2017-06-22 DIAGNOSIS — G4700 Insomnia, unspecified: Secondary | ICD-10-CM | POA: Diagnosis present

## 2017-06-22 DIAGNOSIS — F17210 Nicotine dependence, cigarettes, uncomplicated: Secondary | ICD-10-CM | POA: Diagnosis present

## 2017-06-22 DIAGNOSIS — F411 Generalized anxiety disorder: Secondary | ICD-10-CM | POA: Diagnosis present

## 2017-06-22 DIAGNOSIS — F332 Major depressive disorder, recurrent severe without psychotic features: Principal | ICD-10-CM

## 2017-06-22 DIAGNOSIS — F329 Major depressive disorder, single episode, unspecified: Secondary | ICD-10-CM | POA: Diagnosis present

## 2017-06-22 DIAGNOSIS — J45909 Unspecified asthma, uncomplicated: Secondary | ICD-10-CM | POA: Diagnosis present

## 2017-06-22 DIAGNOSIS — R45851 Suicidal ideations: Secondary | ICD-10-CM | POA: Diagnosis present

## 2017-06-22 DIAGNOSIS — Z818 Family history of other mental and behavioral disorders: Secondary | ICD-10-CM | POA: Diagnosis not present

## 2017-06-22 LAB — GC/CHLAMYDIA PROBE AMP (~~LOC~~) NOT AT ARMC
CHLAMYDIA, DNA PROBE: NEGATIVE
NEISSERIA GONORRHEA: NEGATIVE

## 2017-06-22 MED ORDER — PAROXETINE HCL 10 MG PO TABS
10.0000 mg | ORAL_TABLET | Freq: Every day | ORAL | Status: DC
  Administered 2017-06-22 – 2017-06-24 (×3): 10 mg via ORAL
  Filled 2017-06-22 (×3): qty 1

## 2017-06-22 MED ORDER — ALUM & MAG HYDROXIDE-SIMETH 200-200-20 MG/5ML PO SUSP: 30.0000 mL | ORAL | Status: DC | PRN

## 2017-06-22 MED ORDER — HYDROXYZINE HCL 50 MG PO TABS
50.0000 mg | ORAL_TABLET | Freq: Four times a day (QID) | ORAL | Status: DC | PRN
  Administered 2017-06-22 – 2017-06-25 (×6): 50 mg via ORAL
  Filled 2017-06-22 (×6): qty 1

## 2017-06-22 MED ORDER — GABAPENTIN 600 MG PO TABS
600.0000 mg | ORAL_TABLET | Freq: Three times a day (TID) | ORAL | Status: DC
  Administered 2017-06-22 – 2017-06-25 (×9): 600 mg via ORAL
  Filled 2017-06-22 (×9): qty 1

## 2017-06-22 MED ORDER — ACETAMINOPHEN 325 MG PO TABS
650.0000 mg | ORAL_TABLET | Freq: Four times a day (QID) | ORAL | Status: DC | PRN
  Administered 2017-06-23 – 2017-06-24 (×2): 650 mg via ORAL
  Filled 2017-06-22 (×2): qty 2

## 2017-06-22 MED ORDER — TRAZODONE HCL 100 MG PO TABS: 100.0000 mg | ORAL_TABLET | Freq: Every evening | ORAL | Status: DC | PRN

## 2017-06-22 MED ORDER — MAGNESIUM HYDROXIDE 400 MG/5ML PO SUSP: 30.0000 mL | Freq: Every day | ORAL | Status: DC | PRN

## 2017-06-22 NOTE — [facility] Suicide Risk Assessment (Signed)
[facility]   Nursing information obtained from:    Demographic factors:    Current Mental Status:    Loss Factors:    Historical Factors:    Risk Reduction Factors:     Total Time spent with patient: 1 hour Principal Problem: MDD (major depressive disorder) Diagnosis:   Patient Active Problem List   Diagnosis Date Noted  . MDD (major depressive disorder) [F32.9] 06/22/2017  . Major depressive disorder, recurrent severe without psychotic features (HCC) [F33.2] 06/21/2017  . Severe episode of recurrent major depressive disorder, without psychotic features (HCC) [F33.2]   . Bipolar disorder, unspecified (HCC) [F31.9] 03/23/2017  . Depression, major, recurrent, in partial remission (HCC) [F33.41] 06/03/2011  . ELBOW SPRAIN, LEFT [[id number]] 09/17/2010   Subjective Data: See H&P  Continued Clinical Symptoms:  Alcohol Use Disorder Identification Test Final Score (AUDIT): 9 The "Alcohol Use Disorders Identification Test", Guidelines for Use in Primary Care, Second Edition.  World Science writerHealth Organization [facility](WHO). Score between 0-7:  no or low risk or alcohol related problems. Score between 8-15:  moderate risk of alcohol related problems. Score between 16-19:  high risk of alcohol related problems. Score 20 or above:  warrants further diagnostic evaluation for alcohol dependence and treatment.   CLINICAL FACTORS:   Depression:   Anhedonia Hopelessness   Musculoskeletal: Strength & Muscle Tone: within normal limits Gait & Station: normal Patient leans: Right  Psychiatric Specialty Exam: Physical Exam  Nursing note and vitals reviewed.   ROS                                                            COGNITIVE FEATURES THAT CONTRIBUTE TO RISK:  None    SUICIDE RISK:   Moderate:  Frequent suicidal ideation with limited intensity, and duration, some specificity in terms of plans, no associated intent, good self-control, limited  dysphoria/symptomatology, some risk factors present, and identifiable protective factors, including available and accessible social support.  PLAN OF CARE: See H&P  I certify that inpatient services furnished can reasonably be expected to improve the patient's condition.   Haskell RilingHolly R Kamayah Pillay, MD 06/22/2017, 3:51 PM

## 2017-06-22 NOTE — [facility] Group Notes (Signed)
Goals Group Date/Time: 06/22/2017 9:00 AM Type of Therapy and Topic: Group Therapy: Goals Group: SMART Goals   Participation Level: Moderate  Description of Group:    The purpose of a daily goals group is to assist and guide patients in setting recovery/wellness-related goals. The objective is to set goals as they relate to the crisis in which they were admitted. Patients will be using SMART goal modalities to set measurable goals. Characteristics of realistic goals will be discussed and patients will be assisted in setting and processing how one will reach their goal. Facilitator will also assist patients in applying interventions and coping skills learned in psycho-education groups to the SMART goal and process how one will achieve defined goal.   Therapeutic Goals:   -Patients will develop and document one goal related to or their crisis in which brought them into treatment.  -Patients will be guided by LCSW using SMART goal setting modality in how to set a measurable, attainable, realistic and time sensitive goal.  -Patients will process barriers in reaching goal.  -Patients will process interventions in how to overcome and successful in reaching goal.   Patient's Goal: to sleep and get rid of my anxiety.   Therapeutic Modalities:  Motivational Interviewing  Research officer, political partyCognitive Behavioral Therapy  Crisis Intervention Model  SMART goals setting   Daleen SquibbGreg Samantha Olivera, [place]CSW

## 2017-06-22 NOTE — H&P (Signed)
Psychiatric Admission Assessment Adult  Patient Identification: Marc [name] MRN:  [id number] Date of Evaluation:  06/22/2017 Chief Complaint:  Depression Principal Diagnosis: MDD (major depressive disorder) Diagnosis:   Patient Active Problem List   Diagnosis Date Noted  . MDD (major depressive disorder) [F32.9] 06/22/2017  . Major depressive disorder, recurrent severe without psychotic features (HCC) [F33.2] 06/21/2017  . Severe episode of recurrent major depressive disorder, without psychotic features (HCC) [F33.2]   . Bipolar disorder, unspecified (HCC) [F31.9] 03/23/2017  . Depression, major, recurrent, in partial remission (HCC) [F33.41] 06/03/2011  . ELBOW SPRAIN, LEFT [[id number]] 09/17/2010   History of Present Illness:21 yo male with history of depression admitted due to worsening depression and SI. Pt states that he has been depressed for "most of my life." This has been worsening lately. He states that his household is chaotic and lives with multiple people including his grandmother who has dementia. He sleeps on the couch. HE states that he does not have much of a support system. He states that he also cannot find a job and wants to work. He states that his relationships have always been unstable and has trust issues with females. He states that he is not close with any of his brothers and wish he had a better relationship with them. HE is not very close to his mother but they are trying to work on fixing this but he does not get along with her boyfriend. He lives with his grandmother, his cousin, his grandmothers husband and several dogs. He is not allowed to shower more than 2 times a week due to all the people there. He states that he was hospital,lization at [facility] in September. He did not fill his prescriptions because "I felt like I was doing better without them and I lost the prescription." He states that medications have never helped and he struggles with a lot of anxiety  and panic attacks. He buys Xanax off the streets to help with anxiety. He states that he uses about 1-2 mg "a few times a month." He also buys Roxicodone and hydrocodone on the streets to help with back pain. He takes these about 1-2 ties a week. He reports passive SI but "I often think about not wanting to be here." HE states that he does not do anything to hurt himself because "I do not want to kill myself because there are things I want to accomplish and I know if I'm dead I wont be able to do these. Theres been a lot of barriers to these things but I know I can do it." He states that he wants to become a International aid/development workerpeer counselor. HE states that he has to get his GED first and he is 2 test away from getting it. He then can start classes for it. He states that he "used to be suicidal all the time." However, since last hospitalization he has been "able to learn to deal with depression and manage suicidal thoughts better." He started seeing a counselor [facility] and he will start medication management there soon. He has an upcoming appointment.   Associated Signs/Symptoms: Depression Symptoms:  depressed mood, anhedonia, insomnia, fatigue, feelings of worthlessness/guilt, suicidal thoughts without plan, anxiety, (Hypo) Manic Symptoms:  Denies recent symptoms. He reports in the past vague symptosm of decreased need for sleep and euphoria however he was using cocaine during this time. No clear episodes of mania. Anxiety Symptoms:  Excessive Worry, Panic Symptoms, Psychotic Symptoms:  None PTSD Symptoms: Negative  Total Time spent with patient: 1 hour  Past Psychiatric History: History of bipolar disorder and MDD. He started seeing providers at [facility]. HE has been inpatient a few times and most recent was at [facility] in September. He was discharged on Cymbalta, Gabapentin and vistaril. HE reports 1 suicide attempt by "stabbing myself in the leg but I was on drugs too."   Is the patient at risk  to self? Yes.    Has the patient been a risk to self in the past 6 months? Yes.    Has the patient been a risk to self within the distant past? Yes.    Is the patient a risk to others? No.  Has the patient been a risk to others in the past 6 months? No.  Has the patient been a risk to others within the distant past? No.   Alcohol Screening: Patient refused Alcohol Screening Tool: Yes 1. How often do you have a drink containing alcohol?: 2 to 4 times a month 2. How many drinks containing alcohol do you have on a typical day when you are drinking?: 3 or 4 3. How often do you have six or more drinks on one occasion?: Less than monthly AUDIT-C Score: 4 4. How often during the last year have you found that you were not able to stop drinking once you had started?: Less than monthly 5. How often during the last year have you failed to do what was normally expected from you becasue of drinking?: Less than monthly 6. How often during the last year have you needed a first drink in the morning to get yourself going after a heavy drinking session?: Less than monthly 7. How often during the last year have you had a feeling of guilt of remorse after drinking?: Less than monthly 8. How often during the last year have you been unable to remember what happened the night before because you had been drinking?: Less than monthly 9. Have you or someone else been injured as a result of your drinking?: No 10. Has a relative or friend or a doctor or another health worker been concerned about your drinking or suggested you cut down?: No Alcohol Use Disorder Identification Test Final Score (AUDIT): 9 Substance Abuse History in the last 12 months:  Yes.  ,Xanax and Opiates, See H&P. History of cocaine abuse. He also used to be a drug dealer  Consequences of Substance Abuse: Negative Previous Psychotropic Medications: Yes  Psychological Evaluations: Yes  Past Medical History:  Past Medical History:  Diagnosis Date   . Anxiety   . Asthma   . Depressed   . Polysubstance abuse (HCC)   . Seizures (HCC)   . Wears glasses     Past Surgical History:  Procedure Laterality Date  . COLON SURGERY    . FRACTURE SURGERY     Family History:  Family History  Problem Relation Age of Onset  . Anxiety disorder Father   . Depression Father   . Anxiety disorder Paternal Grandfather   . Anxiety disorder Mother   . Depression Mother   . Depression Cousin   . Drug abuse Cousin    Family Psychiatric  History: Unknown Tobacco Screening: Have you used any form of tobacco in the last 30 days? (Cigarettes, Smokeless Tobacco, Cigars, and/or Pipes): Yes Tobacco use, Select all that apply: 5 or more cigarettes per day Are you interested in Tobacco Cessation Medications?: Yes, will notify MD for an order Counseled patient on  smoking cessation including recognizing danger situations, developing coping skills and basic information about quitting provided: Yes Soc    Social: From [place]. His father died when pt was [age]. He was not close to him. He is not close to his mother. He lives with his great grandmother and several other people in the house and is very chaotic. HE is not working currently but is wanting to be a International aid/development workerpeer counselor. He went to 8th grade and is working on getting his GED. He has 4 siblings but not close to any of them. He is not in a relationship and never married. No kids.                 Allergies:   Allergies  Allergen Reactions  . Promethazine Hcl Other (See Comments)    Seizures    Lab Results:  Results for orders placed or performed during the hospital encounter of 06/19/17 (from the past 48 hour(s))  GC/Chlamydia probe amp ([place])not at [facility]     Status: None   Collection Time: 06/21/17 12:00 AM  Result Value Ref Range   Chlamydia Negative     Comment: Normal Reference Range - Negative   Neisseria gonorrhea Negative     Comment: Normal Reference Range - Negative    Blood  Alcohol level:  Lab Results  Component Value Date   ETH <10 06/19/2017   ETH <5 03/21/2017    Metabolic Disorder Labs:  Lab Results  Component Value Date   HGBA1C 5.6 04/28/2011   MPG 114 04/28/2011   No results found for: PROLACTIN Lab Results  Component Value Date   CHOL 140 04/28/2011   TRIG 55 04/28/2011   HDL 48 04/28/2011   CHOLHDL 2.9 04/28/2011   VLDL 11 04/28/2011   LDLCALC 81 04/28/2011    Current Medications: Current Facility-Administered Medications  Medication Dose Route Frequency Provider Last Rate Last Dose  . acetaminophen (TYLENOL) tablet 650 mg  650 mg Oral Q6H PRN Elara Cocke, Ileene HutchinsonHolly R, MD      . alum & mag hydroxide-simeth (MAALOX/MYLANTA) 200-200-20 MG/5ML suspension 30 mL  30 mL Oral Q4H PRN Natavia Sublette, Ileene HutchinsonHolly R, MD      . gabapentin (NEURONTIN) tablet 600 mg  600 mg Oral TID Malijah Lietz, Ileene HutchinsonHolly R, MD      . hydrOXYzine (ATARAX/VISTARIL) tablet 50 mg  50 mg Oral Q6H PRN Sigfredo Schreier R, MD      . magnesium hydroxide (MILK OF MAGNESIA) suspension 30 mL  30 mL Oral Daily PRN Shadiamond Koska R, MD      . PARoxetine (PAXIL) tablet 10 mg  10 mg Oral QHS Delta Pichon R, MD      . traZODone (DESYREL) tablet 100 mg  100 mg Oral QHS PRN Makena Mcgrady, Ileene HutchinsonHolly R, MD       PTA Medications: Medications Prior to Admission  Medication Sig Dispense Refill Last Dose  . HYDROcodone-acetaminophen (NORCO) 10-325 MG tablet Take 1 tablet by mouth every 6 (six) hours as needed for moderate pain.   Past Week at Unknown time  . ibuprofen (ADVIL,MOTRIN) 200 MG tablet Take 400-800 mg by mouth every 6 (six) hours as needed for headache or moderate pain.   Past Week at Unknown time    Musculoskeletal: Strength & Muscle Tone: within normal limits Gait & Station: normal Patient leans: N/A  Psychiatric Specialty Exam: Physical Exam  Nursing note and vitals reviewed.   Review of Systems  All other systems reviewed and are negative.   Blood pressure  111/69, pulse 61, temperature 98 F (36.7 C), resp. rate  18, height 5\' 5"  (1.651 m), weight 54.9 kg (121 lb), SpO2 97 %.Body mass index is 20.14 kg/m.  General Appearance: Disheveled  Eye Contact:  Fair  Speech:  Clear and Coherent  Volume:  Normal  Mood:  Depressed  Affect:  Constricted  Thought Process:  Coherent  Orientation:  Full (Time, Place, and Person)  Thought Content:  Negative  Suicidal Thoughts:  Yes.  without intent/plan  Homicidal Thoughts:  No  Memory:  Immediate;   Fair  Judgement:  Fair  Insight:  Fair  Psychomotor Activity:  Normal  Concentration:  Attention Span: Fair  Recall:  [employer] of Knowledge:  Fair  Language:  Poor  Akathisia:  No      Assets:  Communication Skills Desire for Improvement  ADL's:  Intact  Cognition:  WNL  Sleep:  Number of Hours: 3.3    Treatment Plan Summary: 21 yo male admitted due to SI. Pt reports history of bipolar disorder but there is no evidence of clear manic episodes. He has long history of depression and anxiety and panic attacks. HE has history of ego instability, unstable past relationships, mistrust in others, poor self esteem and suicidal thoughts to deal with these issues. He does exhibit some traits of borderline personality disorder but unclear at this time. Depression and anxiety is the primary issue at this time.   Plan:  Depression/Anxiety -Start Paxil 10 mg daily -Increase Gabapentin to 600 mg TID and continue to increase if tolerated -prn hydroxyzine  Insomnia -prn trazodone  Dispo -He will return to his great grandmothers house on discharge. He states that he does not have much of a support system. He will follow up with [facility].   Observation Level/Precautions:  15 minute checks  Laboratory:  Done in ED  Psychotherapy:    Medications:    Consultations:    Discharge Concerns:    Estimated LOS:3-5 days  Other:     Physician Treatment Plan for Primary Diagnosis: MDD (major depressive disorder) Long Term Goal(s): Improvement in symptoms so as ready  for discharge  Short Term Goals: Ability to verbalize feelings will improve and Ability to disclose and discuss suicidal ideas  Physician Treatment Plan for Secondary Diagnosis: Active Problems:   MDD (major depressive disorder)  Long Term Goal(s): Improvement in symptoms so as ready for discharge  Short Term Goals: Ability to identify changes in lifestyle to reduce recurrence of condition will improve, Ability to verbalize feelings will improve, Ability to disclose and discuss suicidal ideas, Ability to demonstrate self-control will improve, Ability to identify and develop effective coping behaviors will improve, Ability to maintain clinical measurements within normal limits will improve and Ability to identify triggers associated with substance abuse/mental health issues will improve  I certify that inpatient services furnished can reasonably be expected to improve the patient's condition.    Haskell Riling, MD 12/4/20183:34 PM

## 2017-06-22 NOTE — Progress Notes (Signed)
Recreation Therapy Notes   Date: 12.04.2018  Time: 1:00pm  Location: Craft Room  Behavioral response: N/A  Intervention Topic: Goals  Discussion/Intervention: Patient did not attend group. Clinical Observations/Feedback:  Patient did not attend group.  Kada Friesen LRT/CTRS          Kasten Leveque 06/22/2017 2:21 PM

## 2017-06-22 NOTE — [facility] Suicide Risk Assessment (Signed)
[facility] INPATIENT:  Family/Significant Other Suicide Prevention Education  Suicide Prevention Education:  Patient Refusal for Family/Significant Other Suicide Prevention Education: The patient Marc [name] has refused to provide written consent for family/significant other to be provided Family/Significant Other Suicide Prevention Education during admission and/or prior to discharge.  Physician notified.  Pt stated, "I don't have anyone. My grandmother has dementia." CSW reviewed SPE pamphlet with pt, who expressed understanding and agreement with information provided. CSW will continue to encourage pt to involve supports in his treatment.   Heidi DachKelsey Emberlyn Burlison, LCSW 06/22/2017, 9:39 AM

## 2017-06-22 NOTE — Progress Notes (Signed)
Patient was admitted to the unit with normal calm behavior ,talks well about self and follows routine admission process without difficulties. Patient appeared depressed but denies any suicidal thoughts and ideas, his affect is flat, no signs of hallucinations, delusions or bizarre behaviors, association are intact, thinking is logical and thought content is appropriate, assessment is complete no skin issues, two RNs Alex/Bukola conduct search  And no weapons is found, patient contract for safety of self and others noted

## 2017-06-22 NOTE — Progress Notes (Signed)
Recreation Therapy Notes  INPATIENT RECREATION THERAPY ASSESSMENT  Patient Details Name: Marc [name] MRN: [id number] DOB: 04/05/1996 Today's Date: 06/22/2017  Patient Stressors: Family, Relationship, Friends, Work, Other (Comment)(Living Situation, Being alone)  Coping Skills:   Music, Other (Comment)(Skate board)  Personal Challenges: Anger, Communication, Concentration, Relationships, Self-Esteem/Confidence, Stress Management, Trusting Others, Work Nutritional therapisterformance  Leisure Interests (2+):  Music - Listen, Individual - TV, Sports - Exercise (Comment)(Skateboard)  Awareness of Community Resources:  No  Community Resources:     Current Use: No  If no, Barriers?:    Patient Strengths:  Helping others  Patient Identified Areas of Improvement:  Control anxiety and depression  Current Recreation Participation:  Animal nutritionistkate board  Patient Goal for Hospitalization:  Deal with my problems  [place]  [place]  County [place] Residence:  [place]   Current [place] (including self-harm):  No  Current HI:  No  Consent to Intern Participation: N/A   [name] 06/22/2017, 12:18 PM

## 2017-06-22 NOTE — [facility] Group Notes (Signed)
06/22/2017 9:30am  Type of Therapy/Topic:  Group Therapy:  Feelings about Diagnosis  Participation Level:  Minimal   Description of Group:   This group will allow patients to explore their thoughts and feelings about diagnoses they have received. Patients will be guided to explore their level of understanding and acceptance of these diagnoses. Facilitator will encourage patients to process their thoughts and feelings about the reactions of others to their diagnosis and will guide patients in identifying ways to discuss their diagnosis with significant others in their lives. This group will be process-oriented, with patients participating in exploration of their own experiences, giving and receiving support, and processing challenge from other group members.   Therapeutic Goals: 1. Patient will demonstrate understanding of diagnosis as evidenced by identifying two or more symptoms of the disorder 2. Patient will be able to express two feelings regarding the diagnosis 3. Patient will demonstrate their ability to communicate their needs through discussion and/or role play  Summary of Patient Progress:   Stayed the entire time, minimum engagement.  Marc [name] says that he is "feeling anxious and how he cannot stop thinking". He also states that his symptoms for his diagnosis include talking more or less. Mood was good.    Therapeutic Modalities:   Cognitive Behavioral Therapy Brief Therapy Feelings Identification    [name], LCSW 06/22/2017 10:23 AM

## 2017-06-22 NOTE — B[facility] Counselor (Signed)
Adult Comprehensive Assessment  Patient ID: Marc [name], male   DOB: 05/15/1996, 21 y.o.   MRN: [id number]  Information Source: Information source: Patient   Current Stressors:  Educational / Learning stressors: None reported.  Employment / Job issues: "that's another problem, I don't have a job and can't get one."   Family Relationships: strained with grandmother and grandfather. strained with mother- I don't really have contact with her. father deceased. no contact with brothers Financial / Lack of resources (include bankruptcy): No insurance, no income.  Housing / Lack of housing: lives with grandma--"I don't know how much longer I'll be there. She kicks me out all the time." Physical health (include injuries & life threatening diseases): back pain from work accident Social relationships: poor-few friends in community Substance abuse: alcohol 80oz on average daily; marijuana daily; "I buy xanax and pain pills to self medicate. I have bad anxiety and pain in my back." Bereavement / Loss: death of friend to heroin recently. grandfather who raised him and father are deceased. "I took those deaths really hard."   Living/Environment/Situation:  Living Arrangements: Other relatives Living conditions (as described by patient or guardian): living with grandmother for the past two months. How long has patient lived in current situation?: two months. prior to this, was staying with friend.  What is atmosphere in current home: Temporary. Chaotic.   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 my dad died in 2012.  Description of patient's relationship with caregiver when they were a child: close to grandfather and father until his death in 2012. strained with mother Patient's description of current relationship with people who raised him/her: strained with grandmother with whom he currently lives. father deceased. grandfather deceased. strained with mother. "I have as little contact as possible with her. she makes me very anxious." How were you disciplined when you got in trouble as a child/adolescent?: yelled at.  Does patient have siblings?: Yes Number of Siblings: 4 Description of patient's current relationship with siblings: all brothers; one in prison; one in the military; no contact with other two siblings.  Did patient suffer any verbal/emotional/physical/sexual abuse as a child?: No Did patient suffer from severe childhood neglect?: No Has patient ever been sexually abused/assaulted/raped as an adolescent or adult?: No Was the patient ever a victim of a crime or a disaster?: No Witnessed domestic violence?: No Has patient been effected by domestic violence as an adult?: No  Education:  Highest grade of school patient has completed: 8th grade Currently a student?: No Learning disability?: No  Employment/Work Situation:   Employment situation: Unemployed Where is patient currently employed?: N/A How long has patient been employed?: N/A  Patient's job has been impacted by current illness: Pt reports he is unable to hold a job.  What is the longest time patient has a held a job?: few months Where was the patient employed at that time?: landscaping  Has patient ever been in the [employer] and Companymilitary?: No Has patient ever served in combat?: No Did You Receive Any Psychiatric Treatment/Services While in Equities traderthe Military?: No Are There Guns or Other Weapons in Your Home?: No Are These Weapons  Safely Secured?:  (n/a)  Financial Resources:   Financial resources: Income from  employment, Support from parents / caregiver Does patient have a Lawyerrepresentative payee or guardian?: No  Alcohol/Substance Abuse:   What has been your use of drugs/alcohol within the last 12 months?: alcohol-about 80 oz daily; marijuana daily; opiates/pain pills and xanax "I buy them off the street because it's all that helps my anxiety and back pain." pt minimizes substance abuse.  If attempted suicide, did drugs/alcohol play a role in this?: Yes (2012) Alcohol/Substance Abuse Treatment Hx: Past Tx, Inpatient, Past Tx, Outpatient If yes, describe treatment: 03[facility]2017, [facility] after saying I was suicidal and doing drugs. " [facility]  Has alcohol/substance abuse ever caused legal problems?: Yes (misdeamor marijuana possession).  Social Support System:   Forensic psychologistatient's Community Support System: Poor Describe Community Support System: few close friends Type of faith/religion: christian How does patient's faith help to cope with current illness?: prayer  Leisure/Recreation:   Leisure and Hobbies: spending time outside  Strengths/Needs:   What things does the patient do well?: "I don't know." In what areas does patient struggle / problems for patient: coping with loss; pain issues  Discharge Plan:   Does patient have access to transportation?: Yes (walk or grandmother will drive him.) Will patient be returning to same living situation after discharge?: Yes Currently receiving community mental health services: Yes, [facility] [place]  If no, would patient like referral for services when discharged?: Yes (What county?) [employer] Does patient have financial barriers related to discharge medications?: Yes Patient description of barriers related to discharge medications: limited income/ no insurance   Summary/Recommendations:   Summary  and Recommendations (to be completed by the evaluator): Pt is a 21 year old, single male who presents to [facility] for increased anxiety and depression. Upon admission, pt reported +SI with no plan; however, pt currently denies SI, HI, and hallucinations. Pt reports, "having a lot of problems, and I just over think everything." Pt reports using pain pills, Xanax, alcohol, and THC, "whenever I can." [facility] and he completed 14 days for substance use tx. Pt reported that following his discharge, he stopped taking all psych meds and stated they "weren't helping at all." Pt is currently living with his grandmother and believes he is able to return, "but she kicks me out every other day." Pt's current diagnosis is: Major Depressive Disorder.  Current recommendations for this patient include: crisis stabilization, encouragement to attend and participate in group therapy, therapeutic milieu, and the development of a comprehensive mental wellness and recovery plan.   Heidi DachKelsey Devin Foskey, LCSW. 06/22/2017

## 2017-06-22 NOTE — Tx Team (Signed)
Initial Treatment Plan 06/22/2017 2:42 AM Marc [name] OZH:[id number]    PATIENT STRESSORS: Financial difficulties Medication change or noncompliance Occupational concerns Substance abuse   PATIENT STRENGTHS: Average or above average intelligence Capable of independent living Communication skills Motivation for treatment/growth Supportive family/friends Work skills   PATIENT IDENTIFIED PROBLEMS: Alcohol use    Illicit Drug Use, Marijuana, Benzo, Opoid.      Chronic Back Pain    Cigarette Smoker         DISCHARGE CRITERIA:  Ability to meet basic life and health needs Adequate post-discharge living arrangements Improved stabilization in mood, thinking, and/or behavior Reduction of life-threatening or endangering symptoms to within safe limits Verbal commitment to aftercare and medication compliance  PRELIMINARY DISCHARGE PLAN: Attend aftercare/continuing care group Attend 12-step recovery group Participate in family therapy Return to previous living arrangement  PATIENT/FAMILY INVOLVEMENT: This treatment plan has been presented to and reviewed with the patient, Marc [name],.  The patient  have been given the opportunity to ask questions and make suggestions.  Lelan PonsAlexander  Kumar Falwell, RN 06/22/2017, 2:42 AM

## 2017-06-22 NOTE — Progress Notes (Signed)
Received [name] this AM after breakfast and the community meeting. He returned to bed. He endorsed feeling anxious and depressed, but denied feeling suicidal. His affect is flat. He got up this PM, compliant with his medications, ate dinner and returned to his room. Earlier he went outside with the group for approximately 10 minutes.

## 2017-06-23 NOTE — [facility] Group Notes (Signed)
[facility] Group Notes:  (Nursing/MHT/Case Management/Adjunct)  Date:  06/23/2017  Time:  1:55 AM  Type of Therapy:  Psychoeducational Skills  Participation Level:  Active  Participation Quality:  Appropriate  Affect:  Appropriate  Cognitive:  Appropriate  Insight:  Good  Engagement in Group:  Engaged  Modes of Intervention:  Discussion, Socialization and Support  Summary of Progress/Problems:  Wilson SingerJustin  Tamer Baughman 06/23/2017, 1:55 AM

## 2017-06-23 NOTE — Progress Notes (Signed)
Recreation Therapy Notes  Date: 12.05.2018  Time: 3:00pm  Location: Craft room  Behavioral response: Appropriate  Group Type: Art/Craft  Participation level: Active  Communication: Patient was social with peers and staff.  Comments: N/A  Marc [name] LRT/CTRS        Marc [name] 06/23/2017 4:18 PM 

## 2017-06-23 NOTE — Progress Notes (Addendum)
[facility]  06/23/2017 2:43 PM Marc [name]  MRN:  [id number] Subjective:  Pt states that he is feeling slightly better today. Anxiety is under better control and mood is improving. He states that groups have been helpful. Pt has great insight into himself and has been trying to learn skills to help him manage suicidal thoughts and mood. He denies SI currently. He slept well last night. He states that he has been trying to think more positively and "I am here (meaning in the world) for a reason." He wants to go to college and be a peer support. He is tolerating Paxil well with no side effects.   Principal Problem: Major depressive disorder, recurrent severe without psychotic features (HCC) Diagnosis:   Patient Active Problem List   Diagnosis Date Noted  . MDD (major depressive disorder) [F32.9] 06/22/2017  . Major depressive disorder, recurrent severe without psychotic features (HCC) [F33.2] 06/21/2017  . Severe episode of recurrent major depressive disorder, without psychotic features (HCC) [F33.2]   . Bipolar disorder, unspecified (HCC) [F31.9] 03/23/2017  . Depression, major, recurrent, in partial remission (HCC) [F33.41] 06/03/2011  . ELBOW SPRAIN, LEFT [[id number]] 09/17/2010   Total Time spent with patient: 20 minutes  Past Psychiatric History: See H&P  Past Medical History:  Past Medical History:  Diagnosis Date  . Anxiety   . Asthma   . Depressed   . Polysubstance abuse (HCC)   . Seizures (HCC)   . Wears glasses     Past Surgical History:  Procedure Laterality Date  . COLON SURGERY    . FRACTURE SURGERY     Family History:  Family History  Problem Relation Age of Onset  . Anxiety disorder Father   . Depression Father   . Anxiety disorder Paternal Grandfather   . Anxiety disorder Mother   . Depression Mother   . Depression Cousin   . Drug abuse Cousin    Family Psychiatric  History: See H&P Social History:  Social History   Substance and Sexual  Activity  Alcohol Use Yes   Comment: very rarely per pt     Social History   Substance and Sexual Activity  Drug Use Yes  . Types: Marijuana   Comment: occ use 06/19/17    Social History   Socioeconomic History  . Marital status: Single    Spouse name: None  . Number of children: None  . Years of education: None  . Highest education level: None  Social Needs  . Financial resource strain: None  . Food insecurity - worry: None  . Food insecurity - inability: None  . Transportation needs - medical: None  . Transportation needs - non-medical: None  Occupational History  . None  Tobacco Use  . Smoking status: Current Every Day Smoker    Packs/day: 2.00    Types: Cigarettes  . Smokeless tobacco: Never Used  Substance and Sexual Activity  . Alcohol use: Yes    Comment: very rarely per pt  . Drug use: Yes    Types: Marijuana    Comment: occ use 06/19/17  . Sexual activity: No  Other Topics Concern  . None  Social History Narrative  . None   Additional Social History:                         Sleep: Good  Appetite:  Good  Current Medications: Current Facility-Administered Medications  Medication Dose Route Frequency Provider Last Rate Last Dose  .  acetaminophen (TYLENOL) tablet 650 mg  650 mg Oral Q6H PRN Rose Hippler, Ileene HutchinsonHolly R, MD      . alum & mag hydroxide-simeth (MAALOX/MYLANTA) 200-200-20 MG/5ML suspension 30 mL  30 mL Oral Q4H PRN Rosevelt Luu R, MD      . gabapentin (NEURONTIN) tablet 600 mg  600 mg Oral TID Haskell RilingMcNew, Amirr Achord R, MD   600 mg at 06/23/17 0801  . hydrOXYzine (ATARAX/VISTARIL) tablet 50 mg  50 mg Oral Q6H PRN Haskell RilingMcNew, Lazarius Rivkin R, MD   50 mg at 06/23/17 0801  . magnesium hydroxide (MILK OF MAGNESIA) suspension 30 mL  30 mL Oral Daily PRN Brandee Markin R, MD      . PARoxetine (PAXIL) tablet 10 mg  10 mg Oral QHS Umi Mainor, Ileene HutchinsonHolly R, MD   10 mg at 06/22/17 2136  . traZODone (DESYREL) tablet 100 mg  100 mg Oral QHS PRN Houa Ackert, Ileene HutchinsonHolly R, MD        Lab Results: No  results found for this or any previous visit (from the past 48 hour(s)).  Blood Alcohol level:  Lab Results  Component Value Date   ETH <10 06/19/2017   ETH <5 03/21/2017    Metabolic Disorder Labs: Lab Results  Component Value Date   HGBA1C 5.6 04/28/2011   MPG 114 04/28/2011   No results found for: PROLACTIN Lab Results  Component Value Date   CHOL 140 04/28/2011   TRIG 55 04/28/2011   HDL 48 04/28/2011   CHOLHDL 2.9 04/28/2011   VLDL 11 04/28/2011   LDLCALC 81 04/28/2011    Physical Findings: AIMS:  , ,  ,  ,    CIWA:  CIWA-Ar Total: 2 COWS:  COWS Total Score: 2  Musculoskeletal: Strength & Muscle Tone: within normal limits Gait & Station: normal Patient leans: N/A  Psychiatric Specialty Exam: Physical Exam  Nursing note and vitals reviewed.   ROS  Blood pressure 119/66, pulse 68, temperature 98.2 F (36.8 C), temperature source Oral, resp. rate 18, height 5\' 5"  (1.651 m), weight 54.9 kg (121 lb), SpO2 97 %.Body mass index is 20.14 kg/m.  General Appearance: Casual  Eye Contact:  Good  Speech:  Clear and Coherent  Volume:  Normal  Mood:  Depressed  Affect:  Congruent  Thought Process:  Goal Directed  Orientation:  Full (Time, Place, and Person)  Thought Content:  Negative  Suicidal Thoughts:  No  Homicidal Thoughts:  No  Memory:  Immediate;   Fair  Judgement:  Fair  Insight:  Good  Psychomotor Activity:  Normal  Concentration:  Concentration: Fair  Recall:  [employer]Fair  Fund of Knowledge:  Fair  Language:  Fair        Assets:  Communication Skills Desire for Improvement Housing  ADL's:  Intact  Cognition:  WNL  Sleep:  Number of Hours: 6.45     Treatment Plan Summary: 21 yo male admitted due to SI. Mood and anxiety are improving and he is tolerating medications well.  MDD/Anxiety -Continue Paxil 10 mg daily -Continue Gabapentin 600 mg TID -prn Vistaril  Dispo -Pt will return to grandmothers house on discharge. [facility]  Haskell RilingHolly R Dawon Troop, MD 06/23/2017, 2:43 PM

## 2017-06-23 NOTE — Progress Notes (Signed)
Patient ID: Marc [name], male   DOB: 30-Jan-1996, 21 y.o.   MRN: [id number] Visible in the milieu, pleasant on approach, A&Ox3, no behavioral problems, interacting well with peers, attended the Wrap-Up Group; denied SI/HI/AVH, complied with medications.

## 2017-06-23 NOTE — [facility] Group Notes (Signed)
[facility] Group Notes:  (Nursing/MHT/Case Management/Adjunct)  Date:  06/23/2017  Time:  9:54 PM  Type of Therapy:  Group Therapy  Participation Level:  Active  Participation Quality:  Appropriate  Affect:  Appropriate  Cognitive:  Alert  Insight:  Good  Engagement in Group:  Engaged  Modes of Intervention:  Support  Summary of Progress/Problems:  Marc [name] 06/23/2017, 9:54 PM

## 2017-06-23 NOTE — Plan of Care (Signed)
Patient slept for Estimated Hours of 6.45; Precautionary checks every 15 minutes for safety maintained, room free of safety hazards, patient sustains no injury or falls during this shift.  

## 2017-06-23 NOTE — [facility] Group Notes (Signed)
  06/23/2017  Time: 0930  Type of Therapy/Topic:  Group Therapy:  Emotion Regulation  Participation Level:  Active   Description of Group:    The purpose of this group is to assist patients in learning to regulate negative emotions and experience positive emotions. Patients will be guided to discuss ways in which they have been vulnerable to their negative emotions. These vulnerabilities will be juxtaposed with experiences of positive emotions or situations, and patients will be challenged to use positive emotions to combat negative ones. Special emphasis will be placed on coping with negative emotions in conflict situations, and patients will process healthy conflict resolution skills.  Therapeutic Goals: 1. Patient will identify two positive emotions or experiences to reflect on in order to balance out negative emotions 2. Patient will label two or more emotions that they find the most difficult to experience 3. Patient will demonstrate positive conflict resolution skills through discussion and/or role plays  Summary of Patient Progress: Pt continues to work towards their tx goals but has not yet reached them. Pt was able to appropriately participate in group discussion, and was able to offer support/validation to other group members. Pt reported that two emotions he finds difficult to experience are love and compassion from others, "I don't believe they mean it." Pt reported that something he does to counteract negative emotions is, "talking myself up." Pt stated that sometimes he finds it difficult to open up to others due to the fear of being manipulated. Pt was able to explore possible alternative thoughts for this particular situation.    Therapeutic Modalities:   Cognitive Behavioral Therapy Feelings Identification Dialectical Behavioral Therapy   Heidi DachKelsey Damar Petit, MSW, LCSW 06/23/2017 10:17 AM

## 2017-06-23 NOTE — Plan of Care (Signed)
Patient new to unit programing . Introduction to how to use coping skills  and balance sleep wake cycle . Verbalized understanding of unit . Encourage to attend unit programing

## 2017-06-23 NOTE — Progress Notes (Signed)
D: Pt denies SI/HI/AVH. Pt is pleasant and cooperative. Pt stated he was feeling better , "I feel good"  A: Pt was offered support and encouragement. Pt was given scheduled medications. Pt was encourage to attend groups. Q 15 minute checks were done for safety.   R:Pt attends groups and interacts well with peers and staff. Pt is taking medication. Pt has no complaints.Pt receptive to treatment and safety maintained on unit.

## 2017-06-23 NOTE — Progress Notes (Signed)
D: Affect flat on approach . Patient requested vistaril this am with his standing  Medication . Stated when he gets up in the am  He is very anxious .  noted to interact with his peers during  Shift .  Patient stated slept good last night .Stated appetite fair and energy level low. Stated concentration is good . Stated on Depression scale 5, hopeless 5 and anxiety 10 .( low 0-10 high) Denies suicidal  homicidal ideations  .  No auditory hallucinations  No pain concerns . Appropriate ADL'S. Interacting with peers and staff.  A: Encourage patient participation with unit programming . Instruction  Given on  Medication , verbalize understanding. R: Voice no other concerns. Staff continue to monitor

## 2017-06-23 NOTE — Plan of Care (Signed)
Pt safe on the unit pt slept over 6 hrs last night

## 2017-06-23 NOTE — Tx Team (Signed)
Interdisciplinary Treatment and Diagnostic Plan Update  06/23/2017 Time of Session: 1:00pm Marc [name] MRN: [id number]  Principal Diagnosis: MDD (major depressive disorder)  Secondary Diagnoses: Principal Problem:   MDD (major depressive disorder)   Current Medications:  Current Facility-Administered Medications  Medication Dose Route Frequency Provider Last Rate Last Dose  . acetaminophen (TYLENOL) tablet 650 mg  650 mg Oral Q6H PRN McNew, Ileene HutchinsonHolly R, MD      . alum & mag hydroxide-simeth (MAALOX/MYLANTA) 200-200-20 MG/5ML suspension 30 mL  30 mL Oral Q4H PRN McNew, Holly R, MD      . gabapentin (NEURONTIN) tablet 600 mg  600 mg Oral TID Haskell RilingMcNew, Holly R, MD   600 mg at 06/23/17 0801  . hydrOXYzine (ATARAX/VISTARIL) tablet 50 mg  50 mg Oral Q6H PRN Haskell RilingMcNew, Holly R, MD   50 mg at 06/23/17 0801  . magnesium hydroxide (MILK OF MAGNESIA) suspension 30 mL  30 mL Oral Daily PRN McNew, Holly R, MD      . PARoxetine (PAXIL) tablet 10 mg  10 mg Oral QHS McNew, Ileene HutchinsonHolly R, MD   10 mg at 06/22/17 2136  . traZODone (DESYREL) tablet 100 mg  100 mg Oral QHS PRN McNew, Ileene HutchinsonHolly R, MD       PTA Medications: Medications Prior to Admission  Medication Sig Dispense Refill Last Dose  . HYDROcodone-acetaminophen (NORCO) 10-325 MG tablet Take 1 tablet by mouth every 6 (six) hours as needed for moderate pain.   Past Week at Unknown time  . ibuprofen (ADVIL,MOTRIN) 200 MG tablet Take 400-800 mg by mouth every 6 (six) hours as needed for headache or moderate pain.   Past Week at Unknown time    Patient Stressors: Financial difficulties Medication change or noncompliance Occupational concerns Substance abuse  Patient Strengths: Average or above average intelligence Capable of independent living Communication skills Motivation for treatment/growth Supportive family/friends Work skills  Treatment Modalities: Medication Management, Group therapy, Case management,  1 to 1 session with clinician,  Psychoeducation, Recreational therapy.   Physician Treatment Plan for Primary Diagnosis: MDD (major depressive disorder) Long Term Goal(s): Improvement in symptoms so as ready for discharge Improvement in symptoms so as ready for discharge   Short Term Goals: Ability to verbalize feelings will improve Ability to disclose and discuss suicidal ideas Ability to identify changes in lifestyle to reduce recurrence of condition will improve Ability to verbalize feelings will improve Ability to disclose and discuss suicidal ideas Ability to demonstrate self-control will improve Ability to identify and develop effective coping behaviors will improve Ability to maintain clinical measurements within normal limits will improve Ability to identify triggers associated with substance abuse/mental health issues will improve  Medication Management: Evaluate patient's response, side effects, and tolerance of medication regimen.  Therapeutic Interventions: 1 to 1 sessions, Unit Group sessions and Medication administration.  Evaluation of Outcomes: Progressing  Physician Treatment Plan for Secondary Diagnosis: Principal Problem:   MDD (major depressive disorder)  Long Term Goal(s): Improvement in symptoms so as ready for discharge Improvement in symptoms so as ready for discharge   Short Term Goals: Ability to verbalize feelings will improve Ability to disclose and discuss suicidal ideas Ability to identify changes in lifestyle to reduce recurrence of condition will improve Ability to verbalize feelings will improve Ability to disclose and discuss suicidal ideas Ability to demonstrate self-control will improve Ability to identify and develop effective coping behaviors will improve Ability to maintain clinical measurements within normal limits will improve Ability to identify triggers associated with substance  abuse/mental health issues will improve     Medication Management: Evaluate patient's  response, side effects, and tolerance of medication regimen.  Therapeutic Interventions: 1 to 1 sessions, Unit Group sessions and Medication administration.  Evaluation of Outcomes: Progressing   RN Treatment Plan for Primary Diagnosis: MDD (major depressive disorder) Long Term Goal(s): Knowledge of disease and therapeutic regimen to maintain health will improve  Short Term Goals: Ability to remain free from injury will improve and Ability to verbalize frustration and anger appropriately will improve  Medication Management: RN will administer medications as ordered by provider, will assess and evaluate patient's response and provide education to patient for prescribed medication. RN will report any adverse and/or side effects to prescribing provider.  Therapeutic Interventions: 1 on 1 counseling sessions, Psychoeducation, Medication administration, Evaluate responses to treatment, Monitor vital signs and CBGs as ordered, Perform/monitor CIWA, COWS, AIMS and Fall Risk screenings as ordered, Perform wound care treatments as ordered.  Evaluation of Outcomes: Progressing   LCSW Treatment Plan for Primary Diagnosis: MDD (major depressive disorder) Long Term Goal(s): Safe transition to appropriate next level of care at discharge, Engage patient in therapeutic group addressing interpersonal concerns.  Short Term Goals: Engage patient in aftercare planning with referrals and resources, Identify triggers associated with mental health/substance abuse issues and Increase skills for wellness and recovery  Therapeutic Interventions: Assess for all discharge needs, 1 to 1 time with Social worker, Explore available resources and support systems, Assess for adequacy in community support network, Educate family and significant other(s) on suicide prevention, Complete Psychosocial Assessment, Interpersonal group therapy.  Evaluation of Outcomes: Progressing   Progress in Treatment: Attending groups:  Yes. Participating in groups: Yes. Taking medication as prescribed: Yes. Toleration medication: Yes. Family/Significant other contact made: No, will contact:    Patient understands diagnosis: Yes. Discussing patient identified problems/goals with staff: Yes. Medical problems stabilized or resolved: Yes. Denies suicidal/homicidal ideation: Yes. Issues/concerns per patient self-inventory: Yes. Other:    New problem(s) identified: No, Describe:     New Short Term/Long Term Goal(s):  Discharge Plan or Barriers: Discharge home [facility]  Reason for Continuation of Hospitalization: Depression Medication stabilization  Estimated Length of Stay:3-5 days  Recreational Therapy: Patient Stressors: Family, Relationship, Friends, Work, Equities traderLiving Situation, Being alone. Patient Goal: Patient will attend and participate in Recreation Therapy Group Sessions x5 days.   Attendees: Patient:Leavy Jarold MottoPatterson 06/23/2017 2:20 PM  Physician: Corinna GabHolly McNew, MD 06/23/2017 2:20 PM  Nursing: Hulan AmatoGwen Farrish, RN 06/23/2017 2:20 PM  RN Care Manager: 06/23/2017 2:20 PM  Social Worker: Jake SharkSara Laws, LCSW 06/23/2017 2:20 PM  Recreational Therapist: Garret ReddishShay Scotlyn Mccranie, LRT 06/23/2017 2:20 PM  Other:  06/23/2017 2:20 PM  Other:  06/23/2017 2:20 PM  Other: 06/23/2017 2:20 PM    Scribe for Treatment Team: Glennon MacSara P Laws, LCSW 06/23/2017 2:20 PM

## 2017-06-23 NOTE — Progress Notes (Signed)
Recreation Therapy Notes  Date: 12.05.2018  Time: 1:00pm   Location: Craft Room  Behavioral response: Appropriate  Intervention Topic: Communication  Discussion/Intervention: Group content today was focused on communication. The group defined communication and ways to communicate with others. Individuals stated reason why communication is important and some reasons to communicate with others. Patients expressed if they thought they were good at communicating with others and ways they could improve their communication skills. The group identified important parts of communication and some experiences they have had in the past with communication. The group participated in the intervention "Put the story together", where they had a chance to test out their communication skills and identify ways to improve their communication techniques.  Clinical Observations/Feedback:  Patient came to group and was focused on what his peers and staff had to say about communication. He participated in the intervention and was social with his peers and staff during group.  Brazen Domangue LRT/CTRS         Nailani Full 06/23/2017 1:59 PM

## 2017-06-24 MED ORDER — HYDROXYZINE HCL 50 MG PO TABS: 50.0000 mg | ORAL_TABLET | Freq: Four times a day (QID) | ORAL | 0 refills | Status: DC | PRN

## 2017-06-24 MED ORDER — GABAPENTIN 600 MG PO TABS: 600.0000 mg | ORAL_TABLET | Freq: Three times a day (TID) | ORAL | 0 refills | Status: DC

## 2017-06-24 MED ORDER — NICOTINE 21 MG/24HR TD PT24
21.0000 mg | MEDICATED_PATCH | Freq: Every day | TRANSDERMAL | Status: DC
  Administered 2017-06-24 – 2017-06-25 (×2): 21 mg via TRANSDERMAL
  Filled 2017-06-24 (×2): qty 1

## 2017-06-24 MED ORDER — PAROXETINE HCL 10 MG PO TABS: 10.0000 mg | ORAL_TABLET | Freq: Every day | ORAL | 0 refills | Status: DC

## 2017-06-24 MED ORDER — ONDANSETRON HCL 4 MG PO TABS
4.0000 mg | ORAL_TABLET | Freq: Once | ORAL | Status: DC
  Filled 2017-06-24: qty 1

## 2017-06-24 NOTE — Progress Notes (Signed)
Recreation Therapy Notes  Date: 12.06.2018  Time: 1:00pm  Location: Craft Room  Behavioral response: Appropriate  Intervention Topic: Teamwork  Discussion/Intervention: Group content on today was focused on teamwork. The group identified what teamwork is. Individuals described who is a part of their team. Patients expressed why they thought teamwork is important. The group stated reasons why they thought it was easier to work with a Comptrollersmaller/larger team. Individuals discussed some positives and negatives of working with a team. Patients gave examples of past experiences they had while working with a team. The group participated in the intervention "Save the [place]alls", patients were in groups and had to keep the balls on the surface given.  Clinical Observations/Feedback:  Patient came to group and defined team work as working together. He stated that communication is the key to team work. Individual left group but later returned. Patient participated in the intervention and was social with his peers and staff.  [name] LRT/CTRS         [name] 06/24/2017 1:44 PM

## 2017-06-24 NOTE — Progress Notes (Signed)
Recreation Therapy Notes   Date: 12.06.2018  Time: 3:00pm  Location: Craft room  Behavioral response: Appropriate,Restless  Group Type: Art/Craft  Participation level: Active  Communication: Patient was social with peers and staff.  Comments: Patient was in and out of group several times due to unknown reasons.   Izadora Roehr LRT/CTRS        Trasean Delima 06/24/2017 4:36 PM

## 2017-06-24 NOTE — [facility] Group Notes (Signed)
  06/24/2017 9am  Type of Therapy and Topic: Group Therapy: Goals Group: SMART Goals   Participation Level: Active  Description of Group:    The purpose of a daily goals group is to assist and guide patients in setting recovery/wellness-related goals. The objective is to set goals as they relate to the crisis in which they were admitted. Patients will be using SMART goal modalities to set measurable goals. Characteristics of realistic goals will be discussed and patients will be assisted in setting and processing how one will reach their goal. Facilitator will also assist patients in applying interventions and coping skills learned in psycho-education groups to the SMART goal and process how one will achieve defined goal.   Therapeutic Goals:   -Patients will develop and document one goal related to or their crisis in which brought them into treatment.  -Patients will be guided by LCSW using SMART goal setting modality in how to set a measurable, attainable, realistic and time sensitive goal.  -Patients will process barriers in reaching goal.  -Patients will process interventions in how to overcome and successful in reaching goal.   Patient's Goal: "To cheer others up"  Therapeutic Modalities:  Motivational Interviewing  Cognitive Behavioral Therapy  Crisis Intervention Model  SMART goals setting  Johny ShearsCassandra  Sheva Mcdougle, LCSW 06/24/2017 10:41 AM

## 2017-06-24 NOTE — Progress Notes (Signed)
[facility]  06/24/2017 10:22 AM [name] A Vides  MRN:  [id number]   Subjective:  Pt states that he continues to have improvement in mood and anxiety. He has been attending all groups and getting a lot out of them. HE denies SI or thoughts of self harm today. He states that trazodone gives him nightmares. He slept well without sleeping medications. He has been calm on the unit. HE is tolerating medications well.   Principal Problem: Major depressive disorder, recurrent severe without psychotic features (HCC) Diagnosis:   Patient Active Problem List   Diagnosis Date Noted  . MDD (major depressive disorder) [F32.9] 06/22/2017  . Major depressive disorder, recurrent severe without psychotic features (HCC) [F33.2] 06/21/2017  . Severe episode of recurrent major depressive disorder, without psychotic features (HCC) [F33.2]   . Bipolar disorder, unspecified (HCC) [F31.9] 03/23/2017  . Depression, major, recurrent, in partial remission (HCC) [F33.41] 06/03/2011  . ELBOW SPRAIN, LEFT [[id number]] 09/17/2010   Total Time spent with patient: 20 minutes  Past Psychiatric History: See H&P  Past Medical History:  Past Medical History:  Diagnosis Date  . Anxiety   . Asthma   . Depressed   . Polysubstance abuse (HCC)   . Seizures (HCC)   . Wears glasses     Past Surgical History:  Procedure Laterality Date  . COLON SURGERY    . FRACTURE SURGERY     Family History:  Family History  Problem Relation Age of Onset  . Anxiety disorder Father   . Depression Father   . Anxiety disorder Paternal Grandfather   . Anxiety disorder Mother   . Depression Mother   . Depression Cousin   . Drug abuse Cousin    Family Psychiatric  History: See H&P Social History:  Social History   Substance and Sexual Activity  Alcohol Use Yes   Comment: very rarely per pt     Social History   Substance and Sexual Activity  Drug Use Yes  . Types: Marijuana   Comment: occ use 06/19/17    Social  History   Socioeconomic History  . Marital status: Single    Spouse name: None  . Number of children: None  . Years of education: None  . Highest education level: None  Social Needs  . Financial resource strain: None  . Food insecurity - worry: None  . Food insecurity - inability: None  . Transportation needs - medical: None  . Transportation needs - non-medical: None  Occupational History  . None  Tobacco Use  . Smoking status: Current Every Day Smoker    Packs/day: 2.00    Types: Cigarettes  . Smokeless tobacco: Never Used  Substance and Sexual Activity  . Alcohol use: Yes    Comment: very rarely per pt  . Drug use: Yes    Types: Marijuana    Comment: occ use 06/19/17  . Sexual activity: No  Other Topics Concern  . None  Social History Narrative  . None   Additional Social History:                         Sleep: Good  Appetite:  Good  Current Medications: Current Facility-Administered Medications  Medication Dose Route Frequency Provider Last Rate Last Dose  . acetaminophen (TYLENOL) tablet 650 mg  650 mg Oral Q6H PRN Haskell RilingMcNew, Jonda Alanis R, MD   650 mg at 06/23/17 1628  . alum & mag hydroxide-simeth (MAALOX/MYLANTA) 200-200-20 MG/5ML suspension 30 mL  30 mL Oral Q4H PRN Chassie Pennix, Ileene HutchinsonHolly R, MD      . gabapentin (NEURONTIN) tablet 600 mg  600 mg Oral TID Haskell RilingMcNew, Serin Thornell R, MD   600 mg at 06/24/17 0826  . hydrOXYzine (ATARAX/VISTARIL) tablet 50 mg  50 mg Oral Q6H PRN Haskell RilingMcNew, Cadi Rhinehart R, MD   50 mg at 06/24/17 0826  . magnesium hydroxide (MILK OF MAGNESIA) suspension 30 mL  30 mL Oral Daily PRN Kaydan Wong R, MD      . PARoxetine (PAXIL) tablet 10 mg  10 mg Oral QHS Kikuye Korenek, Ileene HutchinsonHolly R, MD   10 mg at 06/23/17 2209    Lab Results: No results found for this or any previous visit (from the past 48 hour(s)).  Blood Alcohol level:  Lab Results  Component Value Date   ETH <10 06/19/2017   ETH <5 03/21/2017    Metabolic Disorder Labs: Lab Results  Component Value Date    HGBA1C 5.6 04/28/2011   MPG 114 04/28/2011   No results found for: PROLACTIN Lab Results  Component Value Date   CHOL 140 04/28/2011   TRIG 55 04/28/2011   HDL 48 04/28/2011   CHOLHDL 2.9 04/28/2011   VLDL 11 04/28/2011   LDLCALC 81 04/28/2011    Physical Findings: AIMS:  , ,  ,  ,    CIWA:  CIWA-Ar Total: 2 COWS:  COWS Total Score: 2  Musculoskeletal: Strength & Muscle Tone: within normal limits Gait & Station: normal Patient leans: N/A  Psychiatric Specialty Exam: Physical Exam  ROS  Blood pressure 116/79, pulse 91, temperature 98.1 F (36.7 C), temperature source Oral, resp. rate 16, height 5\' 5"  (1.651 m), weight 54.9 kg (121 lb), SpO2 100 %.Body mass index is 20.14 kg/m.  General Appearance: Casual  Eye Contact:  Good  Speech:  Clear and Coherent  Volume:  Normal  Mood:  Depressed  Affect:  Congruent  Thought Process:  Goal Directed  Orientation:  Full (Time, Place, and Person)  Thought Content:  Negative  Suicidal Thoughts:  No  Homicidal Thoughts:  No  Memory:  Immediate;   Fair  Judgement:  Fair  Insight:  Fair  Psychomotor Activity:  Normal  Concentration:  Concentration: Fair  Recall:  FiservFair  Fund of Knowledge:  Fair  Language:  Fair  Akathisia:  No      Assets:  Communication Skills Desire for Improvement Resilience  ADL's:  Intact  Cognition:  WNL  Sleep:  Number of Hours: 7.15     Treatment Plan Summary: 21 yo male admitted due to SI. Mood and anxiety continue to improve. He is tolerating medications well.   Plan:  MDD/Anxiety -Continue Paxil 10 mg daily -Continue Gabapentin 600 mg TID -Prn Vistaril  Dispo -Pt will return to grandmothers house on discharge. [place]. [facility]   Haskell RilingHolly R Kaili Castille, MD 06/24/2017, 10:22 AM

## 2017-06-24 NOTE — [facility] Group Notes (Signed)
[facility] Group Notes:  (Nursing/MHT/Case Management/Adjunct)  Date:  06/24/2017  Time:  11:30 PM  Type of Therapy:  Evening Wrap-up Group  Participation Level:  Did Not Attend   Summary of Progress/Problems:  Tomasita MorrowChelsea Nanta Arisa Congleton 06/24/2017, 11:30 PM

## 2017-06-24 NOTE — Progress Notes (Signed)
D:  Pt calm and cooperative. Denies SI/HI and AVH.    A:  Pt offered emotional support.  Scheduled and PRN medications administered as ordered. Q 15 minute checks in place for safety.   R:  Pt compliant with mediations. Positive interaction with peers. Active participation in groups attended.

## 2017-06-24 NOTE — [facility] Group Notes (Signed)
[facility] LCSW Group Therapy Note  Date/Time: 06/24/17, 0930  Type of Therapy/Topic:  Group Therapy:  Balance in Life  Participation Level:  active  Description of Group:    This group will address the concept of balance and how it feels and looks when one is unbalanced. Patients will be encouraged to process areas in their lives that are out of balance, and identify reasons for remaining unbalanced. Facilitators will guide patients utilizing problem- solving interventions to address and correct the stressor making their life unbalanced. Understanding and applying boundaries will be explored and addressed for obtaining  and maintaining a balanced life. Patients will be encouraged to explore ways to assertively make their unbalanced needs known to significant others in their lives, using other group members and facilitator for support and feedback.  Therapeutic Goals: 1. Patient will identify two or more emotions or situations they have that consume much of in their lives. 2. Patient will identify signs/triggers that life has become out of balance:  3. Patient will identify two ways to set boundaries in order to achieve balance in their lives:  4. Patient will demonstrate ability to communicate their needs through discussion and/or role plays  Summary of Patient Progress: Pt shared that work, mental, and physical are out of balance for him.   Pt took part in discussion regarding ways to take steps in a better direction one at a time when life feels out of balance and stressful.  Pt left a few minutes early, good participation before that.           Therapeutic Modalities:   Cognitive Behavioral Therapy Solution-Focused Therapy Assertiveness Training  Daleen SquibbGreg Tatyanna Cronk, [place]

## 2017-06-24 NOTE — [facility] Group Notes (Signed)
[facility] Group Notes:  (Nursing/MHT/Case Management/Adjunct)  Date:  06/24/2017  Time:  3:05 PM  Type of Therapy:  Psychoeducational Skills  Participation Level:  Active  Participation Quality:  Appropriate and Attentive  Affect:  Appropriate  Cognitive:  Appropriate and Oriented  Insight:  Appropriate and Good  Engagement in Group:  Engaged and Supportive  Modes of Intervention:  Discussion and Education  Summary of Progress/Problems:  Marc Farberamela M Moses [name] 06/24/2017, 3:05 PM

## 2017-06-25 NOTE — [facility] Suicide Risk Assessment (Signed)
[facility]   Principal Problem: Major depressive disorder, recurrent severe without psychotic features [facility](HCC) Discharge Diagnoses:  Patient Active Problem List   Diagnosis Date Noted  . MDD (major depressive disorder) [F32.9] 06/22/2017  . Major depressive disorder, recurrent severe without psychotic features (HCC) [F33.2] 06/21/2017  . Severe episode of recurrent major depressive disorder, without psychotic features (HCC) [F33.2]   . Bipolar disorder, unspecified (HCC) [F31.9] 03/23/2017  . Depression, major, recurrent, in partial remission (HCC) [F33.41] 06/03/2011  . ELBOW SPRAIN, LEFT [[id number]] 09/17/2010    Total Time spent with patient: 20 minutes    Mental Status Per Nursing Assessment::   On Admission:     Demographic Factors:  Male and Caucasian  Loss Factors: NA  Historical Factors: impulsivity  Risk Reduction Factors:   Living with another person, especially a relative, Positive social support, Positive therapeutic relationship and Positive coping skills or problem solving skills  Continued Clinical Symptoms:  Depression:   Impulsivity  Cognitive Features That Contribute To Risk:  None    Suicide Risk:  Minimal: No identifiable suicidal ideation.  Patients presenting with no risk factors but with morbid ruminations; may be classified as minimal risk based on the severity of the depressive symptoms  Follow-up Information    [facility].   Why:  Therapist, Blase MessBrandon James: Wednesday, 07/07/17 at 3PM  Doctor: Monday, 09/13/17 at 9AM. (First available. You are also on the cancellation list).  Contact information: [address] [place] [address] [phone]           Plan Of Care/Follow-up recommendations:  See above  Haskell RilingHolly R Garry Bochicchio, MD 06/25/2017, 9:10 AM

## 2017-06-25 NOTE — Progress Notes (Signed)
  Marc [name] :  Will you be returning to the same living situation after discharge:  Yes,  Pt will be living with his grandparents. At discharge, do you have transportation home?: Yes,  Pt's mother will be picking him up. Do you have the ability to pay for your medications: Yes,  Pt has financial assistance from his family.  Release of information consent forms completed and in the chart;  Patient's signature needed at discharge.  Patient to Follow up at: Follow-up Information    [facility] Go [facility].   Why:  Therapist, Blase MessBrandon [name]: Wednesday, 07/07/17 at 3PM  Doctor: Monday, 09/13/17 at 9AM. (First available. You are also on the cancellation list).  Contact information: [address] [place] [address] [phone]           Next level of care provider has access to Marc [name]:no  Safety Planning and Suicide Prevention discussed: Yes,  Marc JunesBrandon denies any weapons or safety concerns in the homej.  Have you used any form of tobacco in the last 30 days? (Cigarettes, Smokeless Tobacco, Cigars, and/or Pipes): Yes  Has patient been referred to the Quitline?: Patient refused referral  Patient has been referred for addiction treatment: N/A  Marc FrameSonya S Saafir Abdullah, LCSW 06/25/2017, 8:59 AM

## 2017-06-25 NOTE — Plan of Care (Signed)
Patient has been pleasant and cooperative. Engaged in activities and denying thoughts of self harm

## 2017-06-25 NOTE — Discharge Summary (Signed)
Physician Discharge Summary Note  Patient:  Marc [name] is an 21 y.o., male MRN:  [id number] DOB:  March 09, 1996 Patient phone:  [phone] (home)  Patient address:   [address] [place] [id number],  Total Time spent with patient: 20 minutes  Date of Admission:  06/22/2017 Date of Discharge: 06/25/17  Reason for Admission:  SI  Principal Problem: Major depressive disorder, recurrent severe without psychotic features [facility](HCC) Discharge Diagnoses: Patient Active Problem List   Diagnosis Date Noted  . MDD (major depressive disorder) [F32.9] 06/22/2017  . Major depressive disorder, recurrent severe without psychotic features (HCC) [F33.2] 06/21/2017  . Severe episode of recurrent major depressive disorder, without psychotic features (HCC) [F33.2]   . Bipolar disorder, unspecified (HCC) [F31.9] 03/23/2017  . Depression, major, recurrent, in partial remission (HCC) [F33.41] 06/03/2011  . ELBOW SPRAIN, LEFT [[id number]] 09/17/2010    Past Psychiatric History: See H&P  Past Medical History:  Past Medical History:  Diagnosis Date  . Anxiety   . Asthma   . Depressed   . Polysubstance abuse (HCC)   . Seizures (HCC)   . Wears glasses     Past Surgical History:  Procedure Laterality Date  . COLON SURGERY    . FRACTURE SURGERY     Family History:  Family History  Problem Relation Age of Onset  . Anxiety disorder Father   . Depression Father   . Anxiety disorder Paternal Grandfather   . Anxiety disorder Mother   . Depression Mother   . Depression Cousin   . Drug abuse Cousin    Family Psychiatric  History: See H&P Social History:  Social History   Substance and Sexual Activity  Alcohol Use Yes   Comment: very rarely per pt     Social History   Substance and Sexual Activity  Drug Use Yes  . Types: Marijuana   Comment: occ use 06/19/17    Social History   Socioeconomic History  . Marital status: Single    Spouse name: None  . Number of children: None  .  Years of education: None  . Highest education level: None  Social Needs  . Financial resource strain: None  . Food insecurity - worry: None  . Food insecurity - inability: None  . Transportation needs - medical: None  . Transportation needs - non-medical: None  Occupational History  . None  Tobacco Use  . Smoking status: Current Every Day Smoker    Packs/day: 2.00    Types: Cigarettes  . Smokeless tobacco: Never Used  Substance and Sexual Activity  . Alcohol use: Yes    Comment: very rarely per pt  . Drug use: Yes    Types: Marijuana    Comment: occ use 06/19/17  . Sexual activity: No  Other Topics Concern  . None  Social History Narrative  . None    Hospital Course:  Pt was started on Paxil 10 mg daily for anxiety and depression. Gabapentin was increased to 600 mg TID to help target anxiety. PT tolerated all medications well with no side effects. On day of discharge, pt was bright and cheerful. He denied SI or any thoughts of self harm. HE is very future oriented and looking forward to getting his GED and becoming a peer support. He has been active and participating in the milieu. He has been attending group and learning coping skills. Pt felt safe and ready to discharge. He was given 7 day supply of medications from our pharmacy.   The patient  is at low risk of imminent suicide. Patient denied thoughts, intent, or plan for harm to self or others, expressed significant future orientation, and expressed an ability to mobilize assistance for his/her needs. She is presently void of any contributing psychiatric symptoms, cognitive difficulties, or substance use which would elevate his risk for lethality. Chronic risk for lethality is elevated in light of stress at home.The chronic risk is presently mitigated by his/her ongoing [facility] and [facility] and mobilization of support from family and friends. Chronic risk may elevate if he experiences any significant loss or  worsening of symptoms, which can be managed and monitored through outpatient providers. At this time,a cute risk for lethality is low and he is stable for ongoing outpatient management.   Modifiable risk factors were addressed during this hospitalization through appropriate pharmacotherapy and establishment of outpatient follow-up treatment. Some risk factors for suicide are situational (i.e. Unstable housing) or related personality pathology (i.e. Poor coping mechanisms) and thus cannot be further mitigated by continued hospitalization in this setting.    Physical Findings: AIMS:  , ,  ,  ,    CIWA:  CIWA-Ar Total: 2 COWS:  COWS Total Score: 2  Musculoskeletal: Strength & Muscle Tone: within normal limits Gait & Station: normal Patient leans: N/A  Psychiatric Specialty Exam: Physical Exam  Nursing note and vitals reviewed.   Review of Systems  All other systems reviewed and are negative.   Blood pressure 110/72, pulse 66, temperature 98.3 F (36.8 C), temperature source Oral, resp. rate 16, height 5\' 5"  (1.651 m), weight 54.9 kg (121 lb), SpO2 100 %.Body mass index is 20.14 kg/m.  General Appearance: Casual  Eye Contact:  Good  Speech:  Clear and Coherent  Volume:  Normal  Mood:  Euthymic  Affect:  Congruent  Thought Process:  Goal Directed  Orientation:  Full (Time, Place, and Person)  Thought Content:  Negative  Suicidal Thoughts:  No  Homicidal Thoughts:  No  Memory:  Immediate;   Fair  Judgement:  Fair  Insight:  Fair  Psychomotor Activity:  Normal  Concentration:  Concentration: Fair  Recall:  Fair  Fund of Knowledge:  Fair  Language:  Fair  Akathisia:  No      Assets:  Communication Skills Desire for Improvement Resilience  ADL's:  Intact  Cognition:  WNL  Sleep:  Number of Hours: 5.25     Have you used any form of tobacco in the last 30 days? (Cigarettes, Smokeless Tobacco, Cigars, and/or Pipes): Yes  Has this patient used any form of tobacco in the  last 30 days? (Cigarettes, Smokeless Tobacco, Cigars, and/or Pipes) Yes, Yes, A prescription for an FDA-approved tobacco cessation medication was offered at discharge and the patient refused  Blood Alcohol level:  Lab Results  Component Value Date   [facility]ETH <10 06/19/2017   ETH <5 03/21/2017    Metabolic Disorder Labs:  Lab Results  Component Value Date   HGBA1C 5.6 04/28/2011   MPG 114 04/28/2011   No results found for: PROLACTIN Lab Results  Component Value Date   CHOL 140 04/28/2011   TRIG 55 04/28/2011   HDL 48 04/28/2011   CHOLHDL 2.9 04/28/2011   VLDL 11 04/28/2011   LDLCALC 81 04/28/2011    See Psychiatric Specialty Exam and Suicide Risk Assessment completed by Attending Physician prior to discharge.  Discharge destination:  Home  Is patient on multiple antipsychotic therapies at discharge:  No   Has Patient had three or more  failed trials of antipsychotic monotherapy by history:  No  Recommended Plan for Multiple Antipsychotic Therapies: NA  Discharge Instructions    Increase activity slowly   Complete by:  As directed      Allergies as of 06/25/2017      Reactions   Promethazine Hcl Other (See Comments)   Seizures      Medication List    STOP taking these medications   HYDROcodone-acetaminophen 10-325 MG tablet Commonly known as:  NORCO   ibuprofen 200 MG tablet Commonly known as:  ADVIL,MOTRIN     TAKE these medications     Indication  gabapentin 600 MG tablet Commonly known as:  NEURONTIN Take 1 tablet (600 mg total) by mouth 3 (three) times daily.  Indication:  anxiety   hydrOXYzine 50 MG tablet Commonly known as:  ATARAX/VISTARIL Take 1 tablet (50 mg total) by mouth every 6 (six) hours as needed for anxiety.  Indication:  Feeling Anxious   PARoxetine 10 MG tablet Commonly known as:  PAXIL Take 1 tablet (10 mg total) by mouth at bedtime.  Indication:  Generalized Anxiety Disorder, Major Depressive Disorder      Follow-up Information     [place], [facility]. Go on 07/04/2017.   Why:  Therapist, Blase Mess: Wednesday, 07/07/17 at 3PM  Doctor: Monday, 09/13/17 at 9AM. (First available. You are also on the cancellation list).  Contact information: [address] [place] [address] [phone]           Follow-up recommendations:  Follow up with [facility] Signed: Haskell Riling, MD 06/25/2017, 9:05 AM

## 2017-06-25 NOTE — Progress Notes (Signed)
Recreation Therapy Notes  INPATIENT RECREATION TR PLAN  Patient Details Name: Marc [name] MRN: [id number] DOB: 1996/07/16 Today's Date: 06/25/2017  Rec Therapy Plan Is patient appropriate for Therapeutic Recreation?: Yes Treatment times per week: at least3 Estimated Length of Stay: 5-7 days TR Treatment/Interventions: Group participation (Comment)(Appropriate participation in recreation therapy tx. )  Discharge Criteria Pt will be discharged from therapy if:: Discharged Treatment plan/goals/alternatives discussed and agreed upon by:: Patient/family  Discharge Summary Short term goals set: STG-The Patient will attend and participate in Recreation Therapy Group Sessions x5 days.  Short term goals met: Complete Reason goals not met: N/A Therapeutic equipment acquired: N/A Reason patient discharged from therapy: Discharge from hospital Pt/family agrees with progress & goals achieved: Yes Date patient discharged from therapy: 06/25/17   Cordell Coke 06/25/2017, 3:06 PM

## 2017-06-25 NOTE — Progress Notes (Signed)
Patient stayed in the milieu, pleasant and cooperative. Denying SI/HI. Denying hallucinations. Patient attended group and was supportive to peers. Had no concern throughout this shift. Currently in bed sleeping. No sign of discomfort noted. Safety precautions maintained.

## 2017-06-25 NOTE — Progress Notes (Signed)
Patient on the unit. He is pleasant and med compliant. He denies si, hi, avh. Denies pain. No behavior issues. Will continue to monitor. Safety maintained.

## 2017-08-20 ENCOUNTER — Encounter (HOSPITAL_COMMUNITY): Payer: Self-pay | Admitting: Emergency Medicine

## 2017-08-20 ENCOUNTER — Emergency Department (HOSPITAL_COMMUNITY)
Admission: EM | Admit: 2017-08-20 | Discharge: 2017-08-20 | Disposition: A | Discharge status: Home / Self Care | Payer: Self-pay | Attending: Emergency Medicine | Admitting: Emergency Medicine

## 2017-08-20 ENCOUNTER — Other Ambulatory Visit: Payer: Self-pay

## 2017-08-20 DIAGNOSIS — R112 Nausea with vomiting, unspecified: Secondary | ICD-10-CM | POA: Insufficient documentation

## 2017-08-20 DIAGNOSIS — R1010 Upper abdominal pain, unspecified: Secondary | ICD-10-CM

## 2017-08-20 DIAGNOSIS — R1013 Epigastric pain: Secondary | ICD-10-CM | POA: Insufficient documentation

## 2017-08-20 DIAGNOSIS — Z79899 Other long term (current) drug therapy: Secondary | ICD-10-CM | POA: Insufficient documentation

## 2017-08-20 DIAGNOSIS — J45909 Unspecified asthma, uncomplicated: Secondary | ICD-10-CM | POA: Insufficient documentation

## 2017-08-20 DIAGNOSIS — F17210 Nicotine dependence, cigarettes, uncomplicated: Secondary | ICD-10-CM | POA: Insufficient documentation

## 2017-08-20 DIAGNOSIS — R1030 Lower abdominal pain, unspecified: Secondary | ICD-10-CM | POA: Insufficient documentation

## 2017-08-20 LAB — URINALYSIS, ROUTINE W REFLEX MICROSCOPIC
Bacteria, UA: NONE SEEN
Bilirubin Urine: NEGATIVE
Glucose, UA: NEGATIVE mg/dL
Hgb urine dipstick: NEGATIVE
KETONES UR: 20 mg/dL — AB
Leukocytes, UA: NEGATIVE
Nitrite: NEGATIVE
PH: 7 (ref 5.0–8.0)
Protein, ur: 30 mg/dL — AB
SPECIFIC GRAVITY, URINE: 1.023 (ref 1.005–1.030)

## 2017-08-20 LAB — COMPREHENSIVE METABOLIC PANEL
ALBUMIN: 4.9 g/dL (ref 3.5–5.0)
ALT: 51 U/L (ref 17–63)
ANION GAP: 13 (ref 5–15)
AST: 38 U/L (ref 15–41)
Alkaline Phosphatase: 65 U/L (ref 38–126)
BUN: 18 mg/dL (ref 6–20)
CO2: 25 mmol/L (ref 22–32)
Calcium: 9.7 mg/dL (ref 8.9–10.3)
Chloride: 96 mmol/L — ABNORMAL LOW (ref 101–111)
Creatinine, Ser: 0.94 mg/dL (ref 0.61–1.24)
GFR calc Af Amer: >60 mL/min (ref >60)
GFR calc non Af Amer: >60 mL/min (ref >60)
GLUCOSE: 126 mg/dL — AB (ref 65–99)
Potassium: 3.5 mmol/L (ref 3.5–5.1)
SODIUM: 134 mmol/L — AB (ref 135–145)
Total Bilirubin: 0.8 mg/dL (ref 0.3–1.2)
Total Protein: 8.3 g/dL — ABNORMAL HIGH (ref 6.5–8.1)

## 2017-08-20 LAB — LIPASE, BLOOD: Lipase: 25 U/L (ref 11–51)

## 2017-08-20 LAB — CBC WITH DIFFERENTIAL/PLATELET
BASOS PCT: 0 %
Basophils Absolute: 0 10*3/uL (ref 0.0–0.1)
EOS ABS: 0.2 10*3/uL (ref 0.0–0.7)
Eosinophils Relative: 1 %
HCT: 46.9 % (ref 39.0–52.0)
HEMOGLOBIN: 16.4 g/dL (ref 13.0–17.0)
Lymphocytes Relative: 21 %
Lymphs Abs: 3.1 10*3/uL (ref 0.7–4.0)
MCH: 31.6 pg (ref 26.0–34.0)
MCHC: 35 g/dL (ref 30.0–36.0)
MCV: 90.4 fL (ref 78.0–100.0)
MONO ABS: 0.7 10*3/uL (ref 0.1–1.0)
MONOS PCT: 5 %
NEUTROS PCT: 73 %
Neutro Abs: 10.9 10*3/uL — ABNORMAL HIGH (ref 1.7–7.7)
Platelets: 224 10*3/uL (ref 150–400)
RBC: 5.19 MIL/uL (ref 4.22–5.81)
RDW: 12.3 % (ref 11.5–15.5)
WBC: 14.9 10*3/uL — ABNORMAL HIGH (ref 4.0–10.5)

## 2017-08-20 LAB — RAPID URINE DRUG SCREEN, HOSP PERFORMED
Amphetamines: NOT DETECTED
BARBITURATES: NOT DETECTED
BENZODIAZEPINES: NOT DETECTED
COCAINE: NOT DETECTED
Opiates: POSITIVE — AB
Tetrahydrocannabinol: POSITIVE — AB

## 2017-08-20 MED ORDER — ONDANSETRON HCL 8 MG PO TABS: 8.0000 mg | ORAL_TABLET | Freq: Three times a day (TID) | ORAL | 0 refills | Status: DC | PRN

## 2017-08-20 MED ORDER — HALOPERIDOL LACTATE 5 MG/ML IJ SOLN
2.0000 mg | Freq: Once | INTRAMUSCULAR | Status: AC
  Administered 2017-08-20: 2 mg via INTRAVENOUS
  Filled 2017-08-20: qty 1

## 2017-08-20 MED ORDER — SODIUM CHLORIDE 0.9 % IV BOLUS (SEPSIS)
1000.0000 mL | Freq: Once | INTRAVENOUS | Status: AC
  Administered 2017-08-20: 1000 mL via INTRAVENOUS

## 2017-08-20 MED ORDER — ONDANSETRON HCL 4 MG/2ML IJ SOLN
4.0000 mg | Freq: Once | INTRAMUSCULAR | Status: AC
  Administered 2017-08-20: 4 mg via INTRAVENOUS
  Filled 2017-08-20: qty 2

## 2017-08-20 NOTE — Discharge Instructions (Signed)
Start with a clear liquid only diet today.  Gradually advance to bland foods after a day or 2.  For pain use Tylenol.  Return here, if needed, for problems.

## 2017-08-20 NOTE — ED Triage Notes (Signed)
Sudden abd pain and vomiting since 4pm.  Pt states was going to come to ed a few days ago b/c of black dirrhea starting since Tuesday. A/o. vomiting upon entrance.

## 2017-08-20 NOTE — ED Provider Notes (Signed)
[facility] EMERGENCY DEPARTMENT Provider Note   CSN: [id number] Arrival date & time: 08/20/17  1753     History   Chief Complaint Chief Complaint  Patient presents with  . Abdominal Pain  . Emesis    HPI Marc [name] is a 22 y.o. male.  He presents for evaluation of abdominal pain nausea and vomiting which started at 3:30 PM today.  Vomiting started prior to abdominal pain.  He denies diarrhea.  He denies fever, chills, shortness of breath or cough.  No other recent illnesses.  No known sick contacts.  There are no other known modifying factors.  HPI  Past Medical History:  Diagnosis Date  . Anxiety   . Asthma   . Depressed   . Polysubstance abuse (HCC)   . Seizures (HCC)   . Wears glasses     Patient Active Problem List   Diagnosis Date Noted  . MDD (major depressive disorder) 06/22/2017  . 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 Severe episode of recurrent major depressive disorder, without psychotic features (HCC)   . Bipolar disorder, unspecified (HCC) 03/23/2017  . Depression, major, recurrent, in partial remission (HCC) 06/03/2011  . ELBOW SPRAIN, LEFT 09/17/2010    Past Surgical History:  Procedure Laterality Date  . COLON SURGERY    . FRACTURE SURGERY         Home Medications    Prior to Admission medications   Medication Sig Start Date End Date Taking? Authorizing Provider  gabapentin (NEURONTIN) 600 MG tablet Take 1 tablet (600 mg total) by mouth 3 (three) times daily. 06/24/17  Yes McNew, Ileene HutchinsonHolly R, MD  hydrOXYzine (ATARAX/VISTARIL) 50 MG tablet Take 1 tablet (50 mg total) by mouth every 6 (six) hours as needed for anxiety. 06/24/17  Yes McNew, Ileene HutchinsonHolly R, MD  PARoxetine (PAXIL) 10 MG tablet Take 1 tablet (10 mg total) by mouth at bedtime. 06/24/17  Yes McNew, Ileene HutchinsonHolly R, MD  ondansetron (ZOFRAN) 8 MG tablet Take 1 tablet (8 mg total) by mouth every 8 (eight) hours as needed for nausea or vomiting.  08/20/17   Mancel BaleWentz, Lillar Bianca, MD    Family History Family History  Problem Relation Age of Onset  . Anxiety disorder Father   . Depression Father   . Anxiety disorder Paternal Grandfather   . Anxiety disorder Mother   . Depression Mother   . Depression Cousin   . Drug abuse Cousin     Social History Social History   Tobacco Use  . Smoking status: Current Every Day Smoker    Packs/day: 2.00    Types: Cigarettes  . Smokeless tobacco: Never Used  Substance Use Topics  . Alcohol use: Yes    Comment: very rarely per pt  . Drug use: Yes    Types: Marijuana    Comment: occ use 06/19/17     Allergies   Promethazine hcl   Review of Systems Review of Systems  All other systems reviewed and are negative.    Physical Exam Updated Vital Signs BP 133/81   Pulse 73   Temp 97.9 F (36.6 C)   Resp 18   Ht 5\' 5"  (1.651 m)   Wt 56.7 kg (125 lb)   SpO2 100%   BMI 20.80 kg/m   Physical Exam  Constitutional: He is oriented to person, place, and time. He appears well-developed and well-nourished.  HENT:  Head: Normocephalic and atraumatic.  Right Ear: External ear normal.  Left Ear: External ear  normal.  Eyes: Conjunctivae and EOM are normal. Pupils are equal, round, and reactive to light.  Neck: Normal range of motion and phonation normal. Neck supple.  Cardiovascular: Normal rate, regular rhythm and normal heart sounds.  Pulmonary/Chest: Effort normal and breath sounds normal. He exhibits no bony tenderness.  Abdominal: Soft. There is no tenderness. There is no rigidity and no guarding.  Suprapubic and epigastric tenderness, mild  Musculoskeletal: Normal range of motion.  Neurological: He is alert and oriented to person, place, and time. No cranial nerve deficit or sensory deficit. He exhibits normal muscle tone. Coordination normal.  Skin: Skin is warm, dry and intact.  Psychiatric: He has a normal mood and affect. His behavior is normal. Judgment and thought content  normal.  Nursing note and vitals reviewed.    ED Treatments / Results  Labs (all labs ordered are listed, but only abnormal results are displayed) Labs Reviewed  CBC WITH DIFFERENTIAL/PLATELET - Abnormal; Notable for the following components:      Result Value   WBC 14.9 (*)    Neutro Abs 10.9 (*)    All other components within normal limits  COMPREHENSIVE METABOLIC PANEL - Abnormal; Notable for the following components:   Sodium 134 (*)    Chloride 96 (*)    Glucose, Bld 126 (*)    Total Protein 8.3 (*)    All other components within normal limits  RAPID URINE DRUG SCREEN, HOSP PERFORMED - Abnormal; Notable for the following components:   Opiates POSITIVE (*)    Tetrahydrocannabinol POSITIVE (*)    All other components within normal limits  URINALYSIS, ROUTINE W REFLEX MICROSCOPIC - Abnormal; Notable for the following components:   Ketones, ur 20 (*)    Protein, ur 30 (*)    Squamous Epithelial / LPF 0-5 (*)    All other components within normal limits  LIPASE, BLOOD    EKG  EKG Interpretation None       Radiology No results found.  Procedures Procedures (including critical care time)  Medications Ordered in ED Medications  ondansetron (ZOFRAN) injection 4 mg (4 mg Intravenous Given 08/20/17 1804)  sodium chloride 0.9 % bolus 1,000 mL (0 mLs Intravenous Stopped 08/20/17 2010)  haloperidol lactate (HALDOL) injection 2 mg (2 mg Intravenous Given 08/20/17 1913)     Initial Impression / Assessment and Plan / ED Course  I have reviewed the triage vital signs and the nursing notes.  Pertinent labs & imaging results that were available during my care of the patient were reviewed by me and considered in my medical decision making (see chart for details).      Patient Vitals for the past 24 hrs:  BP Temp Pulse Resp SpO2 Height Weight  08/20/17 1900 133/81 - 73 18 100 % - -  08/20/17 1830 125/86 - 70 - 100 % - -  08/20/17 1800 125/88 - 78 - 100 % - -  08/20/17 1756  132/90 97.9 F (36.6 C) 65 18 100 % - -  08/20/17 1755 - - - - - 5\' 5"  (1.651 m) 56.7 kg (125 lb)    8:27 PM Reevaluation with update and discussion. After initial assessment and treatment, an updated evaluation reveals he states his pain has resolved and he is now tolerating water without vomiting.  Abdomen is soft and nontender to palpation at this time.  Findings discussed with patient and all questions answered.Mancel Bale      Final Clinical Impressions(s) / ED Diagnoses   Final  diagnoses:  Nausea and vomiting, intractability of vomiting not specified, unspecified vomiting type  Pain of upper abdomen   Nonspecific nausea, vomiting and abdominal pain.  Suspect enteritis versus food poisoning.  She is nontoxic with reassuring evaluation in the emergency department.  Nursing Notes Reviewed/ Care Coordinated Applicable Imaging Reviewed Interpretation of Laboratory Data incorporated into ED treatment  The patient appears reasonably screened and/or stabilized for discharge and I doubt any other medical condition or other [facility] requiring further screening, evaluation, or treatment in the ED at this time prior to discharge.  Plan: Home Medications-OTC analgesia; Home Treatments-gradually advance diet; return here if the recommended treatment, does not improve the symptoms; Recommended follow up-PCP or return here as needed   ED Discharge Orders        Ordered    ondansetron (ZOFRAN) 8 MG tablet  Every 8 hours PRN     08/20/17 2025       Mancel Bale, MD 08/20/17 2028

## 2017-09-13 ENCOUNTER — Emergency Department (HOSPITAL_COMMUNITY): Payer: Self-pay

## 2017-09-13 ENCOUNTER — Emergency Department (HOSPITAL_COMMUNITY)
Admission: EM | Admit: 2017-09-13 | Discharge: 2017-09-13 | Disposition: A | Discharge status: Home / Self Care | Payer: Self-pay | Attending: Emergency Medicine | Admitting: Emergency Medicine

## 2017-09-13 ENCOUNTER — Encounter (HOSPITAL_COMMUNITY): Payer: Self-pay | Admitting: Emergency Medicine

## 2017-09-13 ENCOUNTER — Other Ambulatory Visit: Payer: Self-pay

## 2017-09-13 DIAGNOSIS — F17210 Nicotine dependence, cigarettes, uncomplicated: Secondary | ICD-10-CM | POA: Insufficient documentation

## 2017-09-13 DIAGNOSIS — M549 Dorsalgia, unspecified: Secondary | ICD-10-CM

## 2017-09-13 DIAGNOSIS — M545 Low back pain: Secondary | ICD-10-CM | POA: Insufficient documentation

## 2017-09-13 DIAGNOSIS — J45909 Unspecified asthma, uncomplicated: Secondary | ICD-10-CM | POA: Insufficient documentation

## 2017-09-13 DIAGNOSIS — Z79899 Other long term (current) drug therapy: Secondary | ICD-10-CM | POA: Insufficient documentation

## 2017-09-13 DIAGNOSIS — R109 Unspecified abdominal pain: Secondary | ICD-10-CM | POA: Insufficient documentation

## 2017-09-13 LAB — CBC WITH DIFFERENTIAL/PLATELET
BASOS ABS: 0 10*3/uL (ref 0.0–0.1)
BASOS PCT: 1 %
Eosinophils Absolute: 0.3 10*3/uL (ref 0.0–0.7)
Eosinophils Relative: 4 %
HEMATOCRIT: 43.7 % (ref 39.0–52.0)
HEMOGLOBIN: 14.7 g/dL (ref 13.0–17.0)
Lymphocytes Relative: 38 %
Lymphs Abs: 2.7 10*3/uL (ref 0.7–4.0)
MCH: 31.1 pg (ref 26.0–34.0)
MCHC: 33.6 g/dL (ref 30.0–36.0)
MCV: 92.4 fL (ref 78.0–100.0)
MONOS PCT: 8 %
Monocytes Absolute: 0.6 10*3/uL (ref 0.1–1.0)
NEUTROS ABS: 3.5 10*3/uL (ref 1.7–7.7)
NEUTROS PCT: 49 %
Platelets: 215 10*3/uL (ref 150–400)
RBC: 4.73 MIL/uL (ref 4.22–5.81)
RDW: 12.5 % (ref 11.5–15.5)
WBC: 7.1 10*3/uL (ref 4.0–10.5)

## 2017-09-13 LAB — URINALYSIS, ROUTINE W REFLEX MICROSCOPIC
BILIRUBIN URINE: NEGATIVE
GLUCOSE, UA: NEGATIVE mg/dL
HGB URINE DIPSTICK: NEGATIVE
Ketones, ur: NEGATIVE mg/dL
Leukocytes, UA: NEGATIVE
Nitrite: NEGATIVE
PH: 5 (ref 5.0–8.0)
Protein, ur: NEGATIVE mg/dL
SPECIFIC GRAVITY, URINE: 1.031 — AB (ref 1.005–1.030)

## 2017-09-13 LAB — COMPREHENSIVE METABOLIC PANEL
ALBUMIN: 4.1 g/dL (ref 3.5–5.0)
ALK PHOS: 48 U/L (ref 38–126)
ALT: 34 U/L (ref 17–63)
ANION GAP: 9 (ref 5–15)
AST: 26 U/L (ref 15–41)
BUN: 17 mg/dL (ref 6–20)
CALCIUM: 9.3 mg/dL (ref 8.9–10.3)
CO2: 26 mmol/L (ref 22–32)
Chloride: 105 mmol/L (ref 101–111)
Creatinine, Ser: 0.87 mg/dL (ref 0.61–1.24)
GFR calc Af Amer: >60 mL/min (ref >60)
GFR calc non Af Amer: >60 mL/min (ref >60)
GLUCOSE: 103 mg/dL — AB (ref 65–99)
Potassium: 4.4 mmol/L (ref 3.5–5.1)
SODIUM: 140 mmol/L (ref 135–145)
TOTAL PROTEIN: 6.8 g/dL (ref 6.5–8.1)
Total Bilirubin: 0.2 mg/dL — ABNORMAL LOW (ref 0.3–1.2)

## 2017-09-13 MED ORDER — NAPROXEN 500 MG PO TABS: ORAL_TABLET | ORAL | 0 refills | Status: DC

## 2017-09-13 NOTE — ED Triage Notes (Addendum)
Pt c/o bilateral flank pain with nausea x 1 day. Pt also states he has a tender area underneath LT rib. Endorses urinary frequency.

## 2017-09-13 NOTE — Discharge Instructions (Signed)
Follow-up with your family doctor if not improving 

## 2017-09-13 NOTE — ED Provider Notes (Signed)
[facility] EMERGENCY DEPARTMENT Provider Note   CSN: [id number] Arrival date & time: 09/13/17  2458     History   Chief Complaint Chief Complaint  Patient presents with  . Flank Pain    HPI Marc [name] is a 22 y.o. male.  Patient complains of low back pain and left flank pain   The history is provided by the patient. No language interpreter was used.  Back Pain   This is a recurrent problem. The current episode started more than 2 days ago. The problem occurs constantly. The problem has not changed since onset.The pain is associated with no known injury. The pain is present in the lumbar spine. The quality of the pain is described as aching. The pain is at a severity of 6/10. Pertinent negatives include no chest pain, no headaches and no abdominal pain.    Past Medical History:  Diagnosis Date  . Anxiety   . Asthma   . Depressed   . Polysubstance abuse ([place])   . Seizures ([place])   . Wears glasses     Patient Active Problem List   Diagnosis Date Noted  . MDD (major depressive disorder) 06/22/2017  . Major depressive disorder, recurrent severe without psychotic features ([place]) 06/21/2017  . Severe episode of recurrent major depressive disorder, without psychotic features ([place])   . Bipolar disorder, unspecified ([place]) 03/23/2017  . Depression, major, recurrent, in partial remission ([place]) 06/03/2011  . ELBOW SPRAIN, LEFT 09/17/2010    Past Surgical History:  Procedure Laterality Date  . COLON SURGERY    . FRACTURE SURGERY         Home Medications    Prior to Admission medications   Medication Sig Start Date End Date Taking? Authorizing Provider  gabapentin (NEURONTIN) 600 MG tablet Take 1 tablet (600 mg total) by mouth 3 (three) times daily. 06/24/17   McNew, Tyson Babinski, MD  hydrOXYzine (ATARAX/VISTARIL) 50 MG tablet Take 1 tablet (50 mg total) by mouth every 6 (six) hours as needed for anxiety. 06/24/17   McNew, Tyson Babinski, MD  naproxen (NAPROSYN) 500 MG tablet  Take one every 12 hours for back pain 09/13/17   Milton Ferguson, MD  ondansetron (ZOFRAN) 8 MG tablet Take 1 tablet (8 mg total) by mouth every 8 (eight) hours as needed for nausea or vomiting. 08/20/17   Daleen Bo, MD  PARoxetine (PAXIL) 10 MG tablet Take 1 tablet (10 mg total) by mouth at bedtime. 06/24/17   McNew, Tyson Babinski, MD    Family History Family History  Problem Relation Age of Onset  . Anxiety disorder Father   . Depression Father   . Anxiety disorder Paternal Grandfather   . Anxiety disorder Mother   . Depression Mother   . Depression Cousin   . Drug abuse Cousin     Social History Social History   Tobacco Use  . Smoking status: Current Every Day Smoker    Packs/day: 0.50    Types: Cigarettes  . Smokeless tobacco: Never Used  Substance Use Topics  . Alcohol use: No    Frequency: Never    Comment: very rarely per pt  . Drug use: Yes    Types: Marijuana    Comment: last use 09/12/17     Allergies   Promethazine hcl   Review of Systems Review of Systems  Constitutional: Negative for appetite change and fatigue.  HENT: Negative for congestion, ear discharge and sinus pressure.   Eyes: Negative for discharge.  Respiratory: Negative for  cough.   Cardiovascular: Negative for chest pain.  Gastrointestinal: Negative for abdominal pain and diarrhea.  Genitourinary: Negative for frequency and hematuria.  Musculoskeletal: Positive for back pain.  Skin: Negative for rash.  Neurological: Negative for seizures and headaches.  Psychiatric/Behavioral: Negative for hallucinations.     Physical Exam Updated Vital Signs BP 113/78   Pulse (!) 51   Temp 97.6 F (36.4 C) (Oral)   Resp 17   Ht '5\' 5"'$  (1.651 m)   Wt 56.7 kg (125 lb)   SpO2 100%   BMI 20.80 kg/m   Physical Exam  Constitutional: He is oriented to person, place, and time. He appears well-developed.  HENT:  Head: Normocephalic.  Eyes: Conjunctivae and EOM are normal. No scleral icterus.  Neck:  Neck supple. No thyromegaly present.  Cardiovascular: Normal rate and regular rhythm. Exam reveals no gallop and no friction rub.  No murmur heard. Pulmonary/Chest: No stridor. He has no wheezes. He has no rales. He exhibits no tenderness.  Abdominal: He exhibits no distension. There is no tenderness. There is no rebound.  Genitourinary:  Genitourinary Comments: Tender left flank  Musculoskeletal: Normal range of motion. He exhibits no edema.  Tender lumbar spine  Lymphadenopathy:    He has no cervical adenopathy.  Neurological: He is oriented to person, place, and time. He exhibits normal muscle tone. Coordination normal.  Skin: No rash noted. No erythema.  Psychiatric: He has a normal mood and affect. His behavior is normal.     ED Treatments / Results  Labs (all labs ordered are listed, but only abnormal results are displayed) Labs Reviewed  URINALYSIS, ROUTINE W REFLEX MICROSCOPIC - Abnormal; Notable for the following components:      Result Value   Specific Gravity, Urine 1.031 (*)    All other components within normal limits  COMPREHENSIVE METABOLIC PANEL - Abnormal; Notable for the following components:   Glucose, Bld 103 (*)    Total Bilirubin 0.2 (*)    All other components within normal limits  CBC WITH DIFFERENTIAL/PLATELET    EKG  EKG Interpretation None       Radiology Ct Renal Stone Study  Result Date: 09/13/2017 CLINICAL DATA:  Bilateral flank pain and nausea. EXAM: CT ABDOMEN AND PELVIS WITHOUT CONTRAST TECHNIQUE: Multidetector CT imaging of the abdomen and pelvis was performed following the standard protocol without IV contrast. COMPARISON:  CT AP 12/23/4 FINDINGS: Lower chest: No acute abnormality. Hepatobiliary: No focal liver abnormality is seen. No gallstones, gallbladder wall thickening, or biliary dilatation. Pancreas: Unremarkable. No pancreatic ductal dilatation or surrounding inflammatory changes. Spleen: Normal in size without focal abnormality.  Adrenals/Urinary Tract: Normal adrenal glands. No kidney stones or hydronephrosis identified. No urinary tract calculi visualized. Bladder normal. Stomach/Bowel: The stomach and small bowel loops are unremarkable. The appendix is not visualized separate from the right lower quadrant bowel loops. No secondary signs of acute appendicitis however. Unremarkable appearance of the colon. Vascular/Lymphatic: No significant vascular findings are present. No enlarged abdominal or pelvic lymph nodes. Reproductive: Prostate is unremarkable. Other: No abdominal wall hernia or abnormality. No abdominopelvic ascites. Musculoskeletal: No acute or significant osseous findings. IMPRESSION: 1. No acute findings within the abdomen or the pelvis. 2. No urinary tract calculi identified. Electronically Signed   By: Kerby Moors M.D.   On: 09/13/2017 10:09    Procedures Procedures (including critical care time)  Medications Ordered in ED Medications - No data to display   Initial Impression / Assessment and Plan / ED Course  I have reviewed the triage vital signs and the nursing notes.  Pertinent labs & imaging results that were available during my care of the patient were reviewed by me and considered in my medical decision making (see chart for details).   Patient was back pain.  CBC C met urinalysis and CT renal were all unremarkable.  Suspect musculoskeletal pain.  Patient will be discharged with Motrin and will follow-up as needed   Final Clinical Impressions(s) / ED Diagnoses   Final diagnoses:  Acute back pain less than 4 weeks duration    ED Discharge Orders        Ordered    naproxen (NAPROSYN) 500 MG tablet     09/13/17 1041       Milton Ferguson, MD 09/13/17 1927

## 2018-02-18 ENCOUNTER — Emergency Department (HOSPITAL_COMMUNITY)
Admission: EM | Admit: 2018-02-18 | Discharge: 2018-02-18 | Discharge status: Against Medical Advice | Payer: Self-pay | Attending: Emergency Medicine | Admitting: Emergency Medicine

## 2018-02-18 ENCOUNTER — Encounter (HOSPITAL_COMMUNITY): Payer: Self-pay | Admitting: *Deleted

## 2018-02-18 DIAGNOSIS — Z5321 Procedure and treatment not carried out due to patient leaving prior to being seen by health care provider: Secondary | ICD-10-CM | POA: Insufficient documentation

## 2018-02-18 DIAGNOSIS — R112 Nausea with vomiting, unspecified: Secondary | ICD-10-CM | POA: Insufficient documentation

## 2018-02-18 NOTE — ED Notes (Signed)
Called for pt and pt no longer in waiting room 

## 2018-02-18 NOTE — ED Triage Notes (Signed)
Note pt drinking a ginger ale in waiting room at present.

## 2018-02-18 NOTE — ED Triage Notes (Signed)
Pt nausea on and off for past few weeks, emesis x 1 today per pt.  + SOB and sore throat for past 2-3 weeks.  Pt denies fever.

## 2018-02-18 NOTE — ED Notes (Signed)
Pt no longer in waiting room 

## 2018-02-19 ENCOUNTER — Emergency Department (HOSPITAL_COMMUNITY)
Admission: EM | Admit: 2018-02-19 | Discharge: 2018-02-19 | Disposition: A | Discharge status: Home / Self Care | Payer: Self-pay | Attending: Emergency Medicine | Admitting: Emergency Medicine

## 2018-02-19 ENCOUNTER — Encounter (HOSPITAL_COMMUNITY): Payer: Self-pay | Admitting: Emergency Medicine

## 2018-02-19 ENCOUNTER — Other Ambulatory Visit: Payer: Self-pay

## 2018-02-19 DIAGNOSIS — R112 Nausea with vomiting, unspecified: Secondary | ICD-10-CM | POA: Insufficient documentation

## 2018-02-19 DIAGNOSIS — F17210 Nicotine dependence, cigarettes, uncomplicated: Secondary | ICD-10-CM | POA: Insufficient documentation

## 2018-02-19 DIAGNOSIS — J45909 Unspecified asthma, uncomplicated: Secondary | ICD-10-CM | POA: Insufficient documentation

## 2018-02-19 DIAGNOSIS — F419 Anxiety disorder, unspecified: Secondary | ICD-10-CM | POA: Insufficient documentation

## 2018-02-19 DIAGNOSIS — Z79899 Other long term (current) drug therapy: Secondary | ICD-10-CM | POA: Insufficient documentation

## 2018-02-19 LAB — COMPREHENSIVE METABOLIC PANEL
ALBUMIN: 4 g/dL (ref 3.5–5.0)
ALT: 21 U/L (ref 0–44)
AST: 20 U/L (ref 15–41)
Alkaline Phosphatase: 42 U/L (ref 38–126)
Anion gap: 5 (ref 5–15)
BILIRUBIN TOTAL: 0.7 mg/dL (ref 0.3–1.2)
BUN: 11 mg/dL (ref 6–20)
CALCIUM: 8.7 mg/dL — AB (ref 8.9–10.3)
CO2: 27 mmol/L (ref 22–32)
Chloride: 108 mmol/L (ref 98–111)
Creatinine, Ser: 0.79 mg/dL (ref 0.61–1.24)
GFR calc Af Amer: >60 mL/min (ref >60)
GFR calc non Af Amer: >60 mL/min (ref >60)
GLUCOSE: 94 mg/dL (ref 70–99)
Potassium: 3.9 mmol/L (ref 3.5–5.1)
Sodium: 140 mmol/L (ref 135–145)
TOTAL PROTEIN: 6.7 g/dL (ref 6.5–8.1)

## 2018-02-19 LAB — CBC
HEMATOCRIT: 41.6 % (ref 39.0–52.0)
Hemoglobin: 14.6 g/dL (ref 13.0–17.0)
MCH: 32.5 pg (ref 26.0–34.0)
MCHC: 35.1 g/dL (ref 30.0–36.0)
MCV: 92.7 fL (ref 78.0–100.0)
Platelets: 171 10*3/uL (ref 150–400)
RBC: 4.49 MIL/uL (ref 4.22–5.81)
RDW: 12.2 % (ref 11.5–15.5)
WBC: 6.1 10*3/uL (ref 4.0–10.5)

## 2018-02-19 LAB — URINALYSIS, ROUTINE W REFLEX MICROSCOPIC
BILIRUBIN URINE: NEGATIVE
Glucose, UA: NEGATIVE mg/dL
Hgb urine dipstick: NEGATIVE
Ketones, ur: NEGATIVE mg/dL
Leukocytes, UA: NEGATIVE
NITRITE: NEGATIVE
PH: 7 (ref 5.0–8.0)
Protein, ur: NEGATIVE mg/dL
Specific Gravity, Urine: 1.014 (ref 1.005–1.030)

## 2018-02-19 LAB — LIPASE, BLOOD: Lipase: 68 U/L — ABNORMAL HIGH (ref 11–51)

## 2018-02-19 MED ORDER — SODIUM CHLORIDE 0.9 % IV BOLUS (SEPSIS)
1000.0000 mL | Freq: Once | INTRAVENOUS | Status: AC
Start: 2018-02-19 — End: 2018-02-19
  Administered 2018-02-19: 1000 mL via INTRAVENOUS

## 2018-02-19 MED ORDER — ONDANSETRON HCL 4 MG/2ML IJ SOLN
4.0000 mg | Freq: Once | INTRAMUSCULAR | Status: AC
  Administered 2018-02-19: 4 mg via INTRAVENOUS
  Filled 2018-02-19: qty 2

## 2018-02-19 MED ORDER — LORAZEPAM 2 MG/ML IJ SOLN
0.5000 mg | Freq: Once | INTRAMUSCULAR | Status: AC
  Administered 2018-02-19: 0.5 mg via INTRAVENOUS
  Filled 2018-02-19: qty 1

## 2018-02-19 MED ORDER — HYDROXYZINE PAMOATE 25 MG PO CAPS: 25.0000 mg | ORAL_CAPSULE | Freq: Three times a day (TID) | ORAL | 0 refills | Status: DC | PRN

## 2018-02-19 NOTE — ED Triage Notes (Signed)
Patient c/o vomiting with nausea x2 weeks. Per patient small amount of diarrhea and dysuria. Denies any fevers. Patient states nausea and vomiting usual starts first thing  in morning when he wakes up and after eating. Last BM this morning-per patient normal, no blood noted. Patient had abd surgery when he was a child for bowel blockage.

## 2018-02-19 NOTE — Discharge Instructions (Addendum)
Please increase fluids.  Please use the Vistaril 3 times daily for both nausea and for assistance with your anxiety.  [facility] for [facility] assistance with your anxiety.  Your vital signs are within normal limits.  Your lab work is negative for acute changes or problems.

## 2018-02-19 NOTE — ED Provider Notes (Signed)
[facility] EMERGENCY DEPARTMENT Provider Note   CSN: [id number] Arrival date & time: 02/19/18  [id number]     History   Chief Complaint Chief Complaint  Patient presents with  . Emesis    HPI Marc [name] is a 22 y.o. male.  Patient is a 22 year old male who presents to the emergency department with nausea vomiting over the past 2 weeks.  The patient states that this usually occurs first thing in the morning.  He wakes up he is nauseated and majority of the times he has an episode of vomiting.  He did not feel like eating because he is fearful of vomiting.  He does eat around 4 PM.  And he says he is able to take a bedtime snack in the state out okay.  The patient feels that a lot of this may be related to anxiety.  He was on medication for anxiety, but went for a while without a job was unable to get his prescriptions filled.  Patient denies suicidal homicidal ideations appointments.  The patient denies any recent changes in his diet.  He is not taking any medications right now.  He denies use of any over-the-counter herbs or vitamins.  The patient denies the use of aspirin or aspirin related products.  He is a smoker.  He smokes 1 pack of cigarettes per day.  He drinks alcohol regularly.  His last alcohol intake was 2-3 beers on last night.  The patient states that he smokes marijuana.  He says this helps his nausea and helps him to eat.  He has not had any recent injury or trauma to the abdomen.  Patient did have abdominal surgery procedure during childhood, the patient is not sure exactly what type of surgery it was.  No operations since that time.  He presents now for assistance with the nausea/vomiting.  The history is provided by the patient.  Emesis   Pertinent negatives include no abdominal pain, no arthralgias and no cough.    Past Medical History:  Diagnosis Date  . Anxiety   . Asthma   . Depressed   . Polysubstance abuse ([facility])   . Seizures ([facility])   . Wears glasses      Patient Active Problem List   Diagnosis Date Noted  . MDD (major depressive disorder) 06/22/2017  . 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 Severe episode of recurrent major depressive disorder, without psychotic features ([facility])   . Bipolar disorder, unspecified ([facility]) 03/23/2017  . Depression, major, recurrent, in partial remission ([facility]) 06/03/2011  . ELBOW SPRAIN, LEFT 09/17/2010    Past Surgical History:  Procedure Laterality Date  . COLON SURGERY    . FRACTURE SURGERY          Home Medications    Prior to Admission medications   Medication Sig Start Date End Date Taking? Authorizing Provider  gabapentin (NEURONTIN) 600 MG tablet Take 1 tablet (600 mg total) by mouth 3 (three) times daily. 06/24/17   McNew, Ileene HutchinsonHolly R, MD  hydrOXYzine (ATARAX/VISTARIL) 50 MG tablet Take 1 tablet (50 mg total) by mouth every 6 (six) hours as needed for anxiety. 06/24/17   McNew, Ileene HutchinsonHolly R, MD  naproxen (NAPROSYN) 500 MG tablet Take one every 12 hours for back pain 09/13/17   Bethann BerkshireZammit, Joseph, MD  ondansetron (ZOFRAN) 8 MG tablet Take 1 tablet (8 mg total) by mouth every 8 (eight) hours as needed for nausea or vomiting. 08/20/17   Mancel BaleWentz, Elliott, MD  PARoxetine (PAXIL) 10 MG tablet Take 1 tablet (10 mg total) by mouth at bedtime. 06/24/17   McNew, Ileene Hutchinson, MD    Family History Family History  Problem Relation Age of Onset  . Anxiety disorder Father   . Depression Father   . Anxiety disorder Paternal Grandfather   . Anxiety disorder Mother   . Depression Mother   . Depression Cousin   . Drug abuse Cousin     Social History Social History   Tobacco Use  . Smoking status: Current Every Day Smoker    Packs/day: 0.50    Types: Cigarettes  . Smokeless tobacco: Never Used  Substance Use Topics  . Alcohol use: No    Frequency: Never    Comment: very rarely per pt  . Drug use: Yes    Types: Marijuana    Comment: last use 02/17/18      Allergies   Promethazine hcl   Review of Systems Review of Systems  Constitutional: Negative for activity change.       All ROS Neg except as noted in HPI  HENT: Negative for nosebleeds.   Eyes: Negative for photophobia and discharge.  Respiratory: Negative for cough, shortness of breath and wheezing.   Cardiovascular: Negative for chest pain and palpitations.  Gastrointestinal: Positive for vomiting. Negative for abdominal pain and blood in stool.  Genitourinary: Negative for dysuria, frequency and hematuria.  Musculoskeletal: Negative for arthralgias, back pain and neck pain.  Skin: Negative.   Neurological: Negative for dizziness, seizures and speech difficulty.  Psychiatric/Behavioral: Negative for confusion, hallucinations and suicidal ideas. The patient is nervous/anxious.      Physical Exam Updated Vital Signs BP 114/73 (BP Location: Left Arm)   Pulse 70   Temp 97.9 F (36.6 C) (Oral)   Resp 16   Ht 5\' 5"  (1.651 m)   Wt 59 kg (130 lb)   SpO2 98%   BMI 21.63 kg/m   Physical Exam  Constitutional: He is oriented to person, place, and time. He appears well-developed and well-nourished.  Non-toxic appearance.  HENT:  Head: Normocephalic.  Right Ear: Tympanic membrane and external ear normal.  Left Ear: Tympanic membrane and external ear normal.  Eyes: Pupils are equal, round, and reactive to light. EOM and lids are normal.  Neck: Normal range of motion. Neck supple. Carotid bruit is not present.  Cardiovascular: Normal rate, regular rhythm, normal heart sounds, intact distal pulses and normal pulses.  Pulmonary/Chest: No respiratory distress. He has wheezes.  Abdominal: Soft. Bowel sounds are normal. He exhibits no distension and no mass. There is no splenomegaly or hepatomegaly. There is no tenderness. There is no guarding and no CVA tenderness.  Musculoskeletal: Normal range of motion.  Lymphadenopathy:       Head (right side): No submandibular adenopathy  present.       Head (left side): No submandibular adenopathy present.    He has no cervical adenopathy.  Neurological: He is alert and oriented to person, place, and time. He has normal strength. No cranial nerve deficit or sensory deficit.  Skin: Skin is warm and dry.  Psychiatric: His speech is normal. His mood appears anxious. He expresses no homicidal and no suicidal ideation. He expresses no suicidal plans and no homicidal plans.  Nursing note and vitals reviewed.    ED Treatments / Results  Labs (all labs ordered are listed, but only abnormal results are displayed) Labs Reviewed  LIPASE, BLOOD - Abnormal; Notable for the following components:  Result Value   Lipase 68 (*)    All other components within normal limits  COMPREHENSIVE METABOLIC PANEL - Abnormal; Notable for the following components:   Calcium 8.7 (*)    All other components within normal limits  CBC  URINALYSIS, ROUTINE W REFLEX MICROSCOPIC    EKG None  Radiology No results found.  Procedures Procedures (including critical care time)  Medications Ordered in ED Medications - No data to display   Initial Impression / Assessment and Plan / ED Course  I have reviewed the triage vital signs and the nursing notes.  Pertinent labs & imaging results that were available during my care of the patient were reviewed by me and considered in my medical decision making (see chart for details).      Final Clinical Impressions(s) / ED Diagnoses MDm  Vital signs within normal limits.  Pulse oximetry is 98% on room air.  Within normal limits by my interpretation.  Patient states that he feels that the majority of his nausea may be related to anxiety.  The patient states that he was on medications for anxiety but is no longer on them.  The patient states he recently landed a job and would now be able to get his medications filled.  The patient denies any suicidal homicidal ideations or plans.  There is been no  vomiting since being in the emergency department, but the patient states he feels nauseated.  IV fluids will be started.  Will use IV Zofran.  We will give the patient 0.5 mg of Ativan to assist with his anxiety and nervousness.  Lipase is slightly elevated at 68.  This may also have something to do with the patient's nausea.  The comprehensive metabolic panel is well within normal limits.  Complete blood count is well within normal limits.  Patient feeling better after IV fluids and nausea medicine as well as anxiety medicine.  Patient will be treated with Vistaril over the next 7 days to give him a chance to get to [facility] or his primary physician to get assistance with his anxietyaymark as well as the nausea.   Final diagnoses:  Non-intractable vomiting with nausea, unspecified vomiting type  Anxiety    ED Discharge Orders    None       Ivery Quale, PA-C 02/19/18 2132    Blane Ohara, MD 02/20/18 [phone]

## 2018-03-28 ENCOUNTER — Encounter (HOSPITAL_COMMUNITY): Payer: Self-pay | Admitting: *Deleted

## 2018-03-28 ENCOUNTER — Emergency Department (HOSPITAL_COMMUNITY): Payer: Self-pay

## 2018-03-28 ENCOUNTER — Other Ambulatory Visit: Payer: Self-pay

## 2018-03-28 ENCOUNTER — Emergency Department (HOSPITAL_COMMUNITY)
Admission: EM | Admit: 2018-03-28 | Discharge: 2018-03-28 | Discharge status: Against Medical Advice | Payer: Self-pay | Attending: Emergency Medicine | Admitting: Emergency Medicine

## 2018-03-28 DIAGNOSIS — F329 Major depressive disorder, single episode, unspecified: Secondary | ICD-10-CM | POA: Insufficient documentation

## 2018-03-28 DIAGNOSIS — G8929 Other chronic pain: Secondary | ICD-10-CM | POA: Insufficient documentation

## 2018-03-28 DIAGNOSIS — F32A Depression, unspecified: Secondary | ICD-10-CM

## 2018-03-28 DIAGNOSIS — M25561 Pain in right knee: Secondary | ICD-10-CM | POA: Insufficient documentation

## 2018-03-28 DIAGNOSIS — Z008 Encounter for other general examination: Secondary | ICD-10-CM | POA: Insufficient documentation

## 2018-03-28 DIAGNOSIS — M25562 Pain in left knee: Secondary | ICD-10-CM | POA: Insufficient documentation

## 2018-03-28 DIAGNOSIS — R45851 Suicidal ideations: Secondary | ICD-10-CM | POA: Insufficient documentation

## 2018-03-28 LAB — COMPREHENSIVE METABOLIC PANEL
ALT: 39 U/L (ref 0–44)
AST: 24 U/L (ref 15–41)
Albumin: 4.5 g/dL (ref 3.5–5.0)
Alkaline Phosphatase: 49 U/L (ref 38–126)
Anion gap: 5 (ref 5–15)
BILIRUBIN TOTAL: 0.9 mg/dL (ref 0.3–1.2)
BUN: 15 mg/dL (ref 6–20)
CHLORIDE: 106 mmol/L (ref 98–111)
CO2: 29 mmol/L (ref 22–32)
CREATININE: 0.99 mg/dL (ref 0.61–1.24)
Calcium: 9.2 mg/dL (ref 8.9–10.3)
GFR calc Af Amer: >60 mL/min (ref >60)
Glucose, Bld: 104 mg/dL — ABNORMAL HIGH (ref 70–99)
Potassium: 3.6 mmol/L (ref 3.5–5.1)
Sodium: 140 mmol/L (ref 135–145)
TOTAL PROTEIN: 7.4 g/dL (ref 6.5–8.1)

## 2018-03-28 LAB — CBC
HCT: 43.5 % (ref 39.0–52.0)
Hemoglobin: 15.4 g/dL (ref 13.0–17.0)
MCH: 32.6 pg (ref 26.0–34.0)
MCHC: 35.4 g/dL (ref 30.0–36.0)
MCV: 92.2 fL (ref 78.0–100.0)
Platelets: 220 10*3/uL (ref 150–400)
RBC: 4.72 MIL/uL (ref 4.22–5.81)
RDW: 12.3 % (ref 11.5–15.5)
WBC: 9.5 10*3/uL (ref 4.0–10.5)

## 2018-03-28 LAB — RAPID URINE DRUG SCREEN, HOSP PERFORMED
Amphetamines: NOT DETECTED
Barbiturates: NOT DETECTED
Benzodiazepines: NOT DETECTED
Cocaine: NOT DETECTED
OPIATES: NOT DETECTED
Tetrahydrocannabinol: POSITIVE — AB

## 2018-03-28 LAB — ACETAMINOPHEN LEVEL: Acetaminophen (Tylenol), Serum: <10 ug/mL — ABNORMAL LOW (ref 10–30)

## 2018-03-28 LAB — ETHANOL

## 2018-03-28 LAB — SALICYLATE LEVEL

## 2018-03-28 MED ORDER — ZOLPIDEM TARTRATE 5 MG PO TABS: 5.0000 mg | ORAL_TABLET | Freq: Every evening | ORAL | Status: DC | PRN

## 2018-03-28 MED ORDER — ONDANSETRON HCL 4 MG PO TABS: 4.0000 mg | ORAL_TABLET | Freq: Three times a day (TID) | ORAL | Status: DC | PRN

## 2018-03-28 MED ORDER — ACETAMINOPHEN 325 MG PO TABS: 650.0000 mg | ORAL_TABLET | ORAL | Status: DC | PRN

## 2018-03-28 MED ORDER — NICOTINE 21 MG/24HR TD PT24: 21.0000 mg | MEDICATED_PATCH | Freq: Every day | TRANSDERMAL | Status: DC

## 2018-03-28 MED ORDER — ALUM & MAG HYDROXIDE-SIMETH 200-200-20 MG/5ML PO SUSP: 30.0000 mL | Freq: Four times a day (QID) | ORAL | Status: DC | PRN
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
## 2018-03-28 NOTE — ED Notes (Signed)
Patient in room very cooperative. Patient is becoming antsy, states that " if he was on drugs they would be happy to help him" but being suicidal is a joke. Patient wanting to know if the EDP will be back to see him. Reminded patient of order of this process.

## 2018-03-28 NOTE — ED Notes (Signed)
ED Provider at bedside. 

## 2018-03-28 NOTE — ED Notes (Signed)
Patient back in room

## 2018-03-28 NOTE — ED Notes (Signed)
Patient transported to X-ray 

## 2018-03-28 NOTE — ED Notes (Signed)
Patient ambulated to restroom. Patient upset because he feels that he is being overlooked. States that he would be better off dead. Patient upset that they have not come to xray his knees.

## 2018-03-28 NOTE — ED Provider Notes (Signed)
[facility] EMERGENCY DEPARTMENT Provider Note   CSN: [id number] Arrival date & time: 03/28/18  0243     History   Chief Complaint Chief Complaint  Patient presents with  . V70.1    HPI Marc [name] is a 22 y.o. male.  The history is provided by the patient.  He has a history of asthma, anxiety, depression, substance abuse, seizures with complaints of anxiety and depression.  He states that he lives with grandparents and that he has not been able to get a job because he does not have transportation and has to stay within a relatively short radius of where he is living.  He is very anxious about living situation because his grandmother just had a stroke.  He states that he is unable to sleep because of the anxiety.  Although he does not have actual suicidal thoughts, he has feelings that there is no use in continuing and that he wishes something would happen so he would not be around anymore.  He denies hallucinations.  He states he is not using drugs currently but he is concerned because other people were staying in the house are smoking crack and he is worried that he will resume using.  He does admit to crying spells and anhedonia as well as early morning wakening.  A second complaint, he complains of chronic knee pain which he relates to an accident he had while working.  He states knee pain sometimes keeps him up at night.  He is requesting x-rays be obtained.  Past Medical History:  Diagnosis Date  . Anxiety   . Asthma   . Depressed   . Polysubstance abuse (HCC)   . Seizures (HCC)   . Wears glasses     Patient Active Problem List   Diagnosis Date Noted  . MDD (major depressive disorder) 06/22/2017  . 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 Severe episode of recurrent major depressive disorder, without psychotic features (HCC)   . Bipolar disorder, unspecified (HCC) 03/23/2017  . Depression, major, recurrent, in partial  remission (HCC) 06/03/2011  . ELBOW SPRAIN, LEFT 09/17/2010    Past Surgical History:  Procedure Laterality Date  . COLON SURGERY    . FRACTURE SURGERY          Home Medications    Prior to Admission medications   Medication Sig Start Date End Date Taking? Authorizing Provider  gabapentin (NEURONTIN) 600 MG tablet Take 1 tablet (600 mg total) by mouth 3 (three) times daily. 06/24/17   McNew, Ileene Hutchinson, MD  hydrOXYzine (ATARAX/VISTARIL) 50 MG tablet Take 1 tablet (50 mg total) by mouth every 6 (six) hours as needed for anxiety. 06/24/17   McNew, Ileene Hutchinson, MD  hydrOXYzine (VISTARIL) 25 MG capsule Take 1 capsule (25 mg total) by mouth 3 (three) times daily as needed. 02/19/18   Ivery Quale, PA-C  naproxen (NAPROSYN) 500 MG tablet Take one every 12 hours for back pain 09/13/17   Bethann Berkshire, MD  ondansetron (ZOFRAN) 8 MG tablet Take 1 tablet (8 mg total) by mouth every 8 (eight) hours as needed for nausea or vomiting. 08/20/17   Mancel Bale, MD  PARoxetine (PAXIL) 10 MG tablet Take 1 tablet (10 mg total) by mouth at bedtime. 06/24/17   McNew, Ileene Hutchinson, MD    Family History Family History  Problem Relation Age of Onset  . Anxiety disorder Father   . Depression Father   . Anxiety disorder Paternal Grandfather   .  Anxiety disorder Mother   . Depression Mother   . Depression Cousin   . Drug abuse Cousin     Social History Social History   Tobacco Use  . Smoking status: Current Every Day Smoker    Packs/day: 0.50    Types: Cigarettes  . Smokeless tobacco: Never Used  Substance Use Topics  . Alcohol use: No    Frequency: Never    Comment: very rarely per pt  . Drug use: Yes    Types: Marijuana    Comment: last use 02/17/18     Allergies   Promethazine hcl   Review of Systems Review of Systems  All other systems reviewed and are negative.    Physical Exam Updated Vital Signs BP 116/73 (BP Location: Right Arm)   Pulse 82   Temp 98 F (36.7 C) (Oral)   Resp 18    Ht 5\' 7"  (1.702 m)   Wt 56.7 kg   SpO2 98%   BMI 19.58 kg/m   Physical Exam  Nursing note and vitals reviewed.  22 year old male, resting comfortably and in no acute distress. Vital signs are normal. Oxygen saturation is 98%, which is normal. Head is normocephalic and atraumatic. PERRLA, EOMI. Oropharynx is clear. Neck is nontender and supple without adenopathy or JVD. Back is nontender and there is no CVA tenderness. Lungs are clear without rales, wheezes, or rhonchi. Chest is nontender. Heart has regular rate and rhythm without murmur. Abdomen is soft, flat, nontender without masses or hepatosplenomegaly and peristalsis is normoactive. Extremities have no cyanosis or edema, full range of motion is present. Skin is warm and dry without rash. Neurologic: Mental status is normal, cranial nerves are intact, there are no motor or sensory deficits. Psychiatric: Somewhat flat affect and tendency towards rambling thoughts, close to flight of ideas.  ED Treatments / Results  Labs (all labs ordered are listed, but only abnormal results are displayed) Labs Reviewed  COMPREHENSIVE METABOLIC PANEL - Abnormal; Notable for the following components:      Result Value   Glucose, Bld 104 (*)    All other components within normal limits  ACETAMINOPHEN LEVEL - Abnormal; Notable for the following components:   Acetaminophen (Tylenol), Serum <10 (*)    All other components within normal limits  RAPID URINE DRUG SCREEN, HOSP PERFORMED - Abnormal; Notable for the following components:   Tetrahydrocannabinol POSITIVE (*)    All other components within normal limits  ETHANOL  SALICYLATE LEVEL  CBC   Imaging Results Dg Knee Complete 4 Views Left  Result Date: 03/28/2018 CLINICAL DATA:  Bilateral knee pain. Patient reports remote fracture. No recent injury. EXAM: LEFT KNEE - COMPLETE 4+ VIEW COMPARISON:  None. FINDINGS: No evidence of acute or healed fracture. Normal joint spaces and alignment. No  joint effusion. No evidence of arthropathy or other focal bone abnormality. Soft tissues are unremarkable. IMPRESSION: Negative radiographs of the left knee. Electronically Signed   By: Narda Rutherford M.D.   On: 03/28/2018 06:03   Dg Knee Complete 4 Views Right  Result Date: 03/28/2018 CLINICAL DATA:  Bilateral knee pain. Patient reports remote fracture. No recent injury. EXAM: RIGHT KNEE - COMPLETE 4+ VIEW COMPARISON:  None. FINDINGS: No evidence of acute or remote fracture. Normal joint spaces and alignment. No joint effusion. No evidence of arthropathy or other focal bone abnormality. Soft tissues are unremarkable. IMPRESSION: Negative radiographs of the right knee. Electronically Signed   By: Narda Rutherford M.D.   On: 03/28/2018 06:03  Procedures Procedures   Medications Ordered in ED Medications  nicotine (NICODERM CQ - dosed in mg/24 hours) patch 21 mg (has no administration in time range)  ondansetron (ZOFRAN) tablet 4 mg (has no administration in time range)  alum & mag hydroxide-simeth (MAALOX/MYLANTA) 200-200-20 MG/5ML suspension 30 mL (has no administration in time range)  zolpidem (AMBIEN) tablet 5 mg (has no administration in time range)  acetaminophen (TYLENOL) tablet 650 mg (has no administration in time range)     Initial Impression / Assessment and Plan / ED Course  I have reviewed the triage vital signs and the nursing notes.  Pertinent lab results that were available during my care of the patient were reviewed by me and considered in my medical decision making (see chart for details).  Anxiety and depression.  Old records are reviewed, he does have prior ED visits for suicidal thoughts, and hospitalizations for depression.  Will check screening labs and obtain consultation with TTS.  Screening labs are unremarkable.  Knee x-rays have been ordered.  TTS consultation is pending.  Knee x-rays are unremarkable.  Recommended follow-up with orthopedics.  Following  this, patient stated that he did not wish to stay, he was upset that we were not addressing his knee pain appropriately.  He then stated he wished to leave.  Since he was not suicidal with an active plan, it was felt that he could not be involuntarily committed.  I have offered him outpatient referrals, but he refused.  He was told that he is welcome to come back at any time, but he did not stay to get the referral sheets.  Final Clinical Impressions(s) / ED Diagnoses   Final diagnoses:  Depression, unspecified depression type  Chronic pain of both knees    ED Discharge Orders    None       Dione Booze, MD 03/28/18 [phone]

## 2018-03-28 NOTE — ED Notes (Addendum)
Patient states that he wants an Xray of his knee. Reminded patient that would be up to the Dr.

## 2018-03-28 NOTE — Discharge Instructions (Signed)
Take ibuprofen or naproxen for your knee pain.  If you change your mind about talking with our psychiatric social worker, you are welcome back at any time.

## 2018-03-28 NOTE — ED Notes (Signed)
Patient upset with staff at this time because " dnt nobody want to help him because he is 22 yrs old with no insurance , and nobody to help him. " State that he wants to leave because we are just here to make a paycheck off of people. States that he is gonna leave and kill himself and it would be our fault. RN Bronson Ing and Dr Preston Fleeting made aware. Patient wants MRI of his back and states that he can not afford it. Patient walked out.

## 2018-03-28 NOTE — ED Notes (Signed)
Patient informed A. Hunt, Tech he wants to leave, EDP Preston Fleeting informed, when patient was given clothes, he stated "If I go and kill myself, it will be on yall", when EDP Preston Fleeting tried to talk to patient about resources he cursed and walked out of ED.

## 2018-03-28 NOTE — ED Notes (Signed)
Patient wanded by security. Patient changed into paperscrubs. Patient rewanded. Patient gave urine specimen at this time.

## 2018-03-28 NOTE — ED Notes (Signed)
Dr. Preston Fleeting went in to talk with pt and pt walked out leaving the department while Dr. Preston Fleeting was talking with him; pt refused to come back and speak with EDP or staff

## 2018-03-28 NOTE — ED Notes (Signed)
Patient asking for Sprite at this time.

## 2018-04-04 ENCOUNTER — Encounter (HOSPITAL_COMMUNITY): Payer: Self-pay | Admitting: Emergency Medicine

## 2018-04-04 ENCOUNTER — Other Ambulatory Visit: Payer: Self-pay

## 2018-04-04 ENCOUNTER — Emergency Department (HOSPITAL_COMMUNITY)
Admission: EM | Admit: 2018-04-04 | Discharge: 2018-04-05 | Disposition: A | Discharge status: Other Acute Inpatient Hospital | Payer: Self-pay | Attending: Emergency Medicine | Admitting: Emergency Medicine

## 2018-04-04 DIAGNOSIS — F332 Major depressive disorder, recurrent severe without psychotic features: Secondary | ICD-10-CM | POA: Insufficient documentation

## 2018-04-04 DIAGNOSIS — F1220 Cannabis dependence, uncomplicated: Secondary | ICD-10-CM | POA: Insufficient documentation

## 2018-04-04 DIAGNOSIS — F419 Anxiety disorder, unspecified: Secondary | ICD-10-CM | POA: Insufficient documentation

## 2018-04-04 DIAGNOSIS — F329 Major depressive disorder, single episode, unspecified: Secondary | ICD-10-CM

## 2018-04-04 DIAGNOSIS — Z5900 Homelessness unspecified: Secondary | ICD-10-CM

## 2018-04-04 DIAGNOSIS — F17210 Nicotine dependence, cigarettes, uncomplicated: Secondary | ICD-10-CM | POA: Insufficient documentation

## 2018-04-04 DIAGNOSIS — J45909 Unspecified asthma, uncomplicated: Secondary | ICD-10-CM | POA: Insufficient documentation

## 2018-04-04 DIAGNOSIS — F32A Depression, unspecified: Secondary | ICD-10-CM

## 2018-04-04 DIAGNOSIS — Z79899 Other long term (current) drug therapy: Secondary | ICD-10-CM | POA: Insufficient documentation

## 2018-04-04 DIAGNOSIS — R45851 Suicidal ideations: Secondary | ICD-10-CM | POA: Insufficient documentation

## 2018-04-04 LAB — RAPID URINE DRUG SCREEN, HOSP PERFORMED
AMPHETAMINES: NOT DETECTED
BENZODIAZEPINES: POSITIVE — AB
Barbiturates: NOT DETECTED
Cocaine: NOT DETECTED
OPIATES: NOT DETECTED
TETRAHYDROCANNABINOL: POSITIVE — AB

## 2018-04-04 LAB — COMPREHENSIVE METABOLIC PANEL
ALBUMIN: 4.5 g/dL (ref 3.5–5.0)
ALT: 58 U/L — AB (ref 0–44)
AST: 34 U/L (ref 15–41)
Alkaline Phosphatase: 47 U/L (ref 38–126)
Anion gap: 9 (ref 5–15)
BUN: 17 mg/dL (ref 6–20)
CHLORIDE: 108 mmol/L (ref 98–111)
CO2: 24 mmol/L (ref 22–32)
CREATININE: 0.86 mg/dL (ref 0.61–1.24)
Calcium: 9.2 mg/dL (ref 8.9–10.3)
GFR calc Af Amer: >60 mL/min (ref >60)
Glucose, Bld: 116 mg/dL — ABNORMAL HIGH (ref 70–99)
POTASSIUM: 3.8 mmol/L (ref 3.5–5.1)
SODIUM: 141 mmol/L (ref 135–145)
Total Bilirubin: 0.8 mg/dL (ref 0.3–1.2)
Total Protein: 7.5 g/dL (ref 6.5–8.1)

## 2018-04-04 LAB — CBC
HCT: 46 % (ref 39.0–52.0)
HEMOGLOBIN: 16.3 g/dL (ref 13.0–17.0)
MCH: 32.8 pg (ref 26.0–34.0)
MCHC: 35.4 g/dL (ref 30.0–36.0)
MCV: 92.6 fL (ref 78.0–100.0)
Platelets: 219 10*3/uL (ref 150–400)
RBC: 4.97 MIL/uL (ref 4.22–5.81)
RDW: 12.5 % (ref 11.5–15.5)
WBC: 9.1 10*3/uL (ref 4.0–10.5)

## 2018-04-04 NOTE — ED Provider Notes (Signed)
[facility] EMERGENCY DEPARTMENT Provider Note   CSN: [id number] Arrival date & time: 04/04/18  2225  Time seen 23:38 PM    History   Chief Complaint Chief Complaint  Patient presents with  . V70.1    HPI Marc [name] is a 22 y.o. male.  HPI when I asked the patient was going on tonight he states "I do not know anymore".  He then elaborates and states he is afraid he is going to hurt himself or someone else.  He states he has a lot of built up anger.  He states he lives of his grandparents in a small house with 7-8 other people states brought his grandparents and do drugs.  He states "I cannot get ahead in life".  He states he is only person who helps take care of his grandparents who are in their 23s.  His grandfather is currently in the hospital at [facility].  Patient states he has a history of depression but states "medications do not help".  He has been admitted to psychiatric hospital 3 times in the past.  [facility] but he is not currently going anywhere.  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  I asked him how he is pain for his written now that he is not working he states he does not have to pay rent.  He states he has a lot of anxiety of him functioning normally.  He states his grandparents kicked him out all the time, and he was kicked out today.  He states he has nowhere to go.  He states his father is deceased and his mother has a boyfriend that he does not get along with so he cannot go there.  Patient states he only finished the eighth grade.  PCP Patient, No Pcp Per   Past Medical History:  Diagnosis Date  . Anxiety   . Asthma   . Depressed   . Polysubstance abuse (HCC)   . Seizures (HCC)   . Wears glasses     Patient Active Problem List   Diagnosis Date Noted  . MDD (major depressive  disorder) 06/22/2017  . 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 Severe episode of recurrent major depressive disorder, without psychotic features (HCC)   . Bipolar disorder, unspecified (HCC) 03/23/2017  . Depression, major, recurrent, in partial remission (HCC) 06/03/2011  . ELBOW SPRAIN, LEFT 09/17/2010    Past Surgical History:  Procedure Laterality Date  . COLON SURGERY    . FRACTURE SURGERY          Home Medications    Prior to Admission medications   Medication Sig Start Date End Date Taking? Authorizing Provider  gabapentin (NEURONTIN) 600 MG tablet Take 1 tablet (600 mg total) by mouth 3 (three) times daily. 06/24/17   McNew, Ileene Hutchinson, MD  hydrOXYzine (ATARAX/VISTARIL) 50 MG tablet Take 1 tablet (50 mg total) by mouth every 6 (six) hours as needed for anxiety. 06/24/17   McNew, Ileene Hutchinson, MD  hydrOXYzine (VISTARIL) 25 MG capsule Take 1 capsule (25 mg total) by mouth 3 (three) times daily as needed. 02/19/18   Ivery Quale, PA-C  naproxen (NAPROSYN) 500 MG tablet Take one every 12 hours for back pain 09/13/17  Bethann Berkshire, MD  ondansetron (ZOFRAN) 8 MG tablet Take 1 tablet (8 mg total) by mouth every 8 (eight) hours as needed for nausea or vomiting. 08/20/17   Mancel Bale, MD  PARoxetine (PAXIL) 10 MG tablet Take 1 tablet (10 mg total) by mouth at bedtime. 06/24/17   McNew, Ileene Hutchinson, MD    Family History Family History  Problem Relation Age of Onset  . Anxiety disorder Father   . Depression Father   . Anxiety disorder Paternal Grandfather   . Anxiety disorder Mother   . Depression Mother   . Depression Cousin   . Drug abuse Cousin     Social History Social History   Tobacco Use  . Smoking status: Current Every Day Smoker    Packs/day: 0.50    Types: Cigarettes  . Smokeless tobacco: Never Used  Substance Use Topics  . Alcohol use: No    Frequency: Never    Comment: very rarely per pt  . Drug use: Yes    Types:  Marijuana    Comment: last use 02/17/18  unemployed Was living with grandparents, was kicked out today Only finished 8th grade   Allergies   Promethazine hcl   Review of Systems Review of Systems  All other systems reviewed and are negative.    Physical Exam Updated Vital Signs BP 123/78 (BP Location: Right Arm)   Pulse 80   Temp 98.4 F (36.9 C) (Oral)   Resp 18   Ht 5\' 7"  (1.702 m)   Wt 59 kg   SpO2 100%   BMI 20.36 kg/m   Vital signs normal    Physical Exam  Constitutional: He is oriented to person, place, and time. He appears well-developed and well-nourished.  Non-toxic appearance. He does not appear ill. No distress.  HENT:  Head: Normocephalic and atraumatic.  Right Ear: External ear normal.  Left Ear: External ear normal.  Nose: Nose normal. No mucosal edema or rhinorrhea.  Mouth/Throat: Oropharynx is clear and moist and mucous membranes are normal. No dental abscesses or uvula swelling.  Eyes: Pupils are equal, round, and reactive to light. Conjunctivae and EOM are normal.  Neck: Normal range of motion and full passive range of motion without pain. Neck supple.  Cardiovascular: Normal rate, regular rhythm and normal heart sounds. Exam reveals no gallop and no friction rub.  No murmur heard. Pulmonary/Chest: Effort normal and breath sounds normal. No respiratory distress. He has no wheezes. He has no rhonchi. He has no rales. He exhibits no tenderness and no crepitus.  Abdominal: Soft. Normal appearance and bowel sounds are normal. He exhibits no distension. There is no tenderness. There is no rebound and no guarding.  Musculoskeletal: Normal range of motion. He exhibits no edema or tenderness.  Moves all extremities well.   Neurological: He is alert and oriented to person, place, and time. He has normal strength. No cranial nerve deficit.  Skin: Skin is warm, dry and intact. No rash noted. No erythema. No pallor.  Multiple tatooes  Psychiatric: His behavior  is normal. His affect is blunt. His speech is rapid and/or pressured.  Nursing note and vitals reviewed.    ED Treatments / Results  Labs (all labs ordered are listed, but only abnormal results are displayed) Results for orders placed or performed during the hospital encounter of 04/04/18  Comprehensive metabolic panel  Result Value Ref Range   Sodium 141 135 - 145 mmol/L   Potassium 3.8 3.5 - 5.1 mmol/L   Chloride 108  98 - 111 mmol/L   CO2 24 22 - 32 mmol/L   Glucose, Bld 116 (H) 70 - 99 mg/dL   BUN 17 6 - 20 mg/dL   Creatinine, Ser 4.090.86 0.61 - 1.24 mg/dL   Calcium 9.2 8.9 - 81.110.3 mg/dL   Total Protein 7.5 6.5 - 8.1 g/dL   Albumin 4.5 3.5 - 5.0 g/dL   AST 34 15 - 41 U/L   ALT 58 (H) 0 - 44 U/L   Alkaline Phosphatase 47 38 - 126 U/L   Total Bilirubin 0.8 0.3 - 1.2 mg/dL   GFR calc non Af Amer >60 >60 mL/min   GFR calc Af Amer >60 >60 mL/min   Anion gap 9 5 - 15  Ethanol  Result Value Ref Range   Alcohol, Ethyl (B) <10 <10 mg/dL  Salicylate level  Result Value Ref Range   Salicylate Lvl <7.0 2.8 - 30.0 mg/dL  Acetaminophen level  Result Value Ref Range   Acetaminophen (Tylenol), Serum <10 (L) 10 - 30 ug/mL  cbc  Result Value Ref Range   WBC 9.1 4.0 - 10.5 K/uL   RBC 4.97 4.22 - 5.81 MIL/uL   Hemoglobin 16.3 13.0 - 17.0 g/dL   HCT 91.446.0 78.239.0 - 95.652.0 %   MCV 92.6 78.0 - 100.0 fL   MCH 32.8 26.0 - 34.0 pg   MCHC 35.4 30.0 - 36.0 g/dL   RDW 21.312.5 08.611.5 - 57.815.5 %   Platelets 219 150 - 400 K/uL  Rapid urine drug screen (hospital performed)  Result Value Ref Range   Opiates NONE DETECTED NONE DETECTED   Cocaine NONE DETECTED NONE DETECTED   Benzodiazepines POSITIVE (A) NONE DETECTED   Amphetamines NONE DETECTED NONE DETECTED   Tetrahydrocannabinol POSITIVE (A) NONE DETECTED   Barbiturates NONE DETECTED NONE DETECTED        EKG None  Radiology No results found.   Dg Knee Complete 4 Views Left  Result Date: 03/28/2018 CLINICAL DATA:  Bilateral knee pain. Patient  reports remote fracture. No recent injury. IMPRESSION: Negative radiographs of the left knee. Electronically Signed   By: Narda RutherfordMelanie  Sanford M.D.   On: 03/28/2018 06:03   Dg Knee Complete 4 Views Right  Result Date: 03/28/2018 CLINICAL DATA:  Bilateral knee pain. Patient reports remote fracture. No recent injury. e. IMPRESSION: Negative radiographs of the right knee. Electronically Signed   By: Narda RutherfordMelanie  Sanford M.D.   On: 03/28/2018 06:03  Procedures Procedures (including critical care time)  Medications Ordered in ED Medications - No data to display   Initial Impression / Assessment and Plan / ED Course  I have reviewed the triage vital signs and the nursing notes.  Pertinent labs & imaging results that were available during my care of the patient were reviewed by me and considered in my medical decision making (see chart for details).     12:05 a.m. placed in psych holding orders.  TTS consult ordered  7:12 AM Berna SpareMarcus, TTS recommends inpatient psychiatric admission.  They will work on placement.  Final Clinical Impressions(s) / ED Diagnoses   Final diagnoses:  Anxiety  Depression, unspecified depression type  Homeless    Plan inpatient psychiatric admission  Devoria AlbeIva Lulia Schriner, MD, Concha PyoFACEP    Tzirel Leonor, MD 04/05/18 [phone]

## 2018-04-04 NOTE — ED Triage Notes (Signed)
Pt states he is really irritable and feels like he "cannot calm down and states he might either hurt someone or himself. Pt has these feelings d/t his living situation but won't elaborate. Pt states he does not have a plan in place to harm self just feelings of doing so.

## 2018-04-05 ENCOUNTER — Inpatient Hospital Stay (HOSPITAL_COMMUNITY)
Admission: AD | Admit: 2018-04-05 | Discharge: 2018-04-08 | DRG: 885 | Disposition: A | Discharge status: Home / Self Care | Payer: Federal, State, Local not specified - Other | Source: Intra-hospital | Attending: Psychiatry | Admitting: Psychiatry

## 2018-04-05 ENCOUNTER — Other Ambulatory Visit: Payer: Self-pay | Admitting: Registered Nurse

## 2018-04-05 ENCOUNTER — Encounter (HOSPITAL_COMMUNITY): Payer: Self-pay | Admitting: *Deleted

## 2018-04-05 ENCOUNTER — Other Ambulatory Visit: Payer: Self-pay

## 2018-04-05 DIAGNOSIS — F332 Major depressive disorder, recurrent severe without psychotic features: Principal | ICD-10-CM | POA: Insufficient documentation

## 2018-04-05 DIAGNOSIS — Z818 Family history of other mental and behavioral disorders: Secondary | ICD-10-CM

## 2018-04-05 DIAGNOSIS — F411 Generalized anxiety disorder: Secondary | ICD-10-CM | POA: Diagnosis not present

## 2018-04-05 DIAGNOSIS — J45909 Unspecified asthma, uncomplicated: Secondary | ICD-10-CM | POA: Diagnosis present

## 2018-04-05 DIAGNOSIS — F17210 Nicotine dependence, cigarettes, uncomplicated: Secondary | ICD-10-CM | POA: Diagnosis present

## 2018-04-05 DIAGNOSIS — Z888 Allergy status to other drugs, medicaments and biological substances status: Secondary | ICD-10-CM | POA: Diagnosis not present

## 2018-04-05 DIAGNOSIS — G4700 Insomnia, unspecified: Secondary | ICD-10-CM | POA: Diagnosis present

## 2018-04-05 DIAGNOSIS — R45851 Suicidal ideations: Secondary | ICD-10-CM | POA: Diagnosis present

## 2018-04-05 DIAGNOSIS — F419 Anxiety disorder, unspecified: Secondary | ICD-10-CM | POA: Diagnosis present

## 2018-04-05 DIAGNOSIS — F909 Attention-deficit hyperactivity disorder, unspecified type: Secondary | ICD-10-CM | POA: Diagnosis present

## 2018-04-05 DIAGNOSIS — F1210 Cannabis abuse, uncomplicated: Secondary | ICD-10-CM | POA: Diagnosis present

## 2018-04-05 LAB — ACETAMINOPHEN LEVEL: Acetaminophen (Tylenol), Serum: <10 ug/mL — ABNORMAL LOW (ref 10–30)

## 2018-04-05 LAB — ETHANOL: Alcohol, Ethyl (B): <10 mg/dL (ref <10)

## 2018-04-05 LAB — SALICYLATE LEVEL: Salicylate Lvl: <7 mg/dL (ref 2.8–30.0)

## 2018-04-05 MED ORDER — NICOTINE POLACRILEX 2 MG MT GUM
2.0000 mg | CHEWING_GUM | OROMUCOSAL | Status: DC | PRN
  Administered 2018-04-06 – 2018-04-07 (×2): 2 mg via ORAL

## 2018-04-05 MED ORDER — NICOTINE 14 MG/24HR TD PT24
14.0000 mg | MEDICATED_PATCH | Freq: Once | TRANSDERMAL | Status: DC
  Administered 2018-04-05: 14 mg via TRANSDERMAL
  Filled 2018-04-05: qty 1

## 2018-04-05 MED ORDER — ENSURE ENLIVE PO LIQD: 237.0000 mL | Freq: Two times a day (BID) | ORAL | Status: DC

## 2018-04-05 MED ORDER — TRAZODONE HCL 50 MG PO TABS
50.0000 mg | ORAL_TABLET | Freq: Every evening | ORAL | Status: DC | PRN
  Administered 2018-04-05 – 2018-04-06 (×2): 50 mg via ORAL
  Filled 2018-04-05 (×2): qty 1

## 2018-04-05 NOTE — Progress Notes (Signed)
D: Patient presents with flat, sullen affect.  He states, "I live with my grandparents.  My grandmother has problems.  She's on antipsychotics and stuff."  Patient states that "there are too many people living in that house.  They all do drugs."  Patient denies any SI with any specific plan at this time.  He denies HI toward anyone specific.

## 2018-04-05 NOTE — ED Notes (Signed)
Patient transported with [facility]. Belongings given to transport service.

## 2018-04-05 NOTE — Tx Team (Signed)
Initial Treatment Plan 04/05/2018 3:28 PM Marc A Thrall [id number]    PATIENT STRESSORS: Educational concerns Financial difficulties Marital or family conflict Medication change or noncompliance Occupational concerns Substance abuse   PATIENT STRENGTHS: Ability for insight Average or above average intelligence Capable of independent living Communication skills General fund of knowledge Motivation for treatment/growth Physical Health Work skills   PATIENT IDENTIFIED PROBLEMS: "anxiety"   "depression"  "panic"  "substance abuse"  "suicidal thoughts"             DISCHARGE CRITERIA:  Ability to meet basic life and health needs Adequate post-discharge living arrangements Improved stabilization in mood, thinking, and/or behavior Medical problems require only outpatient monitoring Motivation to continue treatment in a less acute level of care Need for constant or close observation no longer present Reduction of life-threatening or endangering symptoms to within safe limits Safe-care adequate arrangements made Verbal commitment to aftercare and medication compliance Withdrawal symptoms are absent or subacute and managed without 24-hour nursing intervention  PRELIMINARY DISCHARGE PLAN: Attend aftercare/continuing care group Attend PHP/IOP Attend 12-step recovery group Outpatient therapy Participate in family therapy Placement in alternative living arrangements  PATIENT/FAMILY INVOLVEMENT: This treatment plan has been presented to and reviewed with the patient, Marc [name].  The patient and family have been given the opportunity to ask questions and make suggestions.  Quintella ReichertKnight, Mikhail Hallenbeck [place], RN 04/05/2018, 3:28 PM

## 2018-04-05 NOTE — ED Notes (Signed)
Patient denies any thoughts to harm himself at this time. Indicates that he feels "pretty good".

## 2018-04-05 NOTE — ED Notes (Signed)
Pt sleeping. Nad. Chest rise and fall noted. Sitter at bedside.  

## 2018-04-05 NOTE — Plan of Care (Signed)
  Problem: Education: Goal: Knowledge of [facility] Education information/materials will improve Outcome: Progressing Goal: Emotional status will improve Outcome: Progressing Goal: Mental status will improve Outcome: Progressing Goal: Verbalization of understanding the information provided will improve Outcome: Progressing   Problem: Activity: Goal: Interest or engagement in activities will improve Outcome: Progressing Goal: Sleeping patterns will improve Outcome: Progressing   Problem: Coping: Goal: Ability to verbalize frustrations and anger appropriately will improve Outcome: Progressing Goal: Ability to demonstrate self-control will improve Outcome: Progressing   Problem: Health Behavior/Discharge Planning: Goal: Identification of resources available to assist in meeting health care needs will improve Outcome: Progressing Goal: Compliance with treatment plan for underlying cause of condition will improve Outcome: Progressing   Problem: Physical Regulation: Goal: Ability to maintain clinical measurements within normal limits will improve Outcome: Progressing   Problem: Safety: Goal: Periods of time without injury will increase Outcome: Progressing   Problem: Education: Goal: Ability to make informed decisions regarding treatment will improve Outcome: Progressing   Problem: Coping: Goal: Coping ability will improve Outcome: Progressing   Problem: Coping: Goal: Ability to identify and develop effective coping behavior will improve Outcome: Progressing   Problem: Coping: Goal: Coping ability will improve Outcome: Progressing Goal: Will verbalize feelings Outcome: Progressing   Problem: Coping: Goal: Ability to identify and develop effective coping behavior will improve Outcome: Progressing Goal: Will verbalize positive feelings about self Outcome: Progressing Goal: Verbalizations of decreased anxiety will increase Outcome: Progressing   Problem:  Education: Goal: Knowledge of disease or condition will improve Outcome: Progressing Goal: Understanding of discharge needs will improve Outcome: Progressing

## 2018-04-05 NOTE — ED Notes (Signed)
Pt would like a nicotine patch. RN notified.

## 2018-04-05 NOTE — ED Provider Notes (Signed)
Patient accepted by behavioral health by Dr. Jama Flavorsobos.  Patient will be transferred to behavioral health at 1230 today.  This is in the [facility] system so EMTALA forms not required   Vanetta MuldersZackowski, Dorsie Burich, MD 04/05/18 1038

## 2018-04-05 NOTE — Progress Notes (Signed)
[facility]  Nira ConnJason Berry, NP is the accepting provider.  Nehemiah MassedFernando Cobos, MD  is the attending provider.  Call report to [phone]  Robin@ [facility] notified.   Pt is Voluntary.  Pt may be transported by [facility]  [facility]30  Timmothy EulerJean T. Kaylyn LimSutter, MSW, LCSWA Disposition Clinical Social Work [phone] (cell) [phone] (office)

## 2018-04-05 NOTE — Plan of Care (Signed)
Nurse discussed anxiety, depression and coping skills with patient.  

## 2018-04-05 NOTE — BHH Group Notes (Signed)
Adult Psychoeducational Group Note  Date:  04/05/2018 Time:  4:00 pm  Group Topic/Focus: Therapeutic Relaxation Self Care:   The focus of this group is to help patients understand the importance of self-care in order to improve or restore emotional, physical, spiritual, interpersonal, and financial health.  Participation Level:  Active  Participation Quality:  Appropriate  Affect:  Appropriate  Cognitive:  Alert and Oriented  Insight: Improving  Engagement in Group:  Developing/Improving  Modes of Intervention:  Activity, Discussion, Education, Rapport Building and Socialization  Additional Comments:  Patient listened but did not contribute orally.

## 2018-04-05 NOTE — Progress Notes (Signed)
Patient ID: Marc [name], male   DOB: 23-Oct-1995, 22 y.o.   MRN: [id number] Patient is 22 yr old male, [facility].  Patient stated he lives with his grandparents and other grandchildren in the family.  The other grandchildren take advantage of grandparents, use car, steal money, one is in jail.  Grandchildren use drugs and he does not want to live in that situation.  Grandparents do not want to help him or let him use the car to look for a job.  Grandmother threw him out yesterday.  Stated he has no one to help him.  Mother and her boyfriend do talk occasionally.  Rated hopeless 10, anxiety 20, depression 0.  SI and HI yesterday.  Denied SI and HI today, contracts for safety.  Denied A/V hallucinations.  THC daily, 2 blunts daily since age 22 yrs old.  One beer weekly since age 22 yrs old.  Stopped using cocaine one yr ago.  No heroin.  Takes nerve pills from friends when having panic attack, usually one pill monthly.  Multiple tattoos over chest, back, arms, legs.  Scratch marks on back.  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  [facility], but no money for treatment or prescriptions now.  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 High fall risk, [facility].  Feels dizzy, lightheaded at times.   Patient oriented to unit, given food/drink.
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
## 2018-04-05 NOTE — BHH Group Notes (Signed)
Adult Psychoeducational Group Note  Date:  04/05/2018 Time:  10:41 PM  Group Topic/Focus:  Wrap-Up Group:   The focus of this group is to help patients review their daily goal of treatment and discuss progress on daily workbooks.  Participation Level:  Active  Participation Quality:  Appropriate and Attentive  Affect:  Appropriate  Cognitive:  Alert and Appropriate  Insight: Appropriate and Good  Engagement in Group:  Engaged  Modes of Intervention:  Discussion and Education  Additional Comments:  Pt attended and participated in wrap up group this evening. Pt rated their day an 8/10, due to them being able to get some rest today. Pt overall goal is to find housing and to look for employment. Pt is planning to get information from their SW.    Chrisandra NettersOctavia A Dorrene Bently 04/05/2018, 10:41 PM

## 2018-04-05 NOTE — ED Notes (Signed)
[facility] Needs to arrive at 1-130pm. Have voluntary consent signed and and fax  Call report to [phone] Accepting Cobos Secretary and nurse aware of transfer

## 2018-04-05 NOTE — ED Notes (Signed)
[facility] 

## 2018-04-06 ENCOUNTER — Encounter (HOSPITAL_COMMUNITY): Payer: Self-pay | Admitting: Registered Nurse

## 2018-04-06 DIAGNOSIS — G4700 Insomnia, unspecified: Secondary | ICD-10-CM

## 2018-04-06 DIAGNOSIS — F411 Generalized anxiety disorder: Secondary | ICD-10-CM

## 2018-04-06 DIAGNOSIS — F332 Major depressive disorder, recurrent severe without psychotic features: Principal | ICD-10-CM

## 2018-04-06 DIAGNOSIS — F17210 Nicotine dependence, cigarettes, uncomplicated: Secondary | ICD-10-CM

## 2018-04-06 MED ORDER — ARIPIPRAZOLE 2 MG PO TABS
2.0000 mg | ORAL_TABLET | Freq: Every day | ORAL | Status: DC
  Administered 2018-04-06: 2 mg via ORAL
  Filled 2018-04-06 (×4): qty 1

## 2018-04-06 MED ORDER — HYDROXYZINE HCL 25 MG PO TABS
25.0000 mg | ORAL_TABLET | Freq: Three times a day (TID) | ORAL | Status: DC | PRN
  Administered 2018-04-06 – 2018-04-07 (×2): 25 mg via ORAL
  Filled 2018-04-06 (×2): qty 1

## 2018-04-06 MED ORDER — LORAZEPAM 1 MG PO TABS: 1.0000 mg | ORAL_TABLET | Freq: Four times a day (QID) | ORAL | Status: DC | PRN

## 2018-04-06 MED ORDER — GABAPENTIN 100 MG PO CAPS
100.0000 mg | ORAL_CAPSULE | Freq: Three times a day (TID) | ORAL | Status: DC
  Administered 2018-04-06 – 2018-04-07 (×3): 100 mg via ORAL
  Filled 2018-04-06 (×8): qty 1

## 2018-04-06 MED ORDER — SERTRALINE HCL 50 MG PO TABS
50.0000 mg | ORAL_TABLET | Freq: Every day | ORAL | Status: DC
  Administered 2018-04-06 – 2018-04-08 (×3): 50 mg via ORAL
  Filled 2018-04-06: qty 14
  Filled 2018-04-06 (×5): qty 1

## 2018-04-06 MED ORDER — VITAMIN B-1 100 MG PO TABS
100.0000 mg | ORAL_TABLET | Freq: Every day | ORAL | Status: DC
  Administered 2018-04-07 – 2018-04-08 (×2): 100 mg via ORAL
  Filled 2018-04-06 (×4): qty 1

## 2018-04-06 MED ORDER — ADULT MULTIVITAMIN W/MINERALS CH
1.0000 | ORAL_TABLET | Freq: Every day | ORAL | Status: DC
  Administered 2018-04-06 – 2018-04-08 (×3): 1 via ORAL
  Filled 2018-04-06 (×5): qty 1

## 2018-04-06 NOTE — Progress Notes (Signed)
Nutrition Brief Note  Patient identified on the Malnutrition Screening Tool (MST) Report  Wt Readings from Last 15 Encounters:  04/05/18 57.2 kg  04/04/18 59 kg  03/28/18 56.7 kg  02/19/18 59 kg  02/18/18 59 kg  09/13/17 56.7 kg  08/20/17 56.7 kg  06/22/17 54.9 kg  06/19/17 55.8 kg  06/16/17 55.8 kg  03/23/17 55.3 kg  03/21/17 54.4 kg  01/14/17 54.4 kg  12/01/16 52.2 kg  06/23/16 52.2 kg    Body mass index is 19.73 kg/m. Patient meets criteria for normal weight based on current BMI. Slight weight fluctuations recently, but overall stable. Skin WDL.   Current diet order is Regular and patient is eating as desired for meals and snacks at this time. Labs and medications reviewed. Ensure Enlive was ordered BID at time of admission per ONS protocol. Patient refusing supplement; will d/c. No additional nutrition interventions warranted at this time. If nutrition issues arise, please consult RD.     Trenton GammonJessica Dovey Fatzinger, MS, RD, LDN, [facility] Inpatient Clinical Dietitian Pager # [phone] After hours/weekend pager # [phone]

## 2018-04-06 NOTE — Progress Notes (Signed)
D:  Patient's self inventory sheet, patient has poor sleep, sleep medication not helpful.  Fair appetite, low energy level, poor concentration.  Rated depression 7, hopeless and anxiety 10.  Denied withdrawals, nausea, irritability.  Denied SI.  Physical problems, pain.  Physical pain, back, knees, worst pain #5 in past 24 hours.  No pain medicine.  Goal is be happy.  Plans to relax.  No discharge plans. A:  Medications administered per MD orders.  Emotional support and encouragement given patient. R:  Denied SI and HI, contracts for safety.  Denied A/V hallucinations.  Safety maintained with 15 minute checks.

## 2018-04-06 NOTE — B[facility] Suicide Risk Assessment (Signed)
[facility] INPATIENT:  Family/Significant Other Suicide Prevention Education  Suicide Prevention Education:  Patient Refusal for Family/Significant Other Suicide Prevention Education: The patient [name] has refused to provide written consent for family/significant other to be provided Family/Significant Other Suicide Prevention Education during admission and/or prior to discharge.  Physician notified.  SPE completed with pt, as pt refused to consent to family contact. SPI pamphlet provided to pt and pt was encouraged to share information with support network, ask questions, and talk about any concerns relating to SPE. Pt denies access to guns/firearms and verbalized understanding of information provided. Mobile Crisis information also provided to pt.   Rona RavensHeather S Anjelita Sheahan LCSW 04/06/2018, 12:26 PM

## 2018-04-06 NOTE — Plan of Care (Signed)
Nurse discussed anxiety, depression, coping skills with patient. 
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
## 2018-04-06 NOTE — Progress Notes (Signed)
  D: Pt denies SI/HI/AVH. Pt is pleasant and cooperative. Pt stated he had "Arthritis". Pt was given non-pharmacological methods to help relieve his knee pain. Pt encouraged seek out orthopedic to help diagnose it.  A: Pt was offered support and encouragement. Pt was given scheduled medications. Pt was encourage to attend groups. Q 15 minute checks were done for safety.  R:Pt attends groups and interacts well with peers and staff. Pt is taking medication. Pt has no complaints.Pt receptive to treatment and safety maintained on unit.  Problem: Education: Goal: Emotional status will improve Outcome: Progressing   Problem: Education: Goal: Mental status will improve Outcome: Progressing   Problem: Activity: Goal: Sleeping patterns will improve Outcome: Progressing

## 2018-04-06 NOTE — [facility] Group Notes (Signed)
[facility] Group Notes:  (Nursing/MHT/Case Management/Adjunct)  Date:  04/06/2018  Time:  4:00 pm  Type of Therapy:  Psychoeducational Skills  Participation Level:  Active  Participation Quality:  Appropriate  Affect:  Appropriate  Cognitive:  Appropriate  Insight:  Appropriate  Engagement in Group:  Engaged  Modes of Intervention:  Education  Summary of Progress/Problems: Patient was cooperative and participated in group.   Earline MayotteKnight, Reyna Lorenzi Shephard 04/06/2018, 6:58 PM

## 2018-04-06 NOTE — Progress Notes (Signed)
Nursing note 7p-7a  Pt observed interacting with peers on unit this shift. Displayed a flat affect and anxious mood upon interaction with this Clinical research associatewriter. Pt complains of pain in knees and would like something added for pain. Pt denies SI/HI, and also denies any audio or visual hallucinations at this time. Pt complains of increased anxiety and want to get his medication regimen straightened out.  Pt is able to verbally contract for safety with this RN. Goal: to find alternative living arrangement after discharge. Pt complained of insomnia. PRN Trazodone given as ordered by MD. See MAR for prn medication administration. Pt is now resting in bed with eyes closed, with no signs or symptoms of pain or distress noted. Pt continues to remain safe on the unit and is observed by rounding every 15 min. RN will continue to monitor.

## 2018-04-06 NOTE — [facility] Group Notes (Signed)
LCSW Group Therapy Note   04/06/2018 1:15pm   Type of Therapy and Topic:  Group Therapy:  Overcoming Obstacles   Participation Level:  Did Not Attend--pt invited. Chose to remain in bed.    Description of Group:    In this group patients will be encouraged to explore what they see as obstacles to their own wellness and recovery. They will be guided to discuss their thoughts, feelings, and behaviors related to these obstacles. The group will process together ways to cope with barriers, with attention given to specific choices patients can make. Each patient will be challenged to identify changes they are motivated to make in order to overcome their obstacles. This group will be process-oriented, with patients participating in exploration of their own experiences as well as giving and receiving support and challenge from other group members.   Therapeutic Goals: 1. Patient will identify personal and current obstacles as they relate to admission. 2. Patient will identify barriers that currently interfere with their wellness or overcoming obstacles.  3. Patient will identify feelings, thought process and behaviors related to these barriers. 4. Patient will identify two changes they are willing to make to overcome these obstacles:      Summary of Patient Progress   x   Therapeutic Modalities:   Cognitive Behavioral Therapy Solution Focused Therapy Motivational Interviewing Relapse Prevention Therapy  Rona RavensHeather S Shaylon Gillean, LCSW 04/06/2018 3:29 PM
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
## 2018-04-06 NOTE — Tx Team (Signed)
Interdisciplinary Treatment and Diagnostic Plan Update  04/06/2018 Time of Session: 0830AM Marc [name] MRN: [id number]  Principal Diagnosis: MDD, recurrent, severe  Secondary Diagnoses: Active Problems:   MDD (major depressive disorder), recurrent episode, severe (HCC)   Current Medications:  Current Facility-Administered Medications  Medication Dose Route Frequency Provider Last Rate Last Dose  . nicotine polacrilex (NICORETTE) gum 2 mg  2 mg Oral PRN Cobos, Rockey SituFernando A, MD   2 mg at 04/06/18 0759  . traZODone (DESYREL) tablet 50 mg  50 mg Oral QHS PRN Cobos, Rockey SituFernando A, MD   50 mg at 04/05/18 2110   PTA Medications: Medications Prior to Admission  Medication Sig Dispense Refill Last Dose  . gabapentin (NEURONTIN) 600 MG tablet Take 1 tablet (600 mg total) by mouth 3 (three) times daily. (Patient not taking: Reported on 04/05/2018) 90 tablet 0 Not Taking at Unknown time  . hydrOXYzine (ATARAX/VISTARIL) 50 MG tablet Take 1 tablet (50 mg total) by mouth every 6 (six) hours as needed for anxiety. (Patient not taking: Reported on 04/05/2018) 90 tablet 0 Not Taking at Unknown time  . hydrOXYzine (VISTARIL) 25 MG capsule Take 1 capsule (25 mg total) by mouth 3 (three) times daily as needed. (Patient not taking: Reported on 04/05/2018) 21 capsule 0 Completed Course at Unknown time  . naproxen (NAPROSYN) 500 MG tablet Take one every 12 hours for back pain (Patient not taking: Reported on 04/05/2018) 20 tablet 0 Not Taking at Unknown time  . ondansetron (ZOFRAN) 8 MG tablet Take 1 tablet (8 mg total) by mouth every 8 (eight) hours as needed for nausea or vomiting. (Patient not taking: Reported on 04/05/2018) 20 tablet 0 Not Taking at Unknown time  . PARoxetine (PAXIL) 10 MG tablet Take 1 tablet (10 mg total) by mouth at bedtime. (Patient not taking: Reported on 04/05/2018) 30 tablet 0 Not Taking at Unknown time    Patient Stressors: Educational concerns Financial difficulties Marital or family  conflict Medication change or noncompliance Occupational concerns Substance abuse  Patient Strengths: Ability for insight Average or above average intelligence Capable of independent living Wellsite geologistCommunication skills General fund of knowledge Motivation for treatment/growth Physical Health Work skills  Treatment Modalities: Medication Management, Group therapy, Case management,  1 to 1 session with clinician, Psychoeducation, Recreational therapy.   Physician Treatment Plan for Primary Diagnosis: MDD, recurrent, severe  Medication Management: Evaluate patient's response, side effects, and tolerance of medication regimen.  Therapeutic Interventions: 1 to 1 sessions, Unit Group sessions and Medication administration.  Evaluation of Outcomes: Progressing  Physician Treatment Plan for Secondary Diagnosis: Active Problems:   MDD (major depressive disorder), recurrent episode, severe (HCC)   Medication Management: Evaluate patient's response, side effects, and tolerance of medication regimen.  Therapeutic Interventions: 1 to 1 sessions, Unit Group sessions and Medication administration.  Evaluation of Outcomes: Progressing   RN Treatment Plan for Primary Diagnosis:MDD, recurrent, severe Long Term Goal(s): Knowledge of disease and therapeutic regimen to maintain health will improve  Short Term Goals: Ability to remain free from injury will improve, Ability to verbalize frustration and anger appropriately will improve, Ability to demonstrate self-control and Ability to participate in decision making will improve  Medication Management: RN will administer medications as ordered by provider, will assess and evaluate patient's response and provide education to patient for prescribed medication. RN will report any adverse and/or side effects to prescribing provider.  Therapeutic Interventions: 1 on 1 counseling sessions, Psychoeducation, Medication administration, Evaluate responses to  treatment, Monitor vital signs and CBGs  as ordered, Perform/monitor CIWA, COWS, AIMS and Fall Risk screenings as ordered, Perform wound care treatments as ordered.  Evaluation of Outcomes: Progressing   LCSW Treatment Plan for Primary Diagnosis: MDD, recurrent, severe Long Term Goal(s): Safe transition to appropriate next level of care at discharge, Engage patient in therapeutic group addressing interpersonal concerns.  Short Term Goals: Engage patient in aftercare planning with referrals and resources, Increase emotional regulation, Facilitate patient progression through stages of change regarding substance use diagnoses and concerns and Identify triggers associated with mental health/substance abuse issues  Therapeutic Interventions: Assess for all discharge needs, 1 to 1 time with Social worker, Explore available resources and support systems, Assess for adequacy in community support network, Educate family and significant other(s) on suicide prevention, Complete Psychosocial Assessment, Interpersonal group therapy.  Evaluation of Outcomes: Progressing   Progress in Treatment: Attending groups: Yes. Participating in groups: No. Taking medication as prescribed: Yes. Toleration medication: Yes. Family/Significant other contact made: SPE completed with pt; pt declined to consent to collateral contact.  Patient understands diagnosis: Yes. Discussing patient identified problems/goals with staff: Yes. Medical problems stabilized or resolved: Yes. Denies suicidal/homicidal ideation: Yes. Issues/concerns per patient self-inventory: No. Other: n/a   New problem(s) identified: No, Describe:  n/a  New Short Term/Long Term Goal(s): medication management for mood stabilization; elimination of SI thoughts; development of comprehensive mental wellness/sobriety plan.   Patient Goals:  "To learn how to manage my anxiety and get medications worked on."   Discharge Plan or Barriers: CSW assessing  for appropriate referrals. Pt reluctant to leave [place] but has been given Copy information. Pt plans to return home with grandmother; follow[facility]up at [facility]. MHAG pamphlet, Mobile Crisis information, and AA/NA information provided to patient for additional community support and resources.   Reason for Continuation of Hospitalization: Aggression Anxiety Depression Medication stabilization Suicidal ideation  Estimated Length of Stay: Monday, 04/11/18  Attendees: Patient: 04/06/2018 12:27 PM  Physician: Dr. Jama Flavors MD; Dr. Altamese Tabor MD 04/06/2018 12:27 PM  Nursing: Meriam Sprague RN; [place] RN 04/06/2018 12:27 PM  RN Care Manager:x 04/06/2018 12:27 PM  Social Worker: Corrie Mckusick LCSW 04/06/2018 12:27 PM  Recreational Therapist: x 04/06/2018 12:27 PM  Other: Armandina Stammer NP 04/06/2018 12:27 PM  Other:  04/06/2018 12:27 PM  Other: 04/06/2018 12:27 PM    Scribe for Treatment Team: Rona Ravens, LCSW 04/06/2018 12:27 PM
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
## 2018-04-06 NOTE — Progress Notes (Signed)
Recreation Therapy Notes  Date: 9.18.19 Time: 0930 Location: 300 Hall Dayroom  Group Topic: Stress Management  Goal Area(s) Addresses:  Patient will verbalize importance of using healthy stress management.  Patient will identify positive emotions associated with healthy stress management.   Behavioral Response: Engaged  Intervention: Stress Management  Activity :  Meditation.  LRT introduced the stress management technique of meditation to patients.  LRT played [name] meditation on choice for patients.  Patients were to follow along as meditation played to engage in the activity.  Education:  Stress Management, Discharge Planning.   Education Outcome: Acknowledges edcuation/In group clarification offered/Needs additional education  Clinical Observations/Feedback: Pt attended and participated in group.    Marc RancherMarjette Jaelynn [name], LRT/CTRS         Marc AbedLindsay, Marc [name] 04/06/2018 10:48 AM

## 2018-04-07 MED ORDER — HYDROXYZINE HCL 50 MG PO TABS
50.0000 mg | ORAL_TABLET | Freq: Three times a day (TID) | ORAL | Status: DC | PRN
  Filled 2018-04-07: qty 20

## 2018-04-07 MED ORDER — IBUPROFEN 600 MG PO TABS
600.0000 mg | ORAL_TABLET | Freq: Four times a day (QID) | ORAL | Status: DC | PRN
  Administered 2018-04-07 – 2018-04-08 (×2): 600 mg via ORAL
  Filled 2018-04-07 (×2): qty 1

## 2018-04-07 MED ORDER — TRAZODONE HCL 100 MG PO TABS
100.0000 mg | ORAL_TABLET | Freq: Every evening | ORAL | Status: DC | PRN
  Administered 2018-04-07: 100 mg via ORAL
  Filled 2018-04-07: qty 14
  Filled 2018-04-07: qty 1

## 2018-04-07 MED ORDER — GABAPENTIN 300 MG PO CAPS
300.0000 mg | ORAL_CAPSULE | Freq: Three times a day (TID) | ORAL | Status: DC
  Administered 2018-04-07 – 2018-04-08 (×2): 300 mg via ORAL
  Filled 2018-04-07 (×3): qty 1
  Filled 2018-04-07: qty 42
  Filled 2018-04-07 (×2): qty 1
  Filled 2018-04-07 (×2): qty 42
  Filled 2018-04-07 (×2): qty 1

## 2018-04-07 NOTE — Progress Notes (Addendum)
D: Patient is alert, oriented, pleasant, and cooperative. Denies SI, HI, AVH, and verbally contracts for safety. Patient reports he selpt poor last night and the medication he requested for sleep did not help. Patient reports his appetite is poor, his energy level is low, and his concentration is good. Patient rates his depression at 5/10, hopelessness 5/10, and anxiety 10/10. Patient requested anxiety medication and nicotine gum at 1100. Patient reports he is not withdrawing from drugs/alcohol. Patient reports he is feeling nauseous, having pain 5/10 in knees, and pain 5/10 in left shoulder. Patient reports his goal for today is "figure out what I'm doing". When discussing his family and what he might do when he leaves here patient reports he is "not sure about leaving [place]".   A: Scheduled medications administered per MD order. Support provided. Routine safety checks every 15 minutes. Patient stated understanding to tell nurse about any new physical symptoms he experiences. Patient understands to tell staff if he needs anything.    R: No adverse drug reactions noted. Patient verbally contracts for safety. Patient remains safe at this time and will continue to monitor.

## 2018-04-07 NOTE — Progress Notes (Signed)
Pt attended goals and orientation group this morning, participation was moderate. 

## 2018-04-07 NOTE — Progress Notes (Signed)
Adult Psychoeducational Group Note  Date:  04/07/2018 Time:  8:56 PM  Group Topic/Focus:  Wrap-Up Group:   The focus of this group is to help patients review their daily goal of treatment and discuss progress on daily workbooks.  Participation Level:  Active  Participation Quality:  Appropriate  Affect:  Appropriate  Cognitive:  Alert  Insight: Appropriate  Engagement in Group:  Engaged  Modes of Intervention:  Discussion  Additional Comments:  Patient stated his day was okay. Patient's goal for today was to figure out his discharge plan. Patient did not met his goal but will work towards it tomorrow.  Yazlyn Wentzel L Radha Coggins 04/07/2018, 8:56 PM

## 2018-04-07 NOTE — Plan of Care (Signed)
  Problem: Education: Goal: Knowledge of [place] [facility] Education information/materials will improve Outcome: Progressing   Problem: Coping: Goal: Ability to verbalize frustrations and anger appropriately will improve Outcome: Progressing   Problem: Physical Regulation: Goal: Ability to maintain clinical measurements within normal limits will improve Outcome: Progressing   Problem: Safety: Goal: Periods of time without injury will increase Outcome: Progressing   Problem: Self-Concept: Goal: Ability to disclose and discuss suicidal ideas will improve Outcome: Progressing  Patient vitals within normal limits. Patient verbalizing physical symptoms, verbalizing frustrations about living circumstances/family, and denies SI/HI/AVH. Patient remains safe.

## 2018-04-07 NOTE — BHH Group Notes (Signed)
LCSW Group Therapy Note  04/07/2018 1:15pm  Type of Therapy/Topic:  Group Therapy:  Balance in Life  Participation Level:  Minimal  Description of Group:    This group will address the concept of balance and how it feels and looks when one is unbalanced. Patients will be encouraged to process areas in their lives that are out of balance and identify reasons for remaining unbalanced. Facilitators will guide patients in utilizing problem-solving interventions to address and correct the stressor making their life unbalanced. Understanding and applying boundaries will be explored and addressed for obtaining and maintaining a balanced life. Patients will be encouraged to explore ways to assertively make their unbalanced needs known to significant others in their lives, using other group members and facilitator for support and feedback.  Therapeutic Goals: 1. Patient will identify two or more emotions or situations they have that consume much of in their lives. 2. Patient will identify signs/triggers that life has become out of balance:  3. Patient will identify two ways to set boundaries in order to achieve balance in their lives:  4. Patient will demonstrate ability to communicate their needs through discussion and/or role plays  Summary of Patient Progress:  Marc [name] was inattentive and disengaged during today's processing group. When prompted by CSW, pt reported that he felt "unbalanced, with no sleep, no appetite, and feeling physically sick." Marc [name] continues to demonstrate limited insight and poor engagement in the group setting.   Therapeutic Modalities:   Cognitive Behavioral Therapy Solution-Focused Therapy Assertiveness Training  Rona RavensHeather S Shawnika [place], [place] 04/07/2018 3:23 PM

## 2018-04-07 NOTE — Progress Notes (Addendum)
[facility] MD Progress Note  04/07/2018 2:50 PM Marc [name]  MRN:  [id number] Subjective:  " I am feeling a little better". Reports lingering anxiety, worry, and states anxiety symptoms are more significant/severe than depression. States Vistaril has been partially helpful, but " the effect does not last long".  States he was feeling nauseous earlier today, no vomiting. Denies suicidal ideations. Objective: I have discussed case with treatment team and have met with patient. 22 y old single male, presented to ED voluntarily for worsening anxiety, feeling overwhelmed, vague SI, triggered in part by housing/family stressors (states he was living with grandmother , with multiple other people living in the household/using drugs, resulting in tense, chaotic environment). Patient reports he has spoken with his mother and will be able to live with her after discharge . Currently remains  anxious, vaguely dysphoric and  irritable. Denies suicidal ideations and presents future oriented, stating plans to go live with mother after discharge. Reports AM nausea, which may have been related to Zoloft , now resolved. We discussed options, agrees to continue Zoloft trial, will monitor for GI side effects. No current WDL symptoms- no tremors, no diaphoresis, no restlessness, vitals stable. Visible in day room, going to some groups, cooperative on approach.  Principal Problem: Severe episode of recurrent major depressive disorder, without psychotic features ([place]) Diagnosis:   Patient Active Problem List   Diagnosis Date Noted  . MDD (major depressive disorder), recurrent episode, severe ([place]) [F33.2] 04/05/2018  . MDD (major depressive disorder) [F32.9] 06/22/2017  . Major depressive disorder, recurrent severe without psychotic features ([place]) [F33.2] 06/21/2017  . Severe episode of recurrent major depressive disorder, without psychotic features ([place]) [F33.2]   . Bipolar disorder, unspecified ([place]) [F31.9]  03/23/2017  . Depression, major, recurrent, in partial remission ([place]) [F33.41] 06/03/2011  . ELBOW SPRAIN, LEFT [[id number]] 09/17/2010   Total Time spent with patient: 20 minutes  Past Psychiatric History:  Past Medical History:  Past Medical History:  Diagnosis Date  . Anxiety   . Asthma   . Depressed   . Polysubstance abuse ([place])   . Seizures ([place])   . Wears glasses     Past Surgical History:  Procedure Laterality Date  . COLON SURGERY    . FRACTURE SURGERY     Family History:  Family History  Problem Relation Age of Onset  . Anxiety disorder Father   . Depression Father   . Anxiety disorder Paternal Grandfather   . Anxiety disorder Mother   . Depression Mother   . Depression Cousin   . Drug abuse Cousin    Family Psychiatric  History:  Social History:  Social History   Substance and Sexual Activity  Alcohol Use Yes  . Frequency: Never   Comment: one beer a week     Social History   Substance and Sexual Activity  Drug Use Yes  . Frequency: 7.0 times per week  . Types: Marijuana   Comment: couple blunts per day    Social History   Socioeconomic History  . Marital status: Divorced    Spouse name: Not on file  . Number of children: Not on file  . Years of education: 8 grade  . Highest education level: 8th grade  Occupational History  . Not on file  Social Needs  . Financial resource strain: Very hard  . Food insecurity:    Worry: Often true    Inability: Often true  . Transportation needs:    Medical: Yes    Non-medical:  Yes  Tobacco Use  . Smoking status: Current Every Day Smoker    Packs/day: 1.00    Types: Cigarettes  . Smokeless tobacco: Never Used  Substance and Sexual Activity  . Alcohol use: Yes    Frequency: Never    Comment: one beer a week  . Drug use: Yes    Frequency: 7.0 times per week    Types: Marijuana    Comment: couple blunts per day  . Sexual activity: Not Currently    Birth control/protection: None  Lifestyle  .  Physical activity:    Days per week: 3 days    Minutes per session: 30 min  . Stress: Very much  Relationships  . Social connections:    Talks on phone: Once a week    Gets together: Once a week    Attends religious service: Never    Active member of club or organization: No    Attends meetings of clubs or organizations: Never    Relationship status: Divorced  Other Topics Concern  . Not on file  Social History Narrative   Lives with grandparents and other grandchildren who take drugs, abuse grandparents.   Additional Social History:    Pain Medications: stated he does not have income to buy prescriptions Prescriptions: cannot affored meds Over the Counter: ibuprofen History of alcohol / drug use?: Yes Longest period of sobriety (when/how long): unknown Negative Consequences of Use: Financial, Personal relationships, Work / Youth worker Withdrawal Symptoms: Agitation Name of Substance 1: maijuana 1 - Age of First Use: 22 yrs old 1 - Amount (size/oz): couple blunts daily 1 - Frequency: daily 1 - Duration: since age 33 yrs old 1 - Last Use / Amount: 04/03/2018 Name of Substance 2: alcohol 2 - Age of First Use: 22 yrs old 2 - Amount (size/oz): one beer per week 2 - Frequency: one beer weekly 2 - Duration: off/on 2 - Last Use / Amount: does not remember  Sleep: Fair- states woke up several times last night.  Appetite:  Fair  Current Medications: Current Facility-Administered Medications  Medication Dose Route Frequency Provider Last Rate Last Dose  . gabapentin (NEURONTIN) capsule 100 mg  100 mg Oral TID Jet Armbrust, Myer Peer, MD   100 mg at 04/07/18 1214  . hydrOXYzine (ATARAX/VISTARIL) tablet 25 mg  25 mg Oral TID PRN Rankin, Shuvon B, NP   25 mg at 04/07/18 1100  . ibuprofen (ADVIL,MOTRIN) tablet 600 mg  600 mg Oral Q6H PRN Duward Allbritton, Myer Peer, MD      . LORazepam (ATIVAN) tablet 1 mg  1 mg Oral Q6H PRN Benuel Ly, Myer Peer, MD      . multivitamin with minerals tablet 1 tablet  1  tablet Oral Daily Hyun Marsalis, Myer Peer, MD   1 tablet at 04/07/18 0826  . nicotine polacrilex (NICORETTE) gum 2 mg  2 mg Oral PRN Henleigh Robello, Myer Peer, MD   2 mg at 04/07/18 1101  . sertraline (ZOLOFT) tablet 50 mg  50 mg Oral Daily Jamauri Kruzel, Myer Peer, MD   50 mg at 04/07/18 0826  . thiamine (VITAMIN B-1) tablet 100 mg  100 mg Oral Daily Kelly Eisler, Myer Peer, MD   100 mg at 04/07/18 0825  . traZODone (DESYREL) tablet 50 mg  50 mg Oral QHS PRN Edgel Degnan, Myer Peer, MD   50 mg at 04/06/18 2203    Lab Results: No results found for this or any previous visit (from the past 48 hour(s)).  Blood Alcohol level:  Lab Results  Component Value Date   ETH <10 04/04/2018   ETH <10 60/63/0160    Metabolic Disorder Labs: Lab Results  Component Value Date   HGBA1C 5.6 04/28/2011   MPG 114 04/28/2011   No results found for: PROLACTIN Lab Results  Component Value Date   CHOL 140 04/28/2011   TRIG 55 04/28/2011   HDL 48 04/28/2011   CHOLHDL 2.9 04/28/2011   VLDL 11 04/28/2011   LDLCALC 81 04/28/2011    Physical Findings: AIMS: Facial and Oral Movements Muscles of Facial Expression: None, normal Lips and Perioral Area: None, normal Jaw: None, normal Tongue: None, normal,Extremity Movements Upper (arms, wrists, hands, fingers): None, normal Lower (legs, knees, ankles, toes): None, normal, Trunk Movements Neck, shoulders, hips: None, normal, Overall Severity Severity of abnormal movements (highest score from questions above): None, normal Incapacitation due to abnormal movements: None, normal Patient's awareness of abnormal movements (rate only patient's report): No Awareness, Dental Status Current problems with teeth and/or dentures?: No Does patient usually wear dentures?: No  CIWA:  CIWA-Ar Total: 1 COWS:  COWS Total Score: 2  Musculoskeletal: Strength & Muscle Tone: within normal limits Gait & Station: normal Patient leans: N/A  Psychiatric Specialty Exam: Physical Exam  ROS describes some  AM nausea, no vomiting, no diarrhea. Describes bilateral knee pain .   Blood pressure 128/77, pulse 87, temperature 98.6 F (37 C), temperature source Oral, resp. rate 18, height '5\' 7"'$  (1.702 m), weight 57.2 kg, SpO2 100 %.Body mass index is 19.73 kg/m.  General Appearance: Fairly Groomed  Eye Contact:  Fair  Speech:  Normal Rate  Volume:  Normal  Mood:  partially improved, remains anxious   Affect:  vaguely dysphoric, anxious, tends to improve partially during session  Thought Process:  Linear and Descriptions of Associations: Intact  Orientation:  Full (Time, Place, and Person)  Thought Content:  no hallucinations, no delusions, not internally preoccupied   Suicidal Thoughts:  No denies any suicidal or self injurious ideations at this time, denies homicidal or violent ideations  Homicidal Thoughts:  No  Memory:  recent and remote grossly intact   Judgement:  Fair  Insight:  Fair  Psychomotor Activity:  Normal  Concentration:  Concentration: Good and Attention Span: Good  Recall:  Good  Fund of Knowledge:  Good  Language:  Good  Akathisia:  Negative  Handed:  Right  AIMS (if indicated):     Assets:  Desire for Improvement Resilience  ADL's:  Intact  Cognition:  WNL  Sleep:  Number of Hours: 6.75   Assessment -64 y old single male, presented to ED voluntarily for worsening anxiety, feeling overwhelmed, vague SI, triggered in part by housing/family stressors (states he was living with grandmother , with multiple other people living in the household/using drugs, resulting in tense, chaotic environment). Today reports some improvement, but describes persistent anxiety, and presents vaguely anxious, irritable. Currently not presenting with symptoms of BZD withdrawal. Denies SI or HI, and is future oriented, stating he may be able to move in with his mother after discharge . Reports some AM nausea, which may be related to Zoloft, which is new medication trial for patient. We discussed  options- agrees to continue Zoloft trial , will monitor for side effects/ GI symptoms.  Treatment Plan Summary: Daily contact with patient to assess and evaluate symptoms and progress in treatment, Medication management, Plan inpatient treatment and medications as below Continue to encourage group and milieu participation to work on coping skills and symptom reduction Continue Zoloft  50 mgrs QDAY for mood disorder, anxiety. Monitor for side effects. Increase Neurontin to 300 mgrs TID for anxiety, pain Increase Vistaril to 50 mgrs TID PRN for anxiety Increase Trazodone to 100 mgrs QHS PRN for insomnia Continue Ativan 1 mgr Q 6 hours PRN for potential WDL symptoms. Check TSH Jenne Campus, MD 04/07/2018, 2:50 PM

## 2018-04-08 LAB — TSH: TSH: 1.762 u[IU]/mL (ref 0.350–4.500)

## 2018-04-08 MED ORDER — SERTRALINE HCL 50 MG PO TABS: 50.0000 mg | ORAL_TABLET | Freq: Every day | ORAL | 0 refills | Status: DC

## 2018-04-08 MED ORDER — NICOTINE 14 MG/24HR TD PT24
MEDICATED_PATCH | TRANSDERMAL | Status: AC
  Administered 2018-04-08: 08:00:00 via TOPICAL
  Filled 2018-04-08: qty 1

## 2018-04-08 MED ORDER — GABAPENTIN 300 MG PO CAPS: 300.0000 mg | ORAL_CAPSULE | Freq: Three times a day (TID) | ORAL | 0 refills | Status: DC

## 2018-04-08 MED ORDER — NICOTINE 14 MG/24HR TD PT24
14.0000 mg | MEDICATED_PATCH | Freq: Every day | TRANSDERMAL | Status: DC
  Filled 2018-04-08 (×2): qty 1

## 2018-04-08 MED ORDER — TRAZODONE HCL 100 MG PO TABS: 100.0000 mg | ORAL_TABLET | Freq: Every evening | ORAL | 0 refills | Status: DC | PRN

## 2018-04-08 MED ORDER — HYDROXYZINE HCL 50 MG PO TABS: 50.0000 mg | ORAL_TABLET | Freq: Three times a day (TID) | ORAL | 0 refills | Status: DC | PRN

## 2018-04-08 NOTE — BHH Suicide Risk Assessment (Addendum)
[facility] Discharge Suicide Risk Assessment   Principal Problem: Severe episode of recurrent major depressive disorder, without psychotic features [facility]) Discharge Diagnoses:  Patient Active Problem List   Diagnosis Date Noted  . MDD (major depressive disorder), recurrent episode, severe ([facility]) [F33.2] 04/05/2018  . MDD (major depressive disorder) [F32.9] 06/22/2017  . Major depressive disorder, recurrent severe without psychotic features ([facility]) [F33.2] 06/21/2017  . Severe episode of recurrent major depressive disorder, without psychotic features ([facility]) [F33.2]   . Bipolar disorder, unspecified ([facility]) [F31.9] 03/23/2017  . Depression, major, recurrent, in partial remission ([facility]) [F33.41] 06/03/2011  . ELBOW SPRAIN, LEFT [[id number]] 09/17/2010    Total Time spent with patient: 30 minutes  Musculoskeletal: Strength & Muscle Tone: within normal limits Gait & Station: normal Patient leans: N/A  Psychiatric Specialty Exam: ROS no headache, no chest pain, no shortness of breath, no vomiting , no fever, no chills   Blood pressure 114/88, pulse 88, temperature 97.6 F (36.4 C), resp. rate 16, height 5\' 7"  (1.702 m), weight 57.2 kg, SpO2 100 %.Body mass index is 19.73 kg/m.  General Appearance: improving grooming   Eye Contact::  Good  Speech:  Normal Rate409  Volume:  Normal  Mood:  improving mood , reports feeling better   Affect:  more reactive, less anxious, smiles at times appropriately  Thought Process:  Linear and Descriptions of Associations: Intact  Orientation:  Full (Time, Place, and Person)  Thought Content:  no hallucinations , no delusions, not internally preoccupied   Suicidal Thoughts:  No- denies suicidal or self injurious ideations, denies homicidal or violent ideations  Homicidal Thoughts:  No  Memory:  recent and remote grossly intact   Judgement:  Other:  fair, improving   Insight:  Fair and improving   Psychomotor Activity:  Normal  Concentration:  Good  Recall:  Good   Fund of Knowledge:Good  Language: Good  Akathisia:  Negative  Handed:  Right  AIMS (if indicated):     Assets:  Desire for Improvement Resilience  Sleep:  Number of Hours: 6.75  Cognition: WNL  ADL's:  Intact   Mental Status Per Nursing Assessment::   On Admission:  Suicidal ideation indicated by patient, Self-harm behaviors, Self-harm thoughts, Thoughts of violence towards others  Demographic Factors:  22, single, no children, unemployed, was living with grandmother but states he plans to move in with mother now .  Loss Factors: Unemployment, reports home environment ( was living with grandmother) was tense, chaotic  Historical Factors: History of chronic anxiety, history of depression, history of prior psychiatric admission, history of ETOH use disorder  Risk Reduction Factors:   Living with another person, especially a relative and Positive coping skills or problem solving skills  Continued Clinical Symptoms:  At this time patient is alert, attentive, well related, improved grooming, improving mood, affect more reactive, less anxious, no thought disorder, no suicidal or self injurious ideations, no homicidal or violent ideations, no hallucinations, no delusions, not internally preoccupied, future oriented , states he has a potential job interview soon. Denies medication side effects. Side effects reviewed, including risk of sedation- patient advised not to drive or operate machinery if sedated  Behavior on unit in good control, no disruptive or agitated behaviors Labs - TSH WNL.  Cognitive Features That Contribute To Risk:  No gross cognitive deficits noted upon discharge. Is alert , attentive, and oriented x 3   Suicide Risk:  Mild:  Suicidal ideation of limited frequency, intensity, duration, and specificity.  There are no identifiable  plans, no associated intent, mild dysphoria and related symptoms, good self-control (both objective and subjective assessment), few other  risk factors, and identifiable protective factors, including available and accessible social support.  Follow-up Information    [facility] Follow up on 04/12/2018.   Why:  Hospital follow-up on Tuesday, 04/12/18 at 10:00AM. Please bring the following if you have them: photo ID and hospital discharge paperwork. Thank you.  Contact information: [address]  [place] [id number] [phone]           Plan Of Care/Follow-up recommendations:  Activity:  as tolerated Diet:  regular Tests:  NA Other:  See below  Patient is requesting discharge, no current grounds for involuntary commitment  Plans to go live with mother Follow up as above   Craige CottaFernando A Cobos, MD 04/08/2018, 9:28 AM

## 2018-04-08 NOTE — Progress Notes (Signed)
D Pt is escorted to bldg entrance today- after dc instrucitons were reviewed with him by Forde RadonAJ RN. Pt completed his daily assessment and on this he wrote he deneid SI today and he rated his depression, hopelessness and anxetiy " 0/3/1", repectively. He is given cc of dc instructions ( SRA, AVS, SSP and transition record) and all belongings returned to hi and he was taken to bldg entrance and dc'd.
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
## 2018-04-08 NOTE — Progress Notes (Signed)
D: Patient denies SI, HI or AVH. Patient presents as anxious and depressed but cooperative.  Pt. 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  Pt. States that he is doing better and denies any new complaints or needs.  Pt. States his appetite is good and sleep has been choppy but is aware that his trazadone was increased this evening.    A: Patient given emotional support from RN. Patient encouraged to come to staff with concerns and/or questions. Patient's medication routine continued. Patient's orders and plan of care reviewed.   R: Patient remains appropriate and cooperative. Will continue to monitor patient q15 minutes for safety.

## 2018-04-08 NOTE — Progress Notes (Signed)
  [facility] :  Will you be returning to the same living situation after discharge:  No.Pt is planning to live with his mother at discharge.  At discharge, do you have transportation home?: Yes,  pt's mother is coming at 11am Do you have the ability to pay for your medications: Yes,  mental health  Release of information consent forms completed and submitted to medical records by CSW.   Patient to Follow up at: Follow-up Information    [facility] Follow up on 04/12/2018.   Why:  Hospital follow-up on Tuesday, 04/12/18 at 10:00AM. Please bring the following if you have them: photo ID and hospital discharge paperwork. Thank you.  Contact information: [address] [place] [id number] [phone]           Next level of care provider has [facility] Link:no  Safety Planning and Suicide Prevention discussed: Yes,  SPE completed with pt; pt declined to consent to collateral contact.   Have you used any form of tobacco in the last 30 days? (Cigarettes, Smokeless Tobacco, Cigars, and/or Pipes): Yes  Has patient been referred to the Quitline?: Patient refused referral  Patient has been referred for addiction treatment: Yes  Rona RavensHeather S Xiao Graul, LCSW 04/08/2018, 9:19 AM

## 2018-04-08 NOTE — Plan of Care (Signed)
  Problem: Education: Goal: Knowledge of [facility] Education information/materials will improve Outcome: Progressing Goal: Emotional status will improve Outcome: Progressing   

## 2018-04-08 NOTE — Progress Notes (Signed)
Recreation Therapy Notes  Date: 9.20.19 Time: 0930 Location: [address] Dayroom  Group Topic: Stress Management  Goal Area(s) Addresses:  Patient will verbalize importance of using healthy stress management.  Patient will identify positive emotions associated with healthy stress management.   Behavioral Response: Engaged  Intervention: Stress Management  Activity : Progressive Muscle Relaxation.  LRT introduced the stress management technique of progressive muscle relaxation.  LRT read a script and lead patients through a series of techniques that guided them to tense each muscle group individually and then relax them.  Patients were to listen as script was read to engage in activity.  Education:  Stress Management, Discharge Planning.   Education Outcome: Acknowledges edcuation/In group clarification offered/Needs additional education  Clinical Observations/Feedback: Pt attended and participated in activity.    Caroll RancherMarjette Mable Dara, LRT/CTRS         Caroll RancherLindsay, Gracee Ratterree A 04/08/2018 12:18 PM

## 2018-10-03 ENCOUNTER — Emergency Department (HOSPITAL_COMMUNITY)
Admission: EM | Admit: 2018-10-03 | Discharge: 2018-10-03 | Disposition: A | Discharge status: Home / Self Care | Payer: Self-pay | Attending: Emergency Medicine | Admitting: Emergency Medicine

## 2018-10-03 ENCOUNTER — Encounter (HOSPITAL_COMMUNITY): Payer: Self-pay | Admitting: Emergency Medicine

## 2018-10-03 ENCOUNTER — Other Ambulatory Visit: Payer: Self-pay

## 2018-10-03 DIAGNOSIS — N5089 Other specified disorders of the male genital organs: Secondary | ICD-10-CM | POA: Insufficient documentation

## 2018-10-03 DIAGNOSIS — F17210 Nicotine dependence, cigarettes, uncomplicated: Secondary | ICD-10-CM | POA: Insufficient documentation

## 2018-10-03 DIAGNOSIS — Z79899 Other long term (current) drug therapy: Secondary | ICD-10-CM | POA: Insufficient documentation

## 2018-10-03 NOTE — Discharge Instructions (Addendum)
Keep the area clean with mild soap and water.  You may apply a small amount of first-aid ointment 1-2 times daily.  You will be notified if any of your culture testing is positive.  Return to the ER for any worsening symptoms such as increasing pain, swelling, redness and fever.

## 2018-10-03 NOTE — ED Provider Notes (Signed)
[facility] EMERGENCY DEPARTMENT Provider Note   CSN: [id number] Arrival date & time: 10/03/18  1501    History   Chief Complaint Chief Complaint  Patient presents with  . Skin Ulcer    penis    HPI [place] A Rosander is a 23 y.o. male.     HPI   [place] A Needles is a 23 y.o. male who presents to the Emergency Department requesting evaluation of a "pimple" to the shaft of his penis.  He states that he woke up this morning and noticed an irritation to his penis with clothing contact.  He noticed a small blister that he states looks like a pimple.  He does endorse recent unprotected intercourse, but denies pain, discharge or burning from his penis.  No swelling or tenderness of his testicles.  He also denies abdominal pain, fever, or dysuria.  He is also requesting a work excuse for today.     Past Medical History:  Diagnosis Date  . Anxiety   . Asthma   . Depressed   . Polysubstance abuse (HCC)   . Seizures (HCC)   . Wears glasses     Patient Active Problem List   Diagnosis Date Noted  . MDD (major depressive disorder), recurrent episode, severe (HCC) 04/05/2018  . MDD (major depressive disorder) 06/22/2017  . 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 Severe episode of recurrent major depressive disorder, without psychotic features (HCC)   . Bipolar disorder, unspecified (HCC) 03/23/2017  . Depression, major, recurrent, in partial remission (HCC) 06/03/2011  . ELBOW SPRAIN, LEFT 09/17/2010    Past Surgical History:  Procedure Laterality Date  . COLON SURGERY    . FRACTURE SURGERY        Home Medications    Prior to Admission medications   Medication Sig Start Date End Date Taking? Authorizing Provider  gabapentin (NEURONTIN) 300 MG capsule Take 1 capsule (300 mg total) by mouth 3 (three) times daily. For mood control 04/08/18   Money, Feliz Beam B, FNP  hydrOXYzine (ATARAX/VISTARIL) 50 MG tablet Take 1 tablet (50  mg total) by mouth 3 (three) times daily as needed for anxiety. 04/08/18   Money, Gerlene Burdock, FNP  sertraline (ZOLOFT) 50 MG tablet Take 1 tablet (50 mg total) by mouth daily. For mood control 04/09/18   Money, Gerlene Burdock, FNP  traZODone (DESYREL) 100 MG tablet Take 1 tablet (100 mg total) by mouth at bedtime as needed for sleep. 04/08/18   Money, Gerlene Burdock, FNP    Family History Family History  Problem Relation Age of Onset  . Anxiety disorder Father   . Depression Father   . Anxiety disorder Paternal Grandfather   . Anxiety disorder Mother   . Depression Mother   . Depression Cousin   . Drug abuse Cousin     Social History Social History   Tobacco Use  . Smoking status: Current Every Day Smoker    Packs/day: 1.00    Types: Cigarettes  . Smokeless tobacco: Never Used  Substance Use Topics  . Alcohol use: Yes    Frequency: Never    Comment: one beer a week  . Drug use: Yes    Frequency: 7.0 times per week    Types: Marijuana    Comment: couple blunts per day     Allergies   Promethazine hcl   Review of Systems Review of Systems  Constitutional: Negative for activity change, appetite change, chills and fever.  Respiratory: Negative for  chest tightness and shortness of breath.   Gastrointestinal: Negative for abdominal pain, nausea and vomiting.  Genitourinary: Positive for genital sores. Negative for decreased urine volume, difficulty urinating, discharge, dysuria, flank pain, frequency, hematuria, penile pain, penile swelling, scrotal swelling, testicular pain and urgency.  Musculoskeletal: Negative for back pain.  Skin: Negative for rash.  Neurological: Negative for dizziness, weakness and numbness.  Hematological: Negative for adenopathy.  Psychiatric/Behavioral: Negative for confusion.     Physical Exam Updated Vital Signs BP 113/76 (BP Location: Right Arm)   Pulse (!) 103   Temp 98.2 F (36.8 C) (Oral)   Resp 16   Ht  (1.702 m)   Wt 56.7 kg   SpO2 99%    BMI 19.58 kg/m   Physical Exam Vitals signs and nursing note reviewed. Exam conducted with a chaperone present.  Constitutional:      Appearance: Normal appearance. He is not ill-appearing.  HENT:     Head: Atraumatic.  Cardiovascular:     Rate and Rhythm: Normal rate.     Pulses: Normal pulses.  Pulmonary:     Effort: Pulmonary effort is normal.     Breath sounds: Normal breath sounds.  Abdominal:     General: There is no distension.     Palpations: Abdomen is soft.     Tenderness: There is no abdominal tenderness. There is no guarding.  Genitourinary:    Penis: Circumcised.      Scrotum/Testes: Normal. Cremasteric reflex is present.        Right: Mass, tenderness or swelling not present.        Left: Mass, tenderness or swelling not present.     Comments: Single, 1-2 mm pustule present without surrounding erythema, edema or induration.  Pustule was de-roofed and some purulent material was expressed.   Musculoskeletal: Normal range of motion.  Lymphadenopathy:     Lower Body: No right inguinal adenopathy. No left inguinal adenopathy.  Skin:    General: Skin is warm.     Findings: No rash.  Neurological:     General: No focal deficit present.     Mental Status: He is alert.      ED Treatments / Results  Labs (all labs ordered are listed, but only abnormal results are displayed) Labs Reviewed  AEROBIC CULTURE (SUPERFICIAL SPECIMEN)  GC/CHLAMYDIA PROBE AMP ([place]) NOT AT [facility]    EKG None  Radiology No results found.  Procedures Procedures (including critical care time)  Medications Ordered in ED Medications - No data to display   Initial Impression / Assessment and Plan / ED Course  I have reviewed the triage vital signs and the nursing notes.  Pertinent labs & imaging results that were available during my care of the patient were reviewed by me and considered in my medical decision making (see chart for details).        Patient  well-appearing.  He has a single, small pustule to the shaft of his penis that was de-roofed by me while obtaining his wound culture.  No concerning symptoms for abscess or ulceration.  Patient does not have any urethral discharge, testicular pain or swelling, on exam.  GC and chlamydia cultures pending.   Prophylactic medications offered, but pt prefers to wait until culture results are back, I feel this is reasonable given his lack of urethral d/c other symptoms.    Final Clinical Impressions(s) / ED Diagnoses   Final diagnoses:  Genital lesion, male    ED Discharge Orders  None       Pauline Aus, PA-C 10/03/18 1650    Donnetta Hutching, MD 10/03/18 2056

## 2018-10-03 NOTE — ED Triage Notes (Signed)
Patient c/o ulcer to his penis. Denies discharge, hematuria, or difficulty with urination. Has had recent unprotected sex.

## 2018-10-04 LAB — GC/CHLAMYDIA PROBE AMP (~~LOC~~) NOT AT ARMC
CHLAMYDIA, DNA PROBE: NEGATIVE
NEISSERIA GONORRHEA: NEGATIVE

## 2018-10-05 LAB — AEROBIC CULTURE W GRAM STAIN (SUPERFICIAL SPECIMEN)

## 2018-10-05 LAB — AEROBIC CULTURE  (SUPERFICIAL SPECIMEN)
CULTURE: NORMAL
SPECIAL REQUESTS: NORMAL

## 2018-10-06 ENCOUNTER — Telehealth: Payer: Self-pay | Admitting: *Deleted

## 2018-10-06 NOTE — Telephone Encounter (Signed)
Post ED Visit - Positive Culture Follow-up  Culture report reviewed by antimicrobial stewardship pharmacist: [facility] []  Enzo Bi, Pharm.D. []  Celedonio Miyamoto, Pharm.D., BCPS AQ-ID []  Garvin Fila, Pharm.D., BCPS []  Georgina Pillion, Pharm.D., BCPS []  [place]D., BCPS, AAHIVP []  Estella Husk, Pharm.D., BCPS, AAHIVP []  Lysle Pearl, PharmD, BCPS []  Phillips Climes, PharmD, BCPS []  Agapito Games, PharmD, BCPS []  Verlan Friends, PharmD [x]  Mervyn Gay, PharmD, BCPS []  Vinnie Level, PharmD  [facility] []  Len Childs, PharmD []  Greer Pickerel, PharmD []  Adalberto Cole, PharmD []  Perlie Gold, Rph []  Lonell Face) Jean Rosenthal, PharmD []  Earl Many, PharmD []  Junita Push, PharmD []  Dorna Leitz, PharmD []  Terrilee Files, PharmD []  Lynann Beaver, PharmD []  Keturah Barre, PharmD []  Loralee Pacas, PharmD []  Bernadene Person, PharmD   Positive wound culture No further patient follow-up is required at this time.  Virl [facility] 10/06/2018, 11:56 AM

## 2018-10-24 IMAGING — DX DG KNEE COMPLETE 4+V*L*
4 series · 4 of 4 positions shown · non-contrast
Comparison: None.

CLINICAL DATA: Bilateral knee pain. Patient reports remote
fracture. No recent injury.

EXAM:
LEFT KNEE - COMPLETE 4+ VIEW

[knee ap]
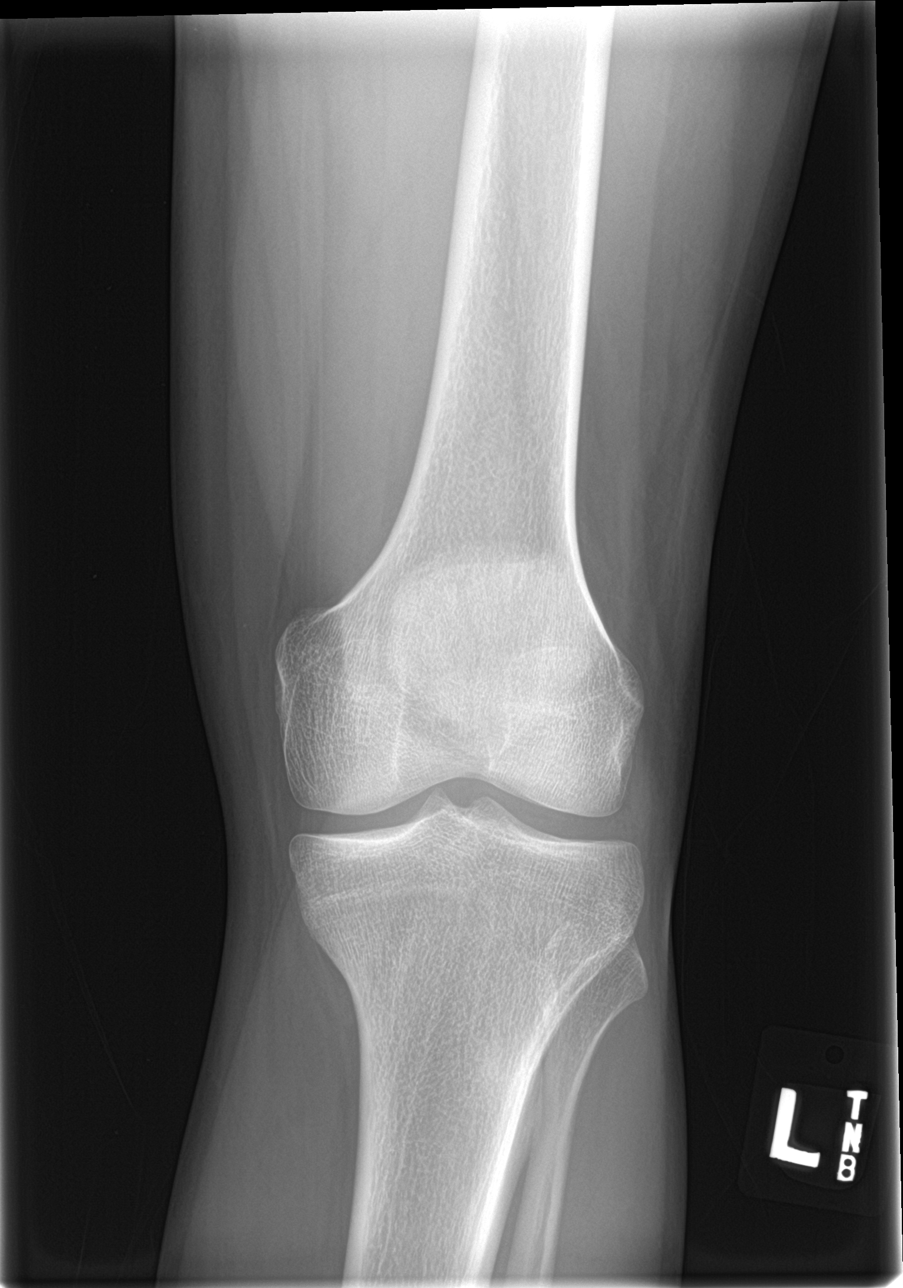

[tunnel]
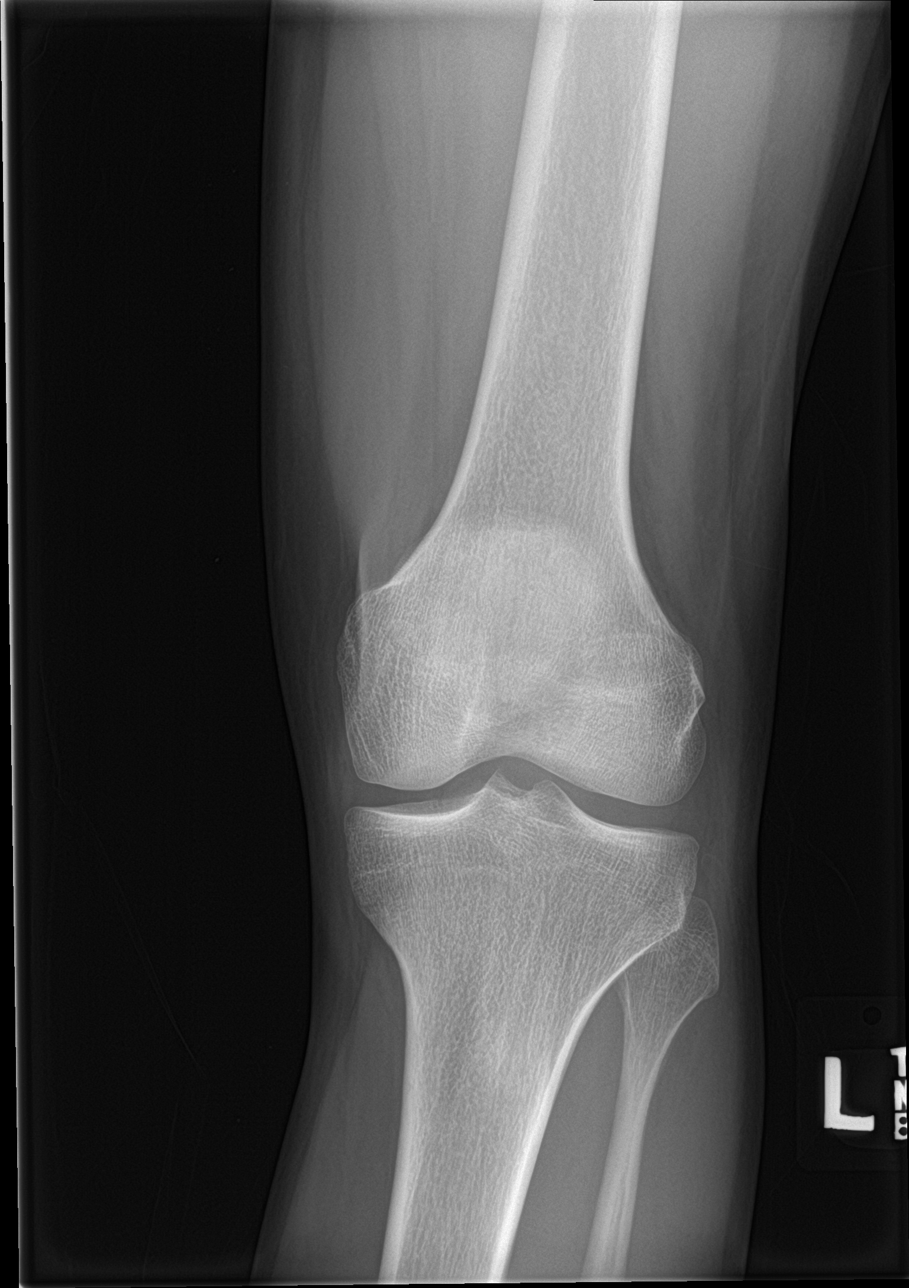

[knee lat]
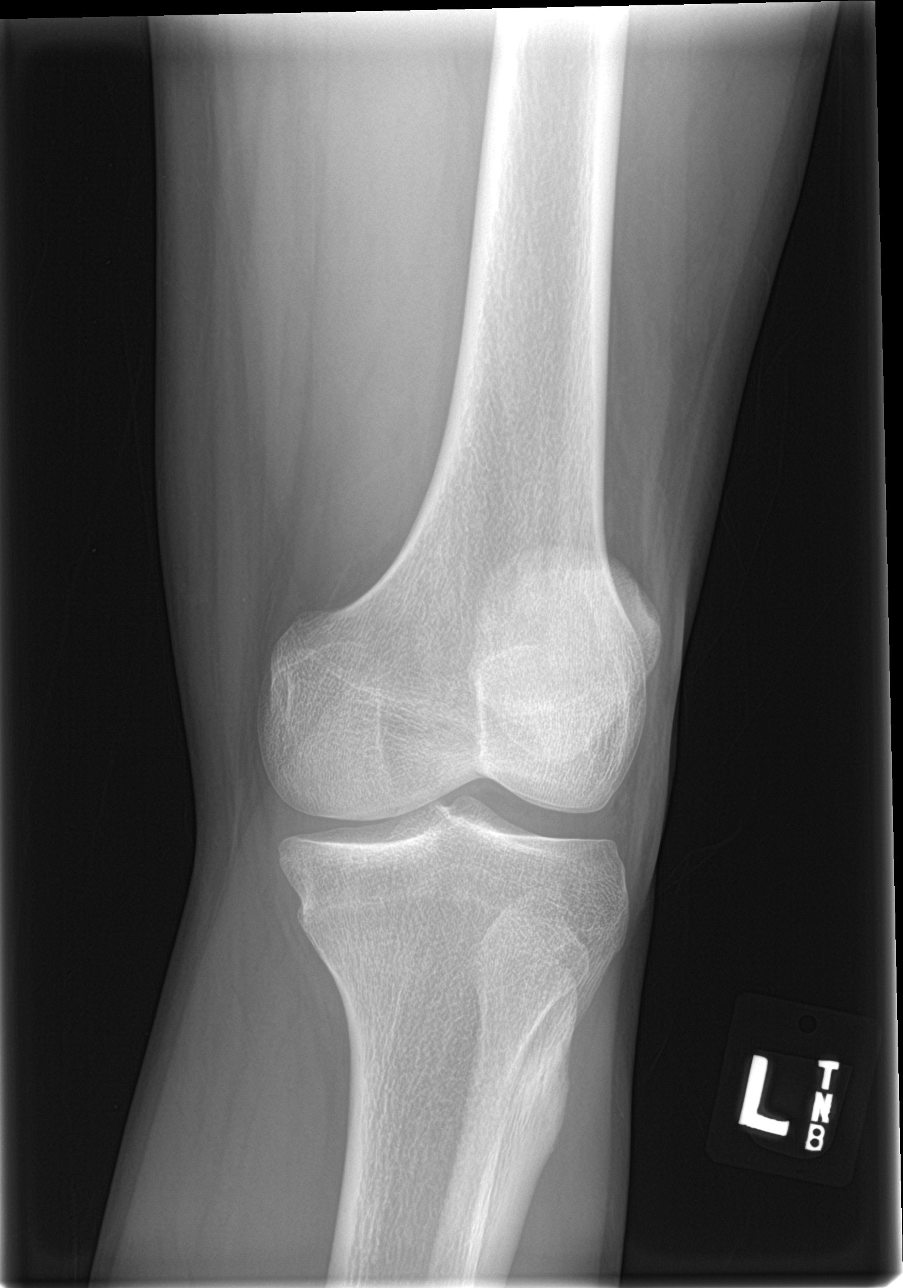

[knee sunrise]
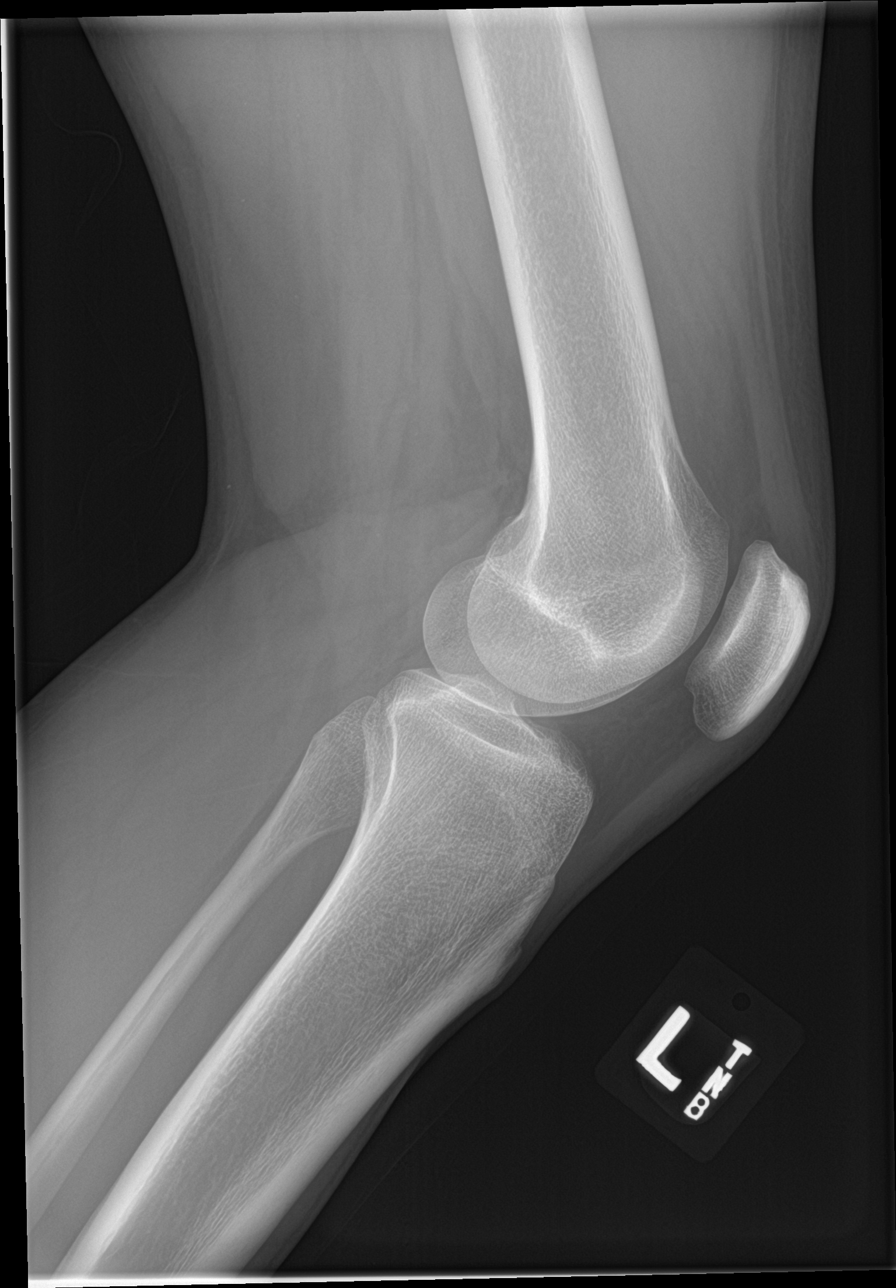

[4 of 4 positions shown; findings below may reference images not displayed]

FINDINGS: No evidence of acute or healed fracture. Normal joint spaces and
alignment. No joint effusion. No evidence of arthropathy or other
focal bone abnormality. Soft tissues are unremarkable.
IMPRESSION: Negative radiographs of the left knee.

## 2018-10-24 IMAGING — DX DG KNEE COMPLETE 4+V*R*
4 series · 4 of 4 positions shown · non-contrast
Comparison: None.

CLINICAL DATA: Bilateral knee pain. Patient reports remote
fracture. No recent injury.

EXAM:
RIGHT KNEE - COMPLETE 4+ VIEW

[knee ap]
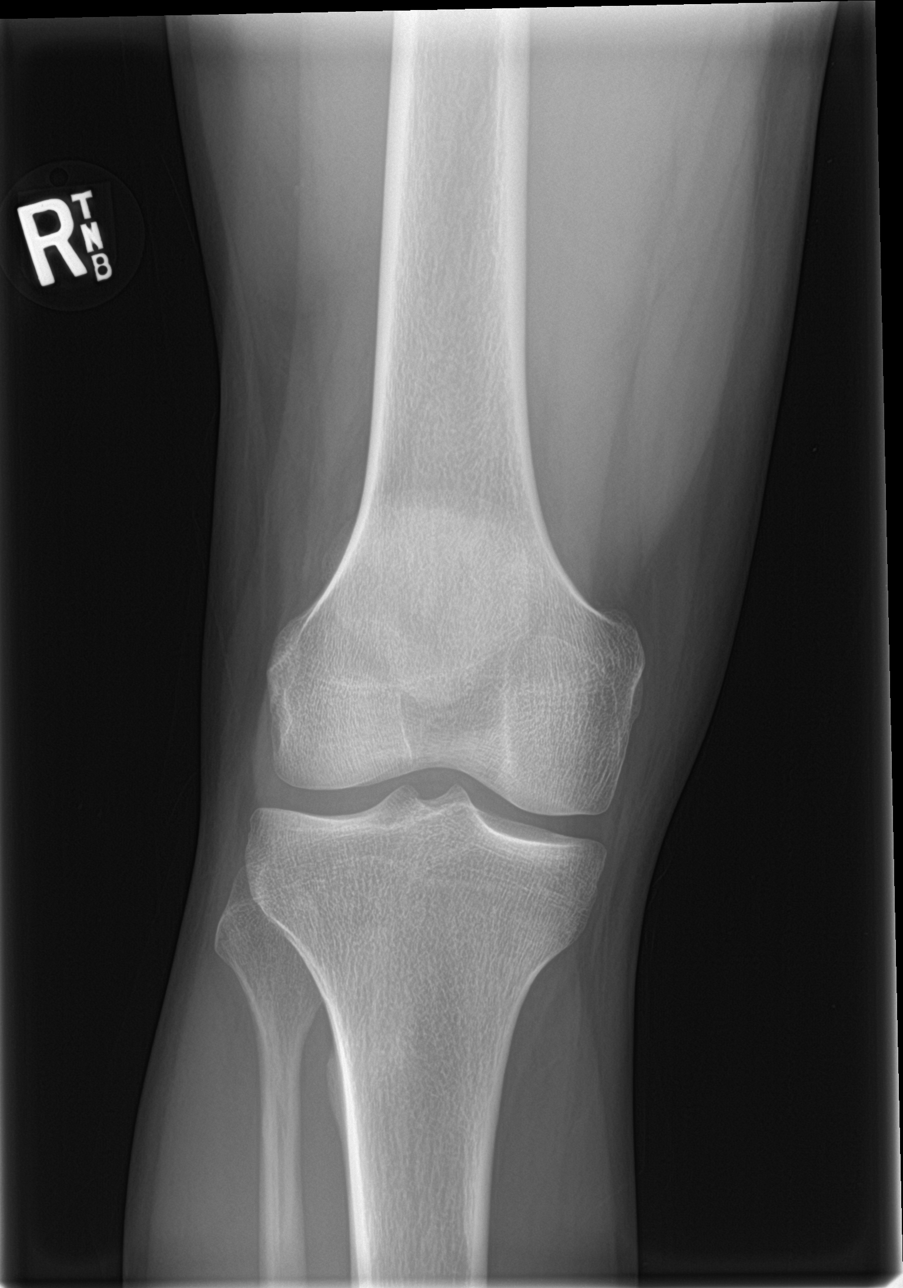

[tunnel]
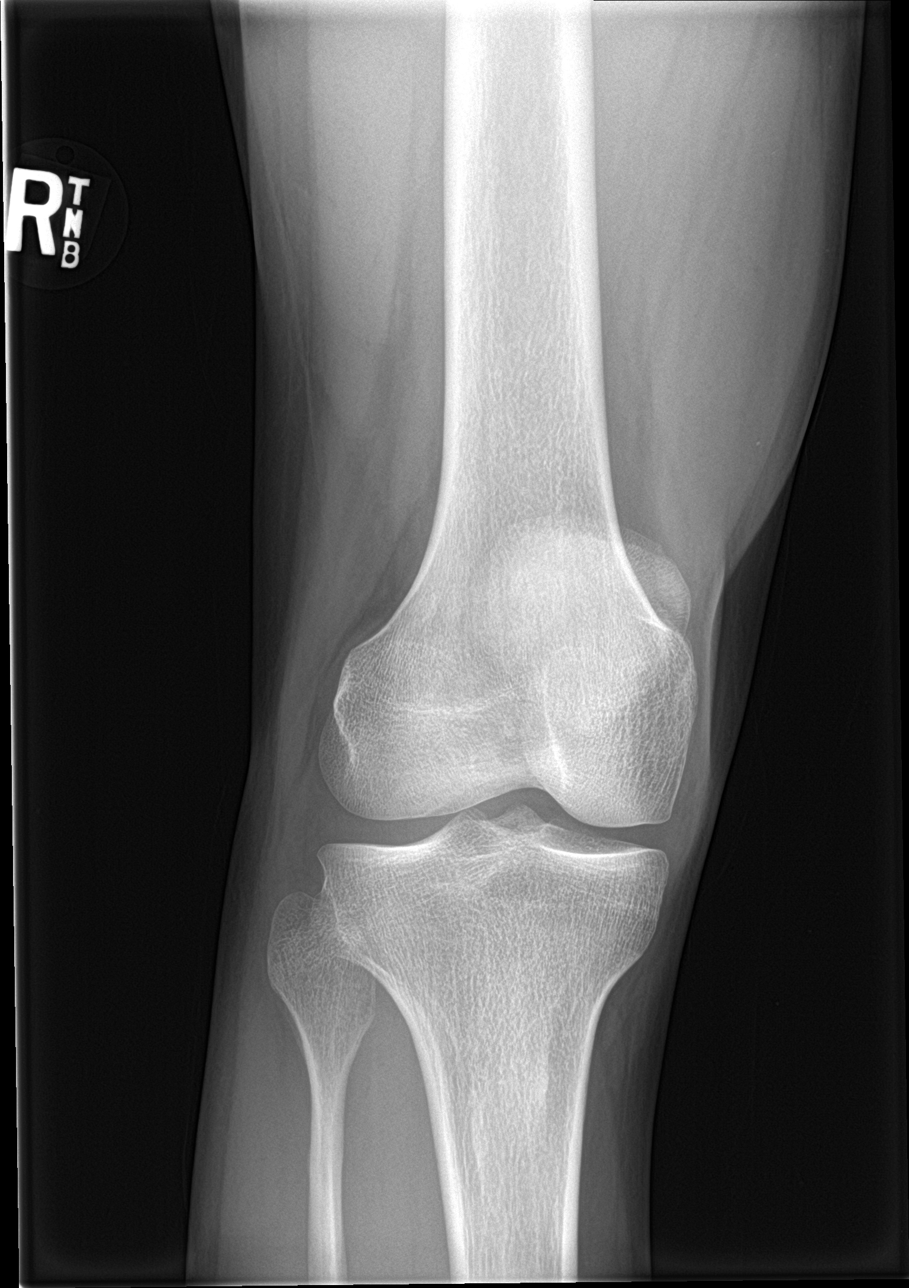

[knee lat]
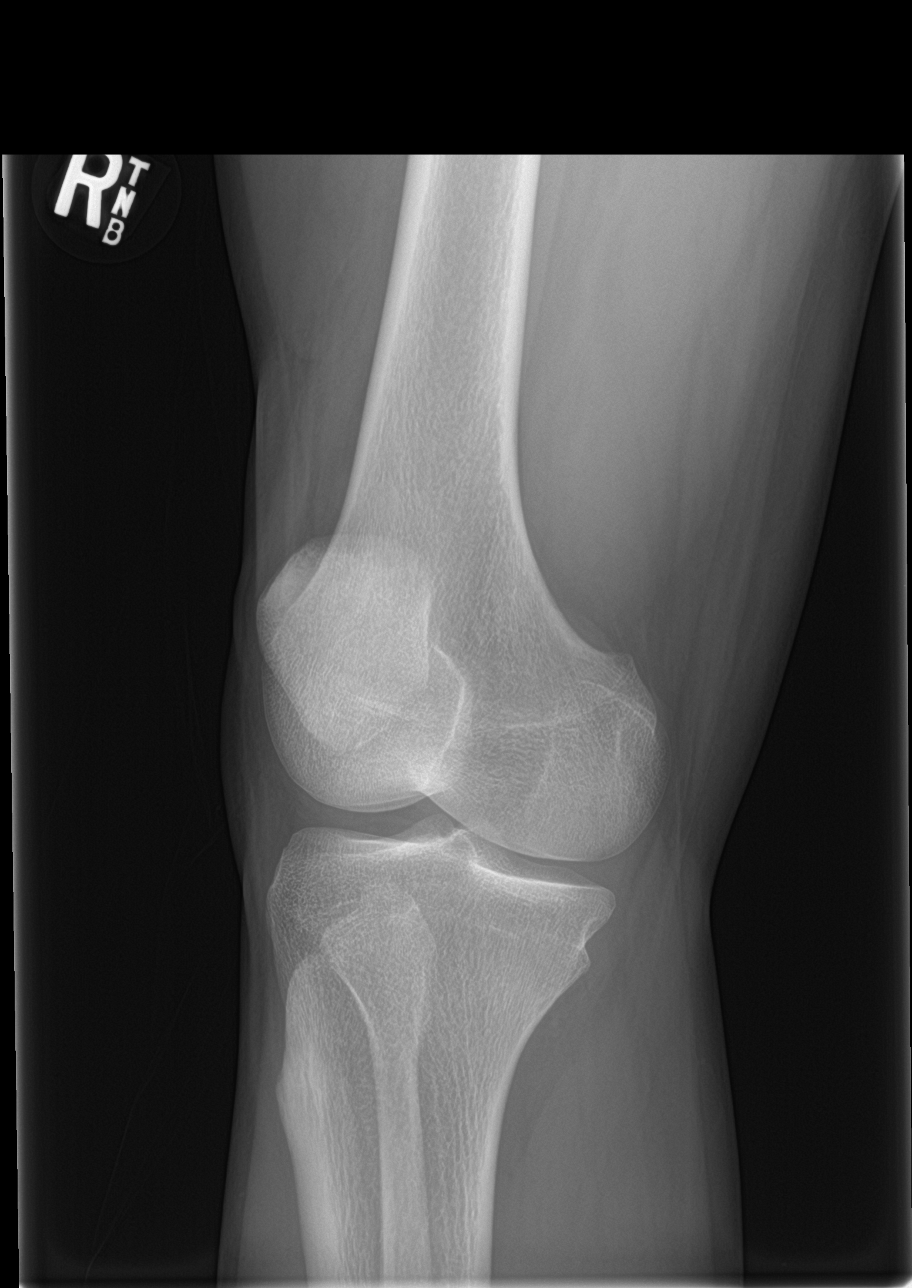

[knee sunrise]
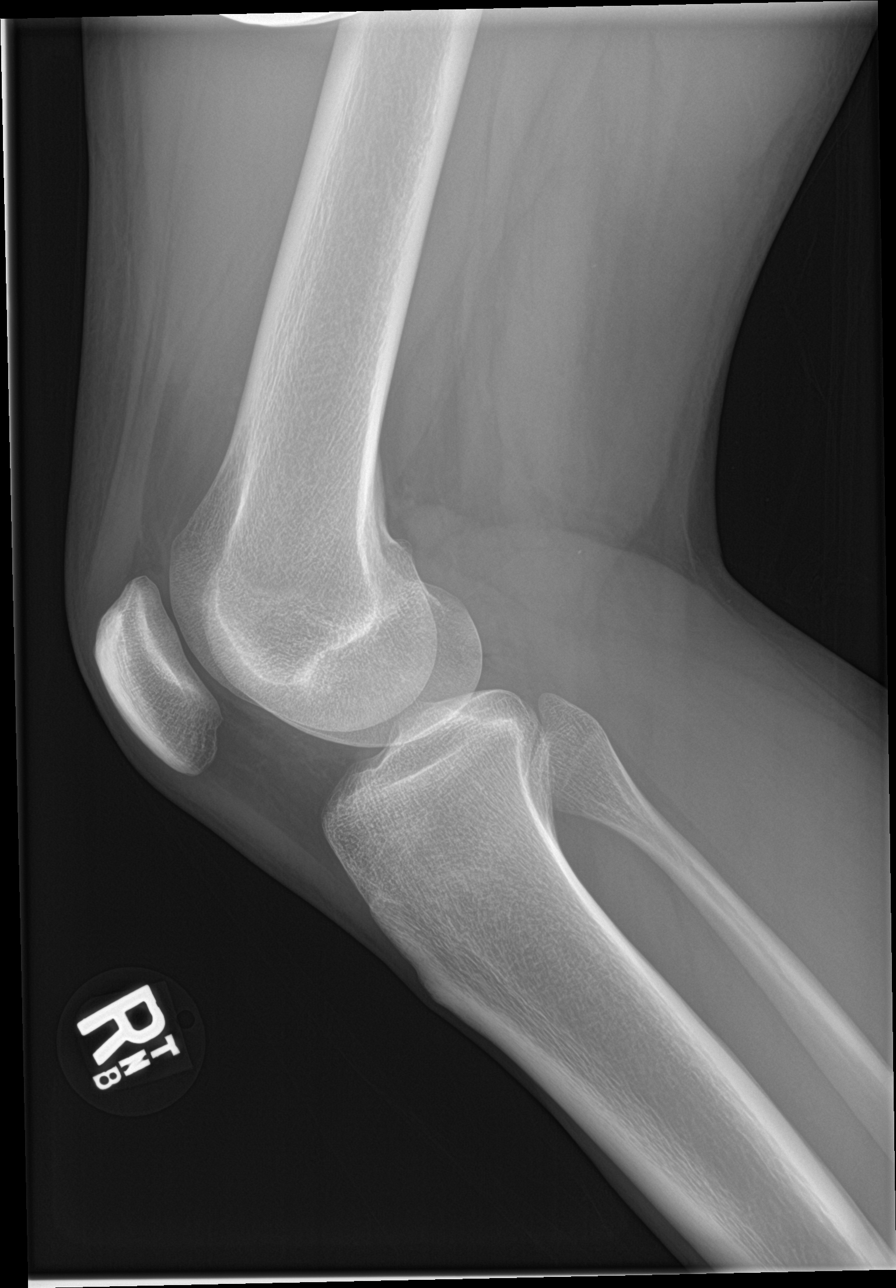

[4 of 4 positions shown; findings below may reference images not displayed]

FINDINGS: No evidence of acute or remote fracture. Normal joint spaces and
alignment. No joint effusion. No evidence of arthropathy or other
focal bone abnormality. Soft tissues are unremarkable.
IMPRESSION: Negative radiographs of the right knee.

## 2019-02-08 ENCOUNTER — Other Ambulatory Visit: Payer: Self-pay

## 2019-02-08 ENCOUNTER — Encounter (HOSPITAL_COMMUNITY): Payer: Self-pay | Admitting: Emergency Medicine

## 2019-02-08 ENCOUNTER — Emergency Department (HOSPITAL_COMMUNITY)
Admission: EM | Admit: 2019-02-08 | Discharge: 2019-02-08 | Disposition: A | Discharge status: Home / Self Care | Payer: Self-pay | Attending: Emergency Medicine | Admitting: Emergency Medicine

## 2019-02-08 DIAGNOSIS — J45909 Unspecified asthma, uncomplicated: Secondary | ICD-10-CM | POA: Insufficient documentation

## 2019-02-08 DIAGNOSIS — K4090 Unilateral inguinal hernia, without obstruction or gangrene, not specified as recurrent: Secondary | ICD-10-CM | POA: Insufficient documentation

## 2019-02-08 DIAGNOSIS — F17210 Nicotine dependence, cigarettes, uncomplicated: Secondary | ICD-10-CM | POA: Insufficient documentation

## 2019-02-08 DIAGNOSIS — Z79899 Other long term (current) drug therapy: Secondary | ICD-10-CM | POA: Insufficient documentation

## 2019-02-08 MED ORDER — HYDROCODONE-ACETAMINOPHEN 5-325 MG PO TABS: ORAL_TABLET | ORAL | 0 refills | Status: DC

## 2019-02-08 NOTE — Discharge Instructions (Addendum)
Call Dr. Ardis Hughs office and set up an appointment either this week or next week for evaluation of hernia

## 2019-02-08 NOTE — ED Provider Notes (Signed)
[facility] EMERGENCY DEPARTMENT Provider Note   CSN: [id number] Arrival date & time: 02/08/19  3086     History   Chief Complaint Chief Complaint  Patient presents with  . Groin Pain    HPI Marc [name] is a 23 y.o. male.     Patient complains of left groin pain.  He also has noticed a small bulge there  The history is provided by the patient. No language interpreter was used.  Groin Pain This is a new problem. The current episode started 2 days ago. The problem occurs constantly. The problem has not changed since onset.Pertinent negatives include no chest pain, no abdominal pain and no headaches. Nothing aggravates the symptoms. Nothing relieves the symptoms.    Past Medical History:  Diagnosis Date  . Anxiety   . Asthma   . Depressed   . Polysubstance abuse ([place])   . Seizures ([place])   . Wears glasses     Patient Active Problem List   Diagnosis Date Noted  . MDD (major depressive disorder), recurrent episode, severe ([place]) 04/05/2018  . MDD (major depressive disorder) 06/22/2017  . Major depressive disorder, recurrent severe without psychotic features ([place]) 06/21/2017  . Severe episode of recurrent major depressive disorder, without psychotic features ([place])   . Bipolar disorder, unspecified ([place]) 03/23/2017  . Depression, major, recurrent, in partial remission ([place]) 06/03/2011  . ELBOW SPRAIN, LEFT 09/17/2010    Past Surgical History:  Procedure Laterality Date  . COLON SURGERY    . FRACTURE SURGERY          Home Medications    Prior to Admission medications   Medication Sig Start Date End Date Taking? Authorizing Provider  gabapentin (NEURONTIN) 300 MG capsule Take 1 capsule (300 mg total) by mouth 3 (three) times daily. For mood control 04/08/18   Money, Lowry Ram, FNP  HYDROcodone-acetaminophen (NORCO/VICODIN) 5-325 MG tablet Take 1 every 6 hours for pain that is not relieved by Tylenol alone 02/08/19   Milton Ferguson, MD  hydrOXYzine  (ATARAX/VISTARIL) 50 MG tablet Take 1 tablet (50 mg total) by mouth 3 (three) times daily as needed for anxiety. 04/08/18   Money, Lowry Ram, FNP  sertraline (ZOLOFT) 50 MG tablet Take 1 tablet (50 mg total) by mouth daily. For mood control 04/09/18   Money, Lowry Ram, FNP  traZODone (DESYREL) 100 MG tablet Take 1 tablet (100 mg total) by mouth at bedtime as needed for sleep. 04/08/18   Money, Lowry Ram, FNP    Family History Family History  Problem Relation Age of Onset  . Anxiety disorder Father   . Depression Father   . Anxiety disorder Paternal Grandfather   . Anxiety disorder Mother   . Depression Mother   . Depression Cousin   . Drug abuse Cousin     Social History Social History   Tobacco Use  . Smoking status: Current Every Day Smoker    Packs/day: 1.00    Types: Cigarettes  . Smokeless tobacco: Never Used  Substance Use Topics  . Alcohol use: Yes    Frequency: Never    Comment: one beer a week  . Drug use: Yes    Frequency: 7.0 times per week    Types: Marijuana    Comment: couple blunts per day     Allergies   Promethazine hcl   Review of Systems Review of Systems  Constitutional: Negative for appetite change and fatigue.  HENT: Negative for congestion, ear discharge and sinus pressure.   Eyes:  Negative for discharge.  Respiratory: Negative for cough.   Cardiovascular: Negative for chest pain.  Gastrointestinal: Negative for abdominal pain and diarrhea.       Left groin pain  Genitourinary: Negative for frequency and hematuria.  Musculoskeletal: Negative for back pain.  Skin: Negative for rash.  Neurological: Negative for seizures and headaches.  Psychiatric/Behavioral: Negative for hallucinations.     Physical Exam Updated Vital Signs BP 116/85 (BP Location: Left Arm)   Pulse 84   Temp 98 F (36.7 C) (Oral)   Resp 16   Ht 5\' 5"  (1.651 m)   Wt 56.7 kg   SpO2 99%   BMI 20.80 kg/m   Physical Exam Vitals signs and nursing note reviewed.   Constitutional:      Appearance: He is well-developed.  HENT:     Head: Normocephalic.     Nose: Nose normal.  Eyes:     General: No scleral icterus.    Conjunctiva/sclera: Conjunctivae normal.  Neck:     Musculoskeletal: Neck supple.     Thyroid: No thyromegaly.  Cardiovascular:     Rate and Rhythm: Normal rate and regular rhythm.     Heart sounds: No murmur. No friction rub. No gallop.   Pulmonary:     Breath sounds: No stridor. No wheezing or rales.  Chest:     Chest wall: No tenderness.  Abdominal:     General: There is no distension.     Tenderness: There is no abdominal tenderness. There is no rebound.  Genitourinary:    Comments: Small left inguinal hernia nonincarcerated. Musculoskeletal: Normal range of motion.  Lymphadenopathy:     Cervical: No cervical adenopathy.  Skin:    Findings: No erythema or rash.  Neurological:     Mental Status: He is oriented to person, place, and time.     Motor: No abnormal muscle tone.     Coordination: Coordination normal.  Psychiatric:        Behavior: Behavior normal.      ED Treatments / Results  Labs (all labs ordered are listed, but only abnormal results are displayed) Labs Reviewed - No data to display  EKG None  Radiology No results found.  Procedures Procedures (including critical care time)  Medications Ordered in ED Medications - No data to display   Initial Impression / Assessment and Plan / ED Course  I have reviewed the triage vital signs and the nursing notes.  Pertinent labs & imaging results that were available during my care of the patient were reviewed by me and considered in my medical decision making (see chart for details).        Patient with left inguinal hernia.  He is given Vicodin for pain and is told to call general surgery and set up appointment.  He is also told if he has severe pain or persistent vomiting that he should return to the emergency department  Final Clinical  Impressions(s) / ED Diagnoses   Final diagnoses:  Inguinal hernia of left side without obstruction or gangrene    ED Discharge Orders         Ordered    HYDROcodone-acetaminophen (NORCO/VICODIN) 5-325 MG tablet     02/08/19 0758           Bethann BerkshireZammit, Seraphina Mitchner, MD 02/08/19 0800

## 2019-02-08 NOTE — ED Triage Notes (Signed)
Pt c/o of groin pain x2 days off and on. Started at work. Says pain was so bad last night at work he had to leave work early.

## 2019-03-07 ENCOUNTER — Other Ambulatory Visit: Payer: Self-pay

## 2019-03-07 ENCOUNTER — Ambulatory Visit (INDEPENDENT_AMBULATORY_CARE_PROVIDER_SITE_OTHER): Payer: Self-pay | Admitting: General Surgery

## 2019-03-07 ENCOUNTER — Encounter: Payer: Self-pay | Admitting: General Surgery

## 2019-03-07 VITALS — BP 104/69 | HR 61 | Temp 98.2°F | Resp 16 | Ht 65.0 in | Wt 123.0 lb

## 2019-03-07 DIAGNOSIS — K4090 Unilateral inguinal hernia, without obstruction or gangrene, not specified as recurrent: Secondary | ICD-10-CM

## 2019-03-07 NOTE — Patient Instructions (Signed)
Inguinal Hernia, Adult °An inguinal hernia develops when fat or the intestines push through a weak spot in a muscle where your leg meets your lower abdomen (groin). This creates a bulge. This kind of hernia could also be: °· In your scrotum, if you are male. °· In folds of skin around your vagina, if you are male. °There are three types of inguinal hernias: °· Hernias that can be pushed back into the abdomen (are reducible). This type rarely causes pain. °· Hernias that are not reducible (are incarcerated). °· Hernias that are not reducible and lose their blood supply (are strangulated). This type of hernia requires emergency surgery. °What are the causes? °This condition is caused by having a weak spot in the muscles or tissues in the groin. This weak spot develops over time. The hernia may poke through the weak spot when you suddenly strain your lower abdominal muscles, such as when you: °· Lift a heavy object. °· Strain to have a bowel movement. Constipation can lead to straining. °· Cough. °What increases the risk? °This condition is more likely to develop in: °· Men. °· Pregnant women. °· People who: °? Are overweight. °? Work in jobs that require long periods of standing or heavy lifting. °? Have had an inguinal hernia before. °? Smoke or have lung disease. These factors can lead to long-lasting (chronic) coughing. °What are the signs or symptoms? °Symptoms may depend on the size of the hernia. Often, a small inguinal hernia has no symptoms. Symptoms of a larger hernia may include: °· A lump in the groin area. This is easier to see when standing. It might not be visible when lying down. °· Pain or burning in the groin. This may get worse when lifting, straining, or coughing. °· A dull ache or a feeling of pressure in the groin. °· In men, an unusual lump in the scrotum. °Symptoms of a strangulated inguinal hernia may include: °· A bulge in your groin that is very painful and tender to the touch. °· A bulge  that turns red or purple. °· Fever, nausea, and vomiting. °· Inability to have a bowel movement or to pass gas. °How is this diagnosed? °This condition is diagnosed based on your symptoms, your medical history, and a physical exam. Your health care provider may feel your groin area and ask you to cough. °How is this treated? °Treatment depends on the size of your hernia and whether you have symptoms. If you do not have symptoms, your health care provider may have you watch your hernia carefully and have you come in for follow-up visits. If your hernia is large or if you have symptoms, you may need surgery to repair the hernia. °Follow these instructions at home: °Lifestyle °· Avoid lifting heavy objects. °· Avoid standing for long periods of time. °· Do not use any products that contain nicotine or tobacco, such as cigarettes and e-cigarettes. If you need help quitting, ask your health care provider. °· Maintain a healthy weight. °Preventing constipation °· Take actions to prevent constipation. Constipation leads to straining with bowel movements, which can make a hernia worse or cause a hernia repair to break down. Your health care provider may recommend that you: °? Drink enough fluid to keep your urine pale yellow. °? Eat foods that are high in fiber, such as fresh fruits and vegetables, whole grains, and beans. °? Limit foods that are high in fat and processed sugars, such as fried or sweet foods. °? Take an over-the-counter   or prescription medicine for constipation. °General instructions °· You may try to push the hernia back in place by very gently pressing on it while lying down. Do not try to force the bulge back in if it will not push in easily. °· Watch your hernia for any changes in shape, size, or color. Get help right away if you notice any changes. °· Take over-the-counter and prescription medicines only as told by your health care provider. °· Keep all follow-up visits as told by your health care  provider. This is important. °Contact a health care provider if: °· You have a fever. °· You develop new symptoms. °· Your symptoms get worse. °Get help right away if: °· You have pain in your groin that suddenly gets worse. °· You have a bulge in your groin that: °? Suddenly gets bigger and does not get smaller. °? Becomes red or purple or painful to the touch. °· You are a man and you have a sudden pain in your scrotum, or the size of your scrotum suddenly changes. °· You cannot push the hernia back in place by very gently pressing on it when you are lying down. Do not try to force the bulge back in if it will not push in easily. °· You have nausea or vomiting that does not go away. °· You have a fast heartbeat. °· You cannot have a bowel movement or pass gas. °These symptoms may represent a serious problem that is an emergency. Do not wait to see if the symptoms will go away. Get medical help right away. Call your local emergency services (911 in the [place] °Summary °· An inguinal hernia develops when fat or the intestines push through a weak spot in a muscle where your leg meets your lower abdomen (groin). °· This condition is caused by having a weak spot in muscles or tissue in your groin. °· Symptoms may depend on the size of the hernia, and they may include pain or swelling in your groin. A small inguinal hernia often has no symptoms. °· Treatment may not be needed if you do not have symptoms. If you have symptoms or a large hernia, you may need surgery to repair the hernia. °· Avoid lifting heavy objects. Also avoid standing for long amounts of time. °This information is not intended to replace advice given to you by your health care provider. Make sure you discuss any questions you have with your health care provider. °Document Released: 11/22/2008 Document Revised: 08/07/2017 Document Reviewed: 04/07/2017 °Elsevier Patient Education © 2020 [employer] ° °

## 2019-03-07 NOTE — Progress Notes (Signed)
Marc [name]; [id number]; 11/07/1995   HPI 23 year old white male who was referred to my care by the emergency room for evaluation treatment of a left inguinal hernia.  He states he noticed the left inguinal hernia several months ago.  Is made worse with straining.  Occasionally a lump is noted in the left groin region.  The pain does extend down to the left testicle.  He currently has no pain.  He denies any nausea or vomiting.  He takes ibuprofen to control any of the pain that he may have.  He was seen in the emergency room on 02/08/2019 and diagnosed with a left inguinal hernia. Past Medical History:  Diagnosis Date  . Anxiety   . Asthma   . Depressed   . Polysubstance abuse ([place])   . Seizures ([place])   . Wears glasses     Past Surgical History:  Procedure Laterality Date  . COLON SURGERY    . FRACTURE SURGERY      Family History  Problem Relation Age of Onset  . Anxiety disorder Father   . Depression Father   . Anxiety disorder Paternal Grandfather   . Anxiety disorder Mother   . Depression Mother   . Depression Cousin   . Drug abuse Cousin     Current Outpatient Medications on File Prior to Visit  Medication Sig Dispense Refill  . gabapentin (NEURONTIN) 300 MG capsule Take 1 capsule (300 mg total) by mouth 3 (three) times daily. For mood control (Patient not taking: Reported on 03/07/2019) 90 capsule 0  . HYDROcodone-acetaminophen (NORCO/VICODIN) 5-325 MG tablet Take 1 every 6 hours for pain that is not relieved by Tylenol alone (Patient not taking: Reported on 03/07/2019) 20 tablet 0  . hydrOXYzine (ATARAX/VISTARIL) 50 MG tablet Take 1 tablet (50 mg total) by mouth 3 (three) times daily as needed for anxiety. (Patient not taking: Reported on 03/07/2019) 30 tablet 0  . sertraline (ZOLOFT) 50 MG tablet Take 1 tablet (50 mg total) by mouth daily. For mood control (Patient not taking: Reported on 03/07/2019) 30 tablet 0  . traZODone (DESYREL) 100 MG tablet Take 1 tablet (100 mg  total) by mouth at bedtime as needed for sleep. (Patient not taking: Reported on 03/07/2019) 30 tablet 0   No current facility-administered medications on file prior to visit.     Allergies  Allergen Reactions  . Promethazine Hcl Other (See Comments)    Seizures     Social History   Substance and Sexual Activity  Alcohol Use Yes  . Frequency: Never   Comment: one beer a week    Social History   Tobacco Use  Smoking Status Current Every Day Smoker  . Packs/day: 1.00  . Types: Cigarettes  Smokeless Tobacco Never Used    Review of Systems  Constitutional: Positive for malaise/fatigue.  HENT: Negative.   Eyes: Negative.   Cardiovascular: Negative.   Gastrointestinal: Positive for abdominal pain.  Genitourinary: Positive for dysuria.  Musculoskeletal: Positive for back pain and joint pain.  Skin: Negative.   Neurological: Negative.   Endo/Heme/Allergies: Negative.   Psychiatric/Behavioral: Negative.     Objective   Vitals:   03/07/19 1035  BP: 104/69  Pulse: 61  Resp: 16  Temp: 98.2 F (36.8 C)  SpO2: 97%    Physical Exam Vitals signs reviewed.  Constitutional:      Appearance: Normal appearance. He is normal weight. He is not ill-appearing.  HENT:     Head: Normocephalic and atraumatic.  Cardiovascular:  Rate and Rhythm: Normal rate and regular rhythm.     Heart sounds: Normal heart sounds. No murmur. No friction rub. No gallop.   Pulmonary:     Effort: Pulmonary effort is normal. No respiratory distress.     Breath sounds: Normal breath sounds. No stridor. No wheezing, rhonchi or rales.  Abdominal:     General: Abdomen is flat. Bowel sounds are normal. There is no distension.     Palpations: Abdomen is soft. There is no mass.     Tenderness: There is no abdominal tenderness. There is no guarding or rebound.     Hernia: A hernia is present.     Comments: Small left inguinal hernia appreciated while patient was standing.  Weak right inguinal floor.   No hernia appreciated on the right.  Genitourinary:    Comments: Genitourinary examination is within normal limits. Skin:    General: Skin is warm and dry.  Neurological:     Mental Status: He is alert and oriented to person, place, and time.   ER notes reviewed  Assessment  Left inguinal hernia Plan   Patient would like to delay repair of his left inguinal hernia until later this year.  That is fine with me.  Literature given concerning signs and symptoms of incarceration.  The risks and benefits of the procedure including bleeding, infection, mesh use, the possibility of recurrence of the hernia were fully explained to the patient, who gave informed consent.  He will call to schedule the surgery.

## 2019-05-10 ENCOUNTER — Other Ambulatory Visit: Payer: Self-pay

## 2019-05-10 ENCOUNTER — Encounter (HOSPITAL_COMMUNITY): Payer: Self-pay | Admitting: *Deleted

## 2019-05-10 ENCOUNTER — Emergency Department (HOSPITAL_COMMUNITY)
Admission: EM | Admit: 2019-05-10 | Discharge: 2019-05-10 | Disposition: A | Discharge status: Home / Self Care | Payer: Self-pay | Attending: Emergency Medicine | Admitting: Emergency Medicine

## 2019-05-10 DIAGNOSIS — F17210 Nicotine dependence, cigarettes, uncomplicated: Secondary | ICD-10-CM | POA: Insufficient documentation

## 2019-05-10 DIAGNOSIS — J069 Acute upper respiratory infection, unspecified: Secondary | ICD-10-CM | POA: Insufficient documentation

## 2019-05-10 DIAGNOSIS — J45909 Unspecified asthma, uncomplicated: Secondary | ICD-10-CM | POA: Insufficient documentation

## 2019-05-10 MED ORDER — PREDNISONE 20 MG PO TABS
40.0000 mg | ORAL_TABLET | Freq: Once | ORAL | Status: AC
  Administered 2019-05-10: 40 mg via ORAL
  Filled 2019-05-10: qty 2

## 2019-05-10 MED ORDER — OXYMETAZOLINE HCL 0.05 % NA SOLN
2.0000 | Freq: Once | NASAL | Status: AC
  Administered 2019-05-10: 15:00:00 2 via NASAL
  Filled 2019-05-10: qty 30

## 2019-05-10 MED ORDER — DEXTROMETHORPHAN-GUAIFENESIN 10-100 MG/5ML PO LIQD: 5.0000 mL | ORAL | 1 refills | Status: DC | PRN

## 2019-05-10 MED ORDER — IBUPROFEN 400 MG PO TABS
400.0000 mg | ORAL_TABLET | Freq: Once | ORAL | Status: AC
  Administered 2019-05-10: 400 mg via ORAL
  Filled 2019-05-10: qty 1

## 2019-05-10 NOTE — Discharge Instructions (Addendum)
Please use your mask.  Please wash your hands frequently.  Please maintain adequate distancing from others.  Please use 2 sprays of Afrin in each nostril every 8 hours or 3 times a day.  Please use this for only 5 days.  Use the cough medication every 4 hours as needed for cough and congestion.  Use Tylenol extra strength every 4 hours as needed for body aches or headache.  Please see your primary physician or return to the emergency department if there are any high fever that would not respond to Tylenol, changes in your condition, problems or concerns.

## 2019-05-10 NOTE — ED Triage Notes (Signed)
Pt c/o runny nose, dry cough, headache, nasal drainage that started yesterday.

## 2019-05-10 NOTE — ED Provider Notes (Signed)
[facility] EMERGENCY DEPARTMENT Provider Note   CSN: [id number] Arrival date & time: 05/10/19  1233     History   Chief Complaint No chief complaint on file.   HPI Marc [name] is a 23 y.o. male.     Patient is a 23 year old male who presents to the emergency department with a complaint of runny nose and cough.  The patient states that over the last 24 to 48 hours he has been having nasal congestion, headache, cough, and runny nose.  He says he has a history of asthma.  He says at times it feels as though he cannot breathe well primarily because of all the congestion in his nasal passages.  He has not had fever or chills.  Is not had any vomiting.  He says he is not had any loss of taste or smell.  He has not been around anyone with the COVID-19 virus that he is aware of.  He has not been traveling recently.  Nothing seems to make this problem any better, and nothing makes it any worse.  The history is provided by the patient.    Past Medical History:  Diagnosis Date  . Anxiety   . Asthma   . Depressed   . Polysubstance abuse (HCC)   . Seizures (HCC)   . Wears glasses     Patient Active Problem List   Diagnosis Date Noted  . MDD (major depressive disorder), recurrent episode, severe (HCC) 04/05/2018  . MDD (major depressive disorder) 06/22/2017  . 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 Severe episode of recurrent major depressive disorder, without psychotic features (HCC)   . Bipolar disorder, unspecified (HCC) 03/23/2017  . Depression, major, recurrent, in partial remission (HCC) 06/03/2011  . ELBOW SPRAIN, LEFT 09/17/2010    Past Surgical History:  Procedure Laterality Date  . COLON SURGERY    . FRACTURE SURGERY          Home Medications    Prior to Admission medications   Medication Sig Start Date End Date Taking? Authorizing Provider  gabapentin (NEURONTIN) 300 MG capsule Take 1 capsule (300 mg  total) by mouth 3 (three) times daily. For mood control Patient not taking: Reported on 03/07/2019 04/08/18   Money, Gerlene Burdockravis B, FNP  HYDROcodone-acetaminophen (NORCO/VICODIN) 5-325 MG tablet Take 1 every 6 hours for pain that is not relieved by Tylenol alone Patient not taking: Reported on 03/07/2019 02/08/19   Bethann BerkshireZammit, Joseph, MD  hydrOXYzine (ATARAX/VISTARIL) 50 MG tablet Take 1 tablet (50 mg total) by mouth 3 (three) times daily as needed for anxiety. Patient not taking: Reported on 03/07/2019 04/08/18   Money, Gerlene Burdockravis B, FNP  sertraline (ZOLOFT) 50 MG tablet Take 1 tablet (50 mg total) by mouth daily. For mood control Patient not taking: Reported on 03/07/2019 04/09/18   Money, Gerlene Burdockravis B, FNP  traZODone (DESYREL) 100 MG tablet Take 1 tablet (100 mg total) by mouth at bedtime as needed for sleep. Patient not taking: Reported on 03/07/2019 04/08/18   Money, Gerlene Burdockravis B, FNP    Family History Family History  Problem Relation Age of Onset  . Anxiety disorder Father   . Depression Father   . Anxiety disorder Paternal Grandfather   . Anxiety disorder Mother   . Depression Mother   . Depression Cousin   . Drug abuse Cousin     Social History Social History   Tobacco Use  . Smoking status: Current Every Day Smoker    Packs/day:  1.00    Types: Cigarettes  . Smokeless tobacco: Never Used  Substance Use Topics  . Alcohol use: Yes    Frequency: Never    Comment: one beer a week  . Drug use: Yes    Frequency: 7.0 times per week    Types: Marijuana    Comment: couple blunts per day     Allergies   Promethazine hcl   Review of Systems Review of Systems  Constitutional: Negative for activity change and appetite change.  HENT: Positive for congestion. Negative for ear discharge, ear pain, facial swelling, nosebleeds, rhinorrhea, sneezing and tinnitus.   Eyes: Negative for photophobia, pain and discharge.  Respiratory: Positive for cough. Negative for choking, shortness of breath and wheezing.    Cardiovascular: Negative for chest pain, palpitations and leg swelling.  Gastrointestinal: Negative for abdominal pain, blood in stool, constipation, diarrhea, nausea and vomiting.  Genitourinary: Negative for difficulty urinating, dysuria, flank pain, frequency and hematuria.  Musculoskeletal: Negative for back pain, gait problem, myalgias and neck pain.  Skin: Negative for color change, rash and wound.  Neurological: Positive for headaches. Negative for dizziness, seizures, syncope, facial asymmetry, speech difficulty, weakness and numbness.  Hematological: Negative for adenopathy. Does not bruise/bleed easily.  Psychiatric/Behavioral: Negative for agitation, confusion, hallucinations, self-injury and suicidal ideas. The patient is not nervous/anxious.      Physical Exam Updated Vital Signs BP 122/85 (BP Location: Right Arm)   Pulse 77   Temp 98.5 F (36.9 C) (Oral)   Resp 18   Ht 5\' 5"  (1.651 m)   Wt 54.4 kg   SpO2 100%   BMI 19.97 kg/m   Physical Exam Constitutional:      Appearance: Normal appearance. He is well-developed.  HENT:     Head: Normocephalic and atraumatic.     Right Ear: Tympanic membrane, ear canal and external ear normal.     Left Ear: Tympanic membrane, ear canal and external ear normal.     Nose: Congestion present.     Mouth/Throat:     Pharynx: Uvula midline.     Comments: Mild increased redness of the posterior pharynx.  Airway is patent. Eyes:     General: Lids are normal.     Conjunctiva/sclera: Conjunctivae normal.     Pupils: Pupils are equal, round, and reactive to light.  Neck:     Musculoskeletal: Normal range of motion and neck supple. No neck rigidity.     Vascular: No carotid bruit.     Trachea: Trachea and phonation normal.  Cardiovascular:     Rate and Rhythm: Normal rate and regular rhythm.     Pulses: Normal pulses.  Pulmonary:     Breath sounds: No wheezing, rhonchi or rales.  Abdominal:     General: Bowel sounds are normal.      Palpations: Abdomen is soft.  Lymphadenopathy:     Head:     Right side of head: No submental, preauricular or posterior auricular adenopathy.     Left side of head: No submental, preauricular or posterior auricular adenopathy.     Cervical: No cervical adenopathy.  Skin:    General: Skin is warm and dry.  Neurological:     Mental Status: He is alert.     GCS: GCS eye subscore is 4. GCS verbal subscore is 5. GCS motor subscore is 6.     Cranial Nerves: No cranial nerve deficit.     Sensory: No sensory deficit.  Psychiatric:  Speech: Speech normal.      ED Treatments / Results  Labs (all labs ordered are listed, but only abnormal results are displayed) Labs Reviewed - No data to display  EKG None  Radiology No results found.  Procedures Procedures (including critical care time)  Medications Ordered in ED Medications - No data to display   Initial Impression / Assessment and Plan / ED Course  I have reviewed the triage vital signs and the nursing notes.  Pertinent labs & imaging results that were available during my care of the patient were reviewed by me and considered in my medical decision making (see chart for details).          Final Clinical Impressions(s) / ED Diagnoses MDM   vital signs within normal limits.  Pulse oximetry is 100% on room air.  Within normal limits by my interpretation.  Patient has nasal congestion and symptoms of upper respiratory infection.  There has not been any loss of taste or smell, no high fever, no hemoptysis, no known exposure to COVID-19, no recent travel.  Low suspicion for COVID-19 virus.  Patient will be treated with Afrin spray, and Robitussin-DM.  I have asked him to use Tylenol every 4 hours for aching, and for fever should it occur.  Have asked him to return if any high fever, any hemoptysis, any difficulty with breathing, changes in his condition, problems or concerns.  We also discussed the importance of  continuing to use his mask, increasing fluids, washing hands frequently, and maintaining adequate distancing from others.  Patient knowledges understanding of these instructions.   Final diagnoses:  Upper respiratory tract infection, unspecified type    ED Discharge Orders         Ordered    dextromethorphan-guaiFENesin (ROBITUSSIN-DM) 10-100 MG/5ML liquid  Every 4 hours PRN     05/10/19 1424           Lily Kocher, PA-C 05/10/19 1437    Isla Pence, MD 05/11/19 [phone]

## 2019-06-19 ENCOUNTER — Other Ambulatory Visit: Payer: Self-pay

## 2019-06-19 ENCOUNTER — Emergency Department (HOSPITAL_COMMUNITY)
Admission: EM | Admit: 2019-06-19 | Discharge: 2019-06-19 | Disposition: A | Discharge status: Home / Self Care | Payer: Self-pay | Attending: Emergency Medicine | Admitting: Emergency Medicine

## 2019-06-19 ENCOUNTER — Encounter (HOSPITAL_COMMUNITY): Payer: Self-pay | Admitting: Emergency Medicine

## 2019-06-19 ENCOUNTER — Emergency Department (HOSPITAL_COMMUNITY): Payer: Self-pay

## 2019-06-19 DIAGNOSIS — J45909 Unspecified asthma, uncomplicated: Secondary | ICD-10-CM | POA: Insufficient documentation

## 2019-06-19 DIAGNOSIS — R1030 Lower abdominal pain, unspecified: Secondary | ICD-10-CM | POA: Insufficient documentation

## 2019-06-19 DIAGNOSIS — F17210 Nicotine dependence, cigarettes, uncomplicated: Secondary | ICD-10-CM | POA: Insufficient documentation

## 2019-06-19 LAB — CBC WITH DIFFERENTIAL/PLATELET
Abs Immature Granulocytes: 0 10*3/uL (ref 0.00–0.07)
Basophils Absolute: 0.1 10*3/uL (ref 0.0–0.1)
Basophils Relative: 1 %
Eosinophils Absolute: 0.3 10*3/uL (ref 0.0–0.5)
Eosinophils Relative: 4 %
HCT: 45.9 % (ref 39.0–52.0)
Hemoglobin: 15.3 g/dL (ref 13.0–17.0)
Immature Granulocytes: 0 %
Lymphocytes Relative: 47 %
Lymphs Abs: 3.9 10*3/uL (ref 0.7–4.0)
MCH: 31.6 pg (ref 26.0–34.0)
MCHC: 33.3 g/dL (ref 30.0–36.0)
MCV: 94.8 fL (ref 80.0–100.0)
Monocytes Absolute: 0.6 10*3/uL (ref 0.1–1.0)
Monocytes Relative: 7 %
Neutro Abs: 3.3 10*3/uL (ref 1.7–7.7)
Neutrophils Relative %: 41 %
Platelets: 194 10*3/uL (ref 150–400)
RBC: 4.84 MIL/uL (ref 4.22–5.81)
RDW: 11.9 % (ref 11.5–15.5)
WBC: 8.2 10*3/uL (ref 4.0–10.5)
nRBC: 0 % (ref 0.0–0.2)

## 2019-06-19 LAB — URINALYSIS, ROUTINE W REFLEX MICROSCOPIC
Bilirubin Urine: NEGATIVE
Glucose, UA: NEGATIVE mg/dL
Hgb urine dipstick: NEGATIVE
Ketones, ur: NEGATIVE mg/dL
Leukocytes,Ua: NEGATIVE
Nitrite: NEGATIVE
Protein, ur: NEGATIVE mg/dL
Specific Gravity, Urine: >1.046 — ABNORMAL HIGH (ref 1.005–1.030)
pH: 7 (ref 5.0–8.0)

## 2019-06-19 LAB — COMPREHENSIVE METABOLIC PANEL
ALT: 19 U/L (ref 0–44)
AST: 17 U/L (ref 15–41)
Albumin: 4.3 g/dL (ref 3.5–5.0)
Alkaline Phosphatase: 43 U/L (ref 38–126)
Anion gap: 10 (ref 5–15)
BUN: 23 mg/dL — ABNORMAL HIGH (ref 6–20)
CO2: 26 mmol/L (ref 22–32)
Calcium: 9.2 mg/dL (ref 8.9–10.3)
Chloride: 102 mmol/L (ref 98–111)
Creatinine, Ser: 0.92 mg/dL (ref 0.61–1.24)
GFR calc Af Amer: >60 mL/min (ref >60)
GFR calc non Af Amer: >60 mL/min (ref >60)
Glucose, Bld: 95 mg/dL (ref 70–99)
Potassium: 4.1 mmol/L (ref 3.5–5.1)
Sodium: 138 mmol/L (ref 135–145)
Total Bilirubin: 0.7 mg/dL (ref 0.3–1.2)
Total Protein: 6.9 g/dL (ref 6.5–8.1)

## 2019-06-19 LAB — LIPASE, BLOOD: Lipase: 35 U/L (ref 11–51)

## 2019-06-19 MED ORDER — IOHEXOL 300 MG/ML  SOLN
75.0000 mL | Freq: Once | INTRAMUSCULAR | Status: AC | PRN
  Administered 2019-06-19: 75 mL via INTRAVENOUS

## 2019-06-19 MED ORDER — ONDANSETRON HCL 4 MG/2ML IJ SOLN
4.0000 mg | Freq: Once | INTRAMUSCULAR | Status: AC
  Administered 2019-06-19: 4 mg via INTRAVENOUS
  Filled 2019-06-19: qty 2

## 2019-06-19 MED ORDER — SODIUM CHLORIDE 0.9 % IV SOLN
INTRAVENOUS | Status: DC
  Administered 2019-06-19: 11:00:00 via INTRAVENOUS

## 2019-06-19 NOTE — ED Triage Notes (Signed)
Pt c/o of R and LLQ pain with n/v since last night. States " I have a hernia and I think its gotten worse"

## 2019-06-19 NOTE — ED Provider Notes (Signed)
[facility] EMERGENCY DEPARTMENT Provider Note   CSN: [id number] Arrival date & time: 06/19/19  0908     History   Chief Complaint Chief Complaint  Patient presents with   Abdominal Pain    HPI Marc [name] is a 23 y.o. male.     Patient seen in the past with some left groin discomfort.  Felt to have a small left inguinal hernia.  Has followed up with Dr. Lovell Sheehan from general surgery who is planning on repairing that.  Patient's had some persistent pain in the left groin but now is developed some right groin pain.  Pain today occasionally radiates down into the testicles.  Pain is never severe.  No nausea no vomiting.  Patient when he was very young had abdominal surgery with some resection of bowel not exactly clear why.  No fevers no upper respiratory symptoms.  Denies any hematuria.     Past Medical History:  Diagnosis Date   Anxiety    Asthma    Depressed    Polysubstance abuse (HCC)    Seizures (HCC)    Wears glasses     Patient Active Problem List   Diagnosis Date Noted   MDD (major depressive disorder), recurrent episode, severe (HCC) 04/05/2018   MDD (major depressive disorder) 06/22/2017   Major depressive disorder, recurrent severe without psychotic features (HCC) 06/21/2017   Severe episode of recurrent major depressive disorder, without psychotic features (HCC)    Bipolar disorder, unspecified (HCC) 03/23/2017   Depression, major, recurrent, in partial remission (HCC) 06/03/2011   ELBOW SPRAIN, LEFT 09/17/2010    Past Surgical History:  Procedure Laterality Date   COLON SURGERY     FRACTURE SURGERY          Home Medications    Prior to Admission medications   Not on File    Family History Family History  Problem Relation Age of Onset   Anxiety disorder Father    Depression Father    Anxiety disorder Paternal Grandfather    Anxiety disorder Mother    Depression Mother    Depression Cousin    Drug abuse  Cousin     Social History Social History   Tobacco Use   Smoking status: Current Every Day Smoker    Packs/day: 1.00    Types: Cigarettes   Smokeless tobacco: Never Used  Substance Use Topics   Alcohol use: Yes    Frequency: Never    Comment: one beer a week   Drug use: Yes    Frequency: 7.0 times per week    Types: Marijuana    Comment: couple blunts per day     Allergies   Promethazine hcl   Review of Systems Review of Systems  Constitutional: Negative for chills and fever.  HENT: Negative for congestion, rhinorrhea and sore throat.   Eyes: Negative for visual disturbance.  Respiratory: Negative for cough and shortness of breath.   Cardiovascular: Negative for chest pain and leg swelling.  Gastrointestinal: Positive for abdominal pain. Negative for diarrhea, nausea and vomiting.  Genitourinary: Positive for testicular pain. Negative for dysuria.  Musculoskeletal: Negative for back pain and neck pain.  Skin: Negative for rash.  Neurological: Negative for dizziness, light-headedness and headaches.  Hematological: Does not bruise/bleed easily.  Psychiatric/Behavioral: Negative for confusion.     Physical Exam Updated Vital Signs BP 107/76    Pulse (!) 50    Temp 98.9 F (37.2 C) (Oral)    Resp 18    Ht 1.651  m (5\' 5" )    Wt 52.2 kg    SpO2 100%    BMI 19.14 kg/m   Physical Exam Vitals signs and nursing note reviewed.  Constitutional:      Appearance: Normal appearance. He is well-developed.  HENT:     Head: Normocephalic and atraumatic.  Eyes:     Extraocular Movements: Extraocular movements intact.     Conjunctiva/sclera: Conjunctivae normal.     Pupils: Pupils are equal, round, and reactive to light.  Neck:     Musculoskeletal: Normal range of motion and neck supple.  Cardiovascular:     Rate and Rhythm: Normal rate and regular rhythm.     Heart sounds: No murmur.  Pulmonary:     Effort: Pulmonary effort is normal. No respiratory distress.      Breath sounds: Normal breath sounds.  Abdominal:     Palpations: Abdomen is soft.     Tenderness: There is no abdominal tenderness.  Genitourinary:    Penis: Normal.      Scrotum/Testes: Normal.     Comments: Questionable slight left inguinal hernia.  Nothing incarcerated.  Circumcised no penile discharge no lesions.  No palpable mass in the testes no scrotal swelling.  But a slight discomfort to the testes to palpation.  No erythema.  No groin adenopathy. Musculoskeletal:        General: No swelling or tenderness.  Skin:    General: Skin is warm and dry.     Capillary Refill: Capillary refill takes less than 2 seconds.  Neurological:     General: No focal deficit present.     Mental Status: He is alert and oriented to person, place, and time.      ED Treatments / Results  Labs (all labs ordered are listed, but only abnormal results are displayed) Labs Reviewed  COMPREHENSIVE METABOLIC PANEL - Abnormal; Notable for the following components:      Result Value   BUN 23 (*)    All other components within normal limits  URINALYSIS, ROUTINE W REFLEX MICROSCOPIC - Abnormal; Notable for the following components:   Specific Gravity, Urine >1.046 (*)    All other components within normal limits  LIPASE, BLOOD  CBC WITH DIFFERENTIAL/PLATELET    EKG None  Radiology Ct Abdomen Pelvis W Contrast  Result Date: 06/19/2019 CLINICAL DATA:  Lower abdominal pain with nausea and vomiting EXAM: CT ABDOMEN AND PELVIS WITH CONTRAST TECHNIQUE: Multidetector CT imaging of the abdomen and pelvis was performed using the standard protocol following bolus administration of intravenous contrast. CONTRAST:  75mL OMNIPAQUE IOHEXOL 300 MG/ML  SOLN COMPARISON:  August 24, 2017 FINDINGS: Lower chest: Lung bases are clear. Hepatobiliary: There is slight fatty infiltration near the fissure for the ligamentum teres. No focal liver lesions are evident. Note, however, that there is a mild degree of periportal  edema. Gallbladder is mildly contracted. There is no appreciable biliary duct dilatation. Pancreas: There is no pancreatic mass or inflammatory focus. Spleen: No splenic lesions are evident. Adrenals/Urinary Tract: Adrenals bilaterally appear normal. There is no appreciable renal mass or hydronephrosis on either side. There is no renal or ureteral calculus evident on either side. Urinary bladder is midline with wall thickness within normal limits. Stomach/Bowel: There is no appreciable bowel wall or mesenteric thickening. No evident bowel obstruction. Terminal ileum appears unremarkable. There is no evident free air or portal venous air. Vascular/Lymphatic: There is no abdominal aortic aneurysm. No vascular lesions are evident. There is no adenopathy in the abdomen or pelvis.  Reproductive: Prostate and seminal vesicles are normal in size and contour. No pelvic mass evident. Other: Appendix appears normal. No abscess or ascites is evident in the abdomen or pelvis. Musculoskeletal: No blastic or lytic bone lesions. No evident intramuscular or abdominal wall lesion. IMPRESSION: 1. There is evidence of periportal edema. Etiology uncertain. Advise correlation with appropriate laboratory assessment, with particular attention to liver enzymes. No focal liver lesions evident. Slight fatty infiltration noted near the fissure for the ligamentum teres. 2. No demonstrable bowel obstruction. No abscess in the abdomen pelvis. Appendix region appears normal. 3. No evident renal or ureteral calculus. No hydronephrosis. Urinary bladder wall thickness within normal limits. Electronically Signed   By: Lowella Grip III M.D.   On: 06/19/2019 11:50    Procedures Procedures (including critical care time)  Medications Ordered in ED Medications  0.9 %  sodium chloride infusion ( Intravenous New Bag/Given 06/19/19 1104)  ondansetron (ZOFRAN) injection 4 mg (4 mg Intravenous Given 06/19/19 1148)  iohexol (OMNIPAQUE) 300 MG/ML  solution 75 mL (75 mLs Intravenous Contrast Given 06/19/19 1142)     Initial Impression / Assessment and Plan / ED Course  I have reviewed the triage vital signs and the nursing notes.  Pertinent labs & imaging results that were available during my care of the patient were reviewed by me and considered in my medical decision making (see chart for details).        Patient's work-up CT scan of the abdomen without any acute findings.  No evidence of hernia.  But clinically I think there is a small hernia on the left side.  Patient's urinalysis negative.  Did opt for ultrasound of the testicle since he is at the complaint of intermittent testicular pain.  Not really worried about torsion pain was never severe.  But more concerned may be about testicular mass although nothing was palpable on exam.  No evidence of epididymitis.  Patient did not want to get the ultrasound.  We will make a referral to urology also back to general surgery that was planning to repair the left inguinal hernia.  Patient knows he can come back at any time during the week for ultrasound of the testes if he changes his mind.  Patient was aware that the purpose of the ultrasound would be to rule out a testicular mass.  Final Clinical Impressions(s) / ED Diagnoses   Final diagnoses:  Lower abdominal pain    ED Discharge Orders    None       Fredia Sorrow, MD 06/19/19 1409

## 2019-06-19 NOTE — Discharge Instructions (Signed)
Work-up today without any acute findings.  Follow-up with general surgery as needed.  Clinically there does appear to be a small left inguinal hernia.  CT scan showed no evidence of anything entrapped and no other abnormality in the abdomen.  Urinalysis today was negative.  Exact cause of the symptoms not completely clear.  Return for any new or worse symptoms.  Also referrals been provided to urology if the testicular pain continues.  We did offer doing ultrasound of the testicles today.-Change her mind on that she can return.  Or follow-up with urology.  Purpose of this would be to rule out a testicular tumor.

## 2019-08-02 ENCOUNTER — Other Ambulatory Visit: Payer: Self-pay

## 2019-08-02 ENCOUNTER — Ambulatory Visit: Payer: Self-pay | Attending: Internal Medicine

## 2019-08-02 DIAGNOSIS — Z20822 Contact with and (suspected) exposure to covid-19: Secondary | ICD-10-CM | POA: Insufficient documentation

## 2019-08-04 LAB — NOVEL CORONAVIRUS, NAA: SARS-CoV-2, NAA: NOT DETECTED

## 2019-12-30 ENCOUNTER — Emergency Department (HOSPITAL_COMMUNITY)
Admission: EM | Admit: 2019-12-30 | Discharge: 2019-12-30 | Disposition: A | Discharge status: Home / Self Care | Payer: Self-pay | Attending: Emergency Medicine | Admitting: Emergency Medicine

## 2019-12-30 ENCOUNTER — Other Ambulatory Visit: Payer: Self-pay

## 2019-12-30 ENCOUNTER — Encounter (HOSPITAL_COMMUNITY): Payer: Self-pay

## 2019-12-30 DIAGNOSIS — T782XXA Anaphylactic shock, unspecified, initial encounter: Secondary | ICD-10-CM

## 2019-12-30 DIAGNOSIS — J45909 Unspecified asthma, uncomplicated: Secondary | ICD-10-CM | POA: Insufficient documentation

## 2019-12-30 DIAGNOSIS — F17210 Nicotine dependence, cigarettes, uncomplicated: Secondary | ICD-10-CM | POA: Insufficient documentation

## 2019-12-30 LAB — COMPREHENSIVE METABOLIC PANEL
ALT: 23 U/L (ref 0–44)
AST: 31 U/L (ref 15–41)
Albumin: 4.5 g/dL (ref 3.5–5.0)
Alkaline Phosphatase: 43 U/L (ref 38–126)
Anion gap: 11 (ref 5–15)
BUN: 12 mg/dL (ref 6–20)
CO2: 26 mmol/L (ref 22–32)
Calcium: 9.2 mg/dL (ref 8.9–10.3)
Chloride: 104 mmol/L (ref 98–111)
Creatinine, Ser: 1.04 mg/dL (ref 0.61–1.24)
GFR calc Af Amer: >60 mL/min (ref >60)
GFR calc non Af Amer: >60 mL/min (ref >60)
Glucose, Bld: 113 mg/dL — ABNORMAL HIGH (ref 70–99)
Potassium: 3.4 mmol/L — ABNORMAL LOW (ref 3.5–5.1)
Sodium: 141 mmol/L (ref 135–145)
Total Bilirubin: 0.6 mg/dL (ref 0.3–1.2)
Total Protein: 7.1 g/dL (ref 6.5–8.1)

## 2019-12-30 LAB — CBC WITH DIFFERENTIAL/PLATELET
Abs Immature Granulocytes: 0.03 10*3/uL (ref 0.00–0.07)
Basophils Absolute: 0.1 10*3/uL (ref 0.0–0.1)
Basophils Relative: 0 %
Eosinophils Absolute: 0.3 10*3/uL (ref 0.0–0.5)
Eosinophils Relative: 2 %
HCT: 44.2 % (ref 39.0–52.0)
Hemoglobin: 15.3 g/dL (ref 13.0–17.0)
Immature Granulocytes: 0 %
Lymphocytes Relative: 48 %
Lymphs Abs: 6 10*3/uL — ABNORMAL HIGH (ref 0.7–4.0)
MCH: 32.1 pg (ref 26.0–34.0)
MCHC: 34.6 g/dL (ref 30.0–36.0)
MCV: 92.9 fL (ref 80.0–100.0)
Monocytes Absolute: 0.8 10*3/uL (ref 0.1–1.0)
Monocytes Relative: 7 %
Neutro Abs: 5.3 10*3/uL (ref 1.7–7.7)
Neutrophils Relative %: 43 %
Platelets: 258 10*3/uL (ref 150–400)
RBC: 4.76 MIL/uL (ref 4.22–5.81)
RDW: 12.1 % (ref 11.5–15.5)
WBC: 12.5 10*3/uL — ABNORMAL HIGH (ref 4.0–10.5)
nRBC: 0 % (ref 0.0–0.2)

## 2019-12-30 LAB — ACETAMINOPHEN LEVEL: Acetaminophen (Tylenol), Serum: <10 ug/mL — ABNORMAL LOW (ref 10–30)

## 2019-12-30 LAB — ETHANOL: Alcohol, Ethyl (B): <10 mg/dL (ref <10)

## 2019-12-30 LAB — SALICYLATE LEVEL: Salicylate Lvl: 11.2 mg/dL (ref 7.0–30.0)

## 2019-12-30 MED ORDER — FAMOTIDINE IN NACL 20-0.9 MG/50ML-% IV SOLN
20.0000 mg | Freq: Once | INTRAVENOUS | Status: AC
  Administered 2019-12-30: 20 mg via INTRAVENOUS
  Filled 2019-12-30: qty 50

## 2019-12-30 MED ORDER — EPINEPHRINE 0.3 MG/0.3ML IJ SOAJ: 0.3000 mg | INTRAMUSCULAR | 0 refills | Status: DC | PRN

## 2019-12-30 MED ORDER — DIPHENHYDRAMINE HCL 50 MG/ML IJ SOLN
25.0000 mg | Freq: Once | INTRAMUSCULAR | Status: AC
  Administered 2019-12-30: 25 mg via INTRAVENOUS
  Filled 2019-12-30: qty 1

## 2019-12-30 MED ORDER — METHYLPREDNISOLONE SODIUM SUCC 125 MG IJ SOLR
125.0000 mg | Freq: Once | INTRAMUSCULAR | Status: AC
  Administered 2019-12-30: 125 mg via INTRAVENOUS
  Filled 2019-12-30: qty 2

## 2019-12-30 MED ORDER — EPINEPHRINE 0.3 MG/0.3ML IJ SOAJ
0.3000 mg | Freq: Once | INTRAMUSCULAR | Status: AC
  Administered 2019-12-30: 0.3 mg via INTRAMUSCULAR

## 2019-12-30 MED ORDER — PENICILLIN V POTASSIUM 500 MG PO TABS
500.0000 mg | ORAL_TABLET | Freq: Four times a day (QID) | ORAL | 0 refills | Status: AC
Start: 2019-12-30 — End: 2020-01-06

## 2019-12-30 MED ORDER — EPINEPHRINE 0.3 MG/0.3ML IJ SOAJ
INTRAMUSCULAR | Status: AC
  Filled 2019-12-30: qty 0.3

## 2019-12-30 MED ORDER — PREDNISONE 50 MG PO TABS
ORAL_TABLET | ORAL | 0 refills | Status: DC
Start: 2019-12-30 — End: 2020-03-09

## 2019-12-30 NOTE — ED Triage Notes (Signed)
Pt reports onset of itching and redness all over his body approx 1 hour ago, states he had taken a couple of aspirin for a headache.  Pt denies diff swallowing, but states he is starting to feel a little sob.

## 2019-12-30 NOTE — Discharge Instructions (Addendum)
Take the steroids as prescribed.  Avoid aspirin in the future.  If symptoms recur with chest pain, shortness of breath, tongue swelling or lip swelling use the epinephrine pen that was prescribed.  If you use the epinephrine pen you must come to the hospital afterwards. Return to the ED with new or worsening symptoms.

## 2019-12-30 NOTE — ED Provider Notes (Signed)
[facility] EMERGENCY DEPARTMENT Provider Note   CSN: [id number] Arrival date & time: 12/30/19  0256     History Chief Complaint  Patient presents with  . Allergic Reaction    Marc [name] is a 24 y.o. male.  Level 5 caveat for acuity of condition.  Patient presents with suspected allergic reaction to aspirin.  States he took approximately 4 tablets of full-strength aspirin over the course of 4 hours for a toothache.  He has had aspirin in the past but not for a while.  1 hour ago he developed diffuse itching and redness to his body with throat tightness and difficulty swallowing and shortness of breath.  He denies any chest pain.  No abdominal pain, nausea or vomiting. Denies any other new exposures.  No allergy to promethazine but no other allergens.  Denies any attempt at self-harm. States he has not had aspirin for quite some time.   Allergic Reaction Presenting symptoms: difficulty swallowing and rash        Past Medical History:  Diagnosis Date  . Anxiety   . Asthma   . Depressed   . Polysubstance abuse (HCC)   . Seizures (HCC)   . Wears glasses     Patient Active Problem List   Diagnosis Date Noted  . MDD (major depressive disorder), recurrent episode, severe (HCC) 04/05/2018  . MDD (major depressive disorder) 06/22/2017  . 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 Severe episode of recurrent major depressive disorder, without psychotic features (HCC)   . Bipolar disorder, unspecified (HCC) 03/23/2017  . Depression, major, recurrent, in partial remission (HCC) 06/03/2011  . ELBOW SPRAIN, LEFT 09/17/2010    Past Surgical History:  Procedure Laterality Date  . COLON SURGERY    . FRACTURE SURGERY         Family History  Problem Relation Age of Onset  . Anxiety disorder Father   . Depression Father   . Anxiety disorder Paternal Grandfather   . Anxiety disorder Mother   . Depression Mother   .  Depression Cousin   . Drug abuse Cousin     Social History   Tobacco Use  . Smoking status: Current Every Day Smoker    Packs/day: 1.00    Types: Cigarettes  . Smokeless tobacco: Never Used  Vaping Use  . Vaping Use: Never used  Substance Use Topics  . Alcohol use: Yes    Comment: one beer a week  . Drug use: Yes    Frequency: 7.0 times per week    Types: Marijuana    Comment: couple blunts per day    Home Medications Prior to Admission medications   Not on File    Allergies    Promethazine hcl  Review of Systems   Review of Systems  Constitutional: Negative for activity change, appetite change and fever.  HENT: Positive for trouble swallowing. Negative for sore throat.   Respiratory: Positive for chest tightness and shortness of breath.   Cardiovascular: Negative for chest pain.  Gastrointestinal: Negative for abdominal pain, nausea and vomiting.  Genitourinary: Negative for dysuria and hematuria.  Musculoskeletal: Negative for arthralgias and myalgias.  Skin: Positive for rash.  Neurological: Negative for dizziness, weakness and headaches.   all other systems are negative except as noted in the HPI and PMH.    Physical Exam Updated Vital Signs BP 120/80 (BP Location: Right Arm)   Pulse 74   Temp 97.8 F (36.6 C) (Oral)   Resp  18   Ht 5\' 7"  (1.702 m)   Wt 54.4 kg   SpO2 98%   BMI 18.79 kg/m   Physical Exam Vitals and nursing note reviewed.  Constitutional:      General: He is not in acute distress.    Appearance: He is well-developed. He is ill-appearing.  HENT:     Head: Normocephalic and atraumatic.     Nose: Nose normal. No rhinorrhea.     Mouth/Throat:     Pharynx: No oropharyngeal exudate.     Comments: No tongue or lip swelling.  Uvula is midline.  Large carie  Left lower molar, floor of mouth is soft. Eyes:     Conjunctiva/sclera: Conjunctivae normal.     Pupils: Pupils are equal, round, and reactive to light.  Neck:     Comments: No  meningismus. Cardiovascular:     Rate and Rhythm: Regular rhythm. Tachycardia present.     Heart sounds: Normal heart sounds. No murmur heard.   Pulmonary:     Effort: Pulmonary effort is normal. No respiratory distress.     Breath sounds: Normal breath sounds.  Abdominal:     Palpations: Abdomen is soft.     Tenderness: There is no abdominal tenderness. There is no guarding or rebound.  Musculoskeletal:        General: No tenderness. Normal range of motion.     Cervical back: Normal range of motion and neck supple.  Skin:    General: Skin is warm.     Findings: Rash present.     Comments: Diffuse urticarial rash worse on trunk.  Neurological:     Mental Status: He is alert and oriented to person, place, and time.     Cranial Nerves: No cranial nerve deficit.     Motor: No abnormal muscle tone.     Coordination: Coordination normal.     Comments: No ataxia on finger to nose bilaterally. No pronator drift. 5/5 strength throughout. CN 2-12 intact.Equal grip strength. Sensation intact.   Psychiatric:        Behavior: Behavior normal.     ED Results / Procedures / Treatments   Labs (all labs ordered are listed, but only abnormal results are displayed) Labs Reviewed  CBC WITH DIFFERENTIAL/PLATELET - Abnormal; Notable for the following components:      Result Value   WBC 12.5 (*)    Lymphs Abs 6.0 (*)    All other components within normal limits  COMPREHENSIVE METABOLIC PANEL - Abnormal; Notable for the following components:   Potassium 3.4 (*)    Glucose, Bld 113 (*)    All other components within normal limits  ACETAMINOPHEN LEVEL - Abnormal; Notable for the following components:   Acetaminophen (Tylenol), Serum <10 (*)    All other components within normal limits  SALICYLATE LEVEL  ETHANOL    EKG None  Radiology No results found.  Procedures .Critical Care Performed by: , MD Authorized by: Glynn Octave, MD   Critical care provider  statement:    Critical care time (minutes):  45   Critical care was necessary to treat or prevent imminent or life-threatening deterioration of the following conditions: anaphylaxis.   Critical care was time spent personally by me on the following activities:  Discussions with consultants, evaluation of patient's response to treatment, examination of patient, ordering and performing treatments and interventions, ordering and review of laboratory studies, ordering and review of radiographic studies, pulse oximetry, re-evaluation of patient's condition, obtaining history from patient or surrogate  and review of old charts   (including critical care time)  Medications Ordered in ED Medications  famotidine (PEPCID) IVPB 20 mg premix (20 mg Intravenous New Bag/Given 12/30/19 0321)  methylPREDNISolone sodium succinate (SOLU-MEDROL) 125 mg/2 mL injection 125 mg (125 mg Intravenous Given 12/30/19 0321)  EPINEPHrine (EPI-PEN) injection 0.3 mg (0.3 mg Intramuscular Given 12/30/19 0319)  diphenhydrAMINE (BENADRYL) injection 25 mg (25 mg Intravenous Given 12/30/19 0320)    ED Course  I have reviewed the triage vital signs and the nursing notes.  Pertinent labs & imaging results that were available during my care of the patient were reviewed by me and considered in my medical decision making (see chart for details).    MDM Rules/Calculators/A&P                         Suspected anaphylaxis to aspirin.  Patient with rash, throat tightness, shortness of breath. No chest pain on arrival.  Given epinephrine, steroids and antihistamines. ASA level is in therapeutic range with normal anion gap.  Observed in the ED for 3 hours without recurrence of his symptoms.  Rash has resolved.  No further chest pain or shortness of breath.  No tongue swelling or lip swelling.  His throat feels normal.  He is tolerating secretions and p.o.  Advised to avoid aspirin in the future.  Aspirin added medication allergy list.   Patient given steroids as well as EpiPen for home use with instructions on their use.  Advised follow-up with a dentist which he states he has 1 he can see.  Will give course of penicillin which he has tolerated well in the past.  No evidence of drainable abscess or Ludwig's angina.  Follow-up with PCP.  Return to the ED with recurrent symptoms. Final Clinical Impression(s) / ED Diagnoses Final diagnoses:  Anaphylaxis, initial encounter    Rx / DC Orders ED Discharge Orders    None       Chaden Doom, Annie Main, MD 12/30/19 [phone]

## 2020-02-24 ENCOUNTER — Other Ambulatory Visit: Payer: Self-pay

## 2020-02-24 ENCOUNTER — Emergency Department (HOSPITAL_COMMUNITY)
Admission: EM | Admit: 2020-02-24 | Discharge: 2020-02-25 | Disposition: A | Discharge status: Home / Self Care | Payer: Self-pay | Attending: Emergency Medicine | Admitting: Emergency Medicine

## 2020-02-24 ENCOUNTER — Encounter (HOSPITAL_COMMUNITY): Payer: Self-pay | Admitting: *Deleted

## 2020-02-24 DIAGNOSIS — R519 Headache, unspecified: Secondary | ICD-10-CM | POA: Insufficient documentation

## 2020-02-24 DIAGNOSIS — Z5321 Procedure and treatment not carried out due to patient leaving prior to being seen by health care provider: Secondary | ICD-10-CM | POA: Insufficient documentation

## 2020-02-24 NOTE — ED Triage Notes (Signed)
C/o headache for the past 2 days 

## 2020-02-27 ENCOUNTER — Emergency Department (HOSPITAL_COMMUNITY)
Admission: EM | Admit: 2020-02-27 | Discharge: 2020-02-27 | Disposition: A | Discharge status: Home / Self Care | Payer: Self-pay | Attending: Emergency Medicine | Admitting: Emergency Medicine

## 2020-02-27 ENCOUNTER — Other Ambulatory Visit: Payer: Self-pay

## 2020-02-27 ENCOUNTER — Encounter (HOSPITAL_COMMUNITY): Payer: Self-pay | Admitting: Emergency Medicine

## 2020-02-27 DIAGNOSIS — J029 Acute pharyngitis, unspecified: Secondary | ICD-10-CM | POA: Insufficient documentation

## 2020-02-27 DIAGNOSIS — Z5321 Procedure and treatment not carried out due to patient leaving prior to being seen by health care provider: Secondary | ICD-10-CM | POA: Insufficient documentation

## 2020-02-27 DIAGNOSIS — R519 Headache, unspecified: Secondary | ICD-10-CM | POA: Insufficient documentation

## 2020-02-27 DIAGNOSIS — F419 Anxiety disorder, unspecified: Secondary | ICD-10-CM | POA: Insufficient documentation

## 2020-02-27 DIAGNOSIS — F329 Major depressive disorder, single episode, unspecified: Secondary | ICD-10-CM | POA: Insufficient documentation

## 2020-02-27 LAB — GROUP A STREP BY PCR: Group A Strep by PCR: NOT DETECTED

## 2020-02-27 NOTE — ED Triage Notes (Signed)
Pt c/o a headache, sore throat, anxiety and depression. Pt used to take medication for anxiety and depression, but stopped because he did not think it worked.

## 2020-02-28 ENCOUNTER — Other Ambulatory Visit: Payer: Self-pay

## 2020-02-28 ENCOUNTER — Emergency Department (HOSPITAL_COMMUNITY)
Admission: EM | Admit: 2020-02-28 | Discharge: 2020-02-28 | Disposition: A | Discharge status: Home / Self Care | Payer: Self-pay | Attending: Emergency Medicine | Admitting: Emergency Medicine

## 2020-02-28 ENCOUNTER — Encounter (HOSPITAL_COMMUNITY): Payer: Self-pay

## 2020-02-28 DIAGNOSIS — J029 Acute pharyngitis, unspecified: Secondary | ICD-10-CM | POA: Insufficient documentation

## 2020-02-28 DIAGNOSIS — J069 Acute upper respiratory infection, unspecified: Secondary | ICD-10-CM

## 2020-02-28 DIAGNOSIS — F17210 Nicotine dependence, cigarettes, uncomplicated: Secondary | ICD-10-CM | POA: Insufficient documentation

## 2020-02-28 DIAGNOSIS — Z20822 Contact with and (suspected) exposure to covid-19: Secondary | ICD-10-CM | POA: Insufficient documentation

## 2020-02-28 LAB — SARS CORONAVIRUS 2 BY RT PCR (HOSPITAL ORDER, PERFORMED IN ~~LOC~~ HOSPITAL LAB): SARS Coronavirus 2: NEGATIVE

## 2020-02-28 MED ORDER — CEPHALEXIN 500 MG PO CAPS
500.0000 mg | ORAL_CAPSULE | Freq: Four times a day (QID) | ORAL | 0 refills | Status: DC
Start: 2020-02-28 — End: 2020-03-13

## 2020-02-28 NOTE — ED Triage Notes (Signed)
Pt reports has had headache and sore throat for the past week.  Also reports has had difficulty concentrating and having mood swings.  Pt says just doesn't feel right lately.  Reports cough started this morning.  Pt says is not suicidal or homicidal but has been depressed.

## 2020-02-28 NOTE — ED Provider Notes (Signed)
[facility] EMERGENCY DEPARTMENT Provider Note   CSN: [id number] Arrival date & time: 02/28/20  0254     History Chief Complaint  Patient presents with  . Headache  . Sore Throat    Marc [name] is a 24 y.o. male.  Patient is a 24 year old male with past medical history of bipolar disorder, polysubstance abuse, seizures.  He presents today for evaluation of sore throat and feeling generally unwell for the past week.  He describes headache, but no cough or fever.  He denies any ill contacts.  He denies any visual disturbances, numbness, or tingling.  He denies any aggravating or alleviating factors.  The history is provided by the patient.       Past Medical History:  Diagnosis Date  . Anxiety   . Asthma   . Depressed   . Polysubstance abuse (HCC)   . Seizures (HCC)   . Wears glasses     Patient Active Problem List   Diagnosis Date Noted  . MDD (major depressive disorder), recurrent episode, severe (HCC) 04/05/2018  . MDD (major depressive disorder) 06/22/2017  . 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 Severe episode of recurrent major depressive disorder, without psychotic features (HCC)   . Bipolar disorder, unspecified (HCC) 03/23/2017  . Depression, major, recurrent, in partial remission (HCC) 06/03/2011  . ELBOW SPRAIN, LEFT 09/17/2010    Past Surgical History:  Procedure Laterality Date  . COLON SURGERY    . FRACTURE SURGERY         Family History  Problem Relation Age of Onset  . Anxiety disorder Father   . Depression Father   . Anxiety disorder Paternal Grandfather   . Anxiety disorder Mother   . Depression Mother   . Depression Cousin   . Drug abuse Cousin     Social History   Tobacco Use  . Smoking status: Current Every Day Smoker    Packs/day: 1.00    Types: Cigarettes  . Smokeless tobacco: Never Used  Vaping Use  . Vaping Use: Never used  Substance Use Topics  . Alcohol use:  Yes    Comment: one beer a week  . Drug use: Yes    Frequency: 7.0 times per week    Types: Marijuana    Comment: couple blunts per day    Home Medications Prior to Admission medications   Medication Sig Start Date End Date Taking? Authorizing Provider  EPINEPHrine (EPIPEN 2-PAK) 0.3 mg/0.3 mL IJ SOAJ injection Inject 0.3 mLs (0.3 mg total) into the muscle as needed for anaphylaxis (For severe allergic reaction with difficulty breathing, difficulty swallowing, tongue or lip swelling). 12/30/19   Rancour, Jeannett Senior, MD  predniSONE (DELTASONE) 50 MG tablet 1 tablet PO daily 12/30/19   Rancour, Jeannett Senior, MD    Allergies    Aspirin and Promethazine hcl  Review of Systems   Review of Systems  All other systems reviewed and are negative.   Physical Exam Updated Vital Signs BP 110/64 (BP Location: Right Arm)   Pulse 60   Temp 98 F (36.7 C) (Oral)   Resp 18   Ht 5\' 5"  (1.651 m)   Wt 52.2 kg   SpO2 99%   BMI 19.14 kg/m   Physical Exam Vitals and nursing note reviewed.  Constitutional:      General: He is not in acute distress.    Appearance: He is well-developed. He is not diaphoretic.  HENT:     Head: Normocephalic and atraumatic.  Mouth/Throat:     Mouth: Mucous membranes are moist.     Comments: There is mild erythema of the posterior oropharynx.  He does have cryptic tonsils with some debris present.  There is no stridor or trismus. Cardiovascular:     Rate and Rhythm: Normal rate and regular rhythm.     Heart sounds: No murmur heard.  No friction rub.  Pulmonary:     Effort: Pulmonary effort is normal. No respiratory distress.     Breath sounds: Normal breath sounds. No wheezing or rales.  Abdominal:     General: Bowel sounds are normal. There is no distension.     Palpations: Abdomen is soft.     Tenderness: There is no abdominal tenderness.  Musculoskeletal:        General: Normal range of motion.     Cervical back: Normal range of motion and neck supple.    Lymphadenopathy:     Cervical: No cervical adenopathy.  Skin:    General: Skin is warm and dry.  Neurological:     Mental Status: He is alert and oriented to person, place, and time.     Coordination: Coordination normal.     ED Results / Procedures / Treatments   Labs (all labs ordered are listed, but only abnormal results are displayed) Labs Reviewed  SARS CORONAVIRUS 2 BY RT PCR (HOSPITAL ORDER, PERFORMED IN [facility])    EKG None  Radiology No results found.  Procedures Procedures (including critical care time)  Medications Ordered in ED Medications - No data to display  ED Course  I have reviewed the triage vital signs and the nursing notes.  Pertinent labs & imaging results that were available during my care of the patient were reviewed by me and considered in my medical decision making (see chart for details).    MDM Rules/Calculators/A&P  Strep from yesterday negative, Covid today negative.  Will treat as tonsillitis.    Return prn.  Final Clinical Impression(s) / ED Diagnoses Final diagnoses:  None    Rx / DC Orders ED Discharge Orders    None       Geoffery Lyons, MD 02/28/20 [phone]

## 2020-02-28 NOTE — Discharge Instructions (Signed)
Begin taking Keflex as prescribed.  Take ibuprofen 600 mg every 6 hours as needed for pain or fever.  Drink plenty of fluids and get plenty of rest.  Return to the ER if symptoms significantly worsen or change.

## 2020-03-09 ENCOUNTER — Encounter (HOSPITAL_COMMUNITY): Payer: Self-pay

## 2020-03-09 ENCOUNTER — Emergency Department (HOSPITAL_COMMUNITY)
Admission: EM | Admit: 2020-03-09 | Discharge: 2020-03-10 | Disposition: A | Discharge status: Other Acute Inpatient Hospital | Payer: Self-pay | Attending: Emergency Medicine | Admitting: Emergency Medicine

## 2020-03-09 ENCOUNTER — Other Ambulatory Visit: Payer: Self-pay

## 2020-03-09 DIAGNOSIS — F17210 Nicotine dependence, cigarettes, uncomplicated: Secondary | ICD-10-CM | POA: Insufficient documentation

## 2020-03-09 DIAGNOSIS — F314 Bipolar disorder, current episode depressed, severe, without psychotic features: Secondary | ICD-10-CM | POA: Insufficient documentation

## 2020-03-09 DIAGNOSIS — Z20822 Contact with and (suspected) exposure to covid-19: Secondary | ICD-10-CM | POA: Insufficient documentation

## 2020-03-09 DIAGNOSIS — Z79899 Other long term (current) drug therapy: Secondary | ICD-10-CM | POA: Insufficient documentation

## 2020-03-09 DIAGNOSIS — R45851 Suicidal ideations: Secondary | ICD-10-CM | POA: Insufficient documentation

## 2020-03-09 DIAGNOSIS — J45909 Unspecified asthma, uncomplicated: Secondary | ICD-10-CM | POA: Insufficient documentation

## 2020-03-09 DIAGNOSIS — F319 Bipolar disorder, unspecified: Secondary | ICD-10-CM | POA: Insufficient documentation

## 2020-03-09 LAB — CBC
HCT: 42.3 % (ref 39.0–52.0)
Hemoglobin: 14.3 g/dL (ref 13.0–17.0)
MCH: 32.4 pg (ref 26.0–34.0)
MCHC: 33.8 g/dL (ref 30.0–36.0)
MCV: 95.9 fL (ref 80.0–100.0)
Platelets: 183 10*3/uL (ref 150–400)
RBC: 4.41 MIL/uL (ref 4.22–5.81)
RDW: 12.4 % (ref 11.5–15.5)
WBC: 6.3 10*3/uL (ref 4.0–10.5)
nRBC: 0 % (ref 0.0–0.2)

## 2020-03-09 LAB — RAPID URINE DRUG SCREEN, HOSP PERFORMED
Amphetamines: NOT DETECTED
Barbiturates: NOT DETECTED
Benzodiazepines: NOT DETECTED
Cocaine: NOT DETECTED
Opiates: NOT DETECTED
Tetrahydrocannabinol: POSITIVE — AB

## 2020-03-09 LAB — COMPREHENSIVE METABOLIC PANEL
ALT: 28 U/L (ref 0–44)
AST: 26 U/L (ref 15–41)
Albumin: 4 g/dL (ref 3.5–5.0)
Alkaline Phosphatase: 42 U/L (ref 38–126)
Anion gap: 5 (ref 5–15)
BUN: 15 mg/dL (ref 6–20)
CO2: 26 mmol/L (ref 22–32)
Calcium: 8.7 mg/dL — ABNORMAL LOW (ref 8.9–10.3)
Chloride: 108 mmol/L (ref 98–111)
Creatinine, Ser: 0.83 mg/dL (ref 0.61–1.24)
GFR calc Af Amer: >60 mL/min (ref >60)
GFR calc non Af Amer: >60 mL/min (ref >60)
Glucose, Bld: 112 mg/dL — ABNORMAL HIGH (ref 70–99)
Potassium: 4 mmol/L (ref 3.5–5.1)
Sodium: 139 mmol/L (ref 135–145)
Total Bilirubin: 0.4 mg/dL (ref 0.3–1.2)
Total Protein: 6.6 g/dL (ref 6.5–8.1)

## 2020-03-09 LAB — URINALYSIS, ROUTINE W REFLEX MICROSCOPIC
Bacteria, UA: NONE SEEN
Bilirubin Urine: NEGATIVE
Glucose, UA: NEGATIVE mg/dL
Hgb urine dipstick: NEGATIVE
Ketones, ur: NEGATIVE mg/dL
Leukocytes,Ua: NEGATIVE
Nitrite: NEGATIVE
Protein, ur: NEGATIVE mg/dL
Specific Gravity, Urine: 1.023 (ref 1.005–1.030)
pH: 6 (ref 5.0–8.0)

## 2020-03-09 LAB — ETHANOL: Alcohol, Ethyl (B): <10 mg/dL (ref <10)

## 2020-03-09 LAB — SARS CORONAVIRUS 2 BY RT PCR (HOSPITAL ORDER, PERFORMED IN ~~LOC~~ HOSPITAL LAB): SARS Coronavirus 2: NEGATIVE

## 2020-03-09 LAB — SALICYLATE LEVEL: Salicylate Lvl: <7 mg/dL — ABNORMAL LOW (ref 7.0–30.0)

## 2020-03-09 LAB — ACETAMINOPHEN LEVEL: Acetaminophen (Tylenol), Serum: <10 ug/mL — ABNORMAL LOW (ref 10–30)

## 2020-03-09 MED ORDER — NICOTINE 21 MG/24HR TD PT24
21.0000 mg | MEDICATED_PATCH | Freq: Once | TRANSDERMAL | Status: DC
  Administered 2020-03-09: 21 mg via TRANSDERMAL
  Filled 2020-03-09: qty 1

## 2020-03-09 MED ORDER — IBUPROFEN 400 MG PO TABS
600.0000 mg | ORAL_TABLET | Freq: Once | ORAL | Status: AC
  Administered 2020-03-09: 600 mg via ORAL
  Filled 2020-03-09: qty 2

## 2020-03-09 MED ORDER — ACETAMINOPHEN 325 MG PO TABS
650.0000 mg | ORAL_TABLET | Freq: Once | ORAL | Status: AC
  Administered 2020-03-09: 650 mg via ORAL
  Filled 2020-03-09: qty 2

## 2020-03-09 MED ORDER — BACITRACIN-NEOMYCIN-POLYMYXIN 400-5-5000 EX OINT
TOPICAL_OINTMENT | Freq: Once | CUTANEOUS | Status: AC
  Administered 2020-03-09: 1 via TOPICAL
  Filled 2020-03-09: qty 1

## 2020-03-09 NOTE — ED Provider Notes (Signed)
11:39 PM Marc [name], TTS states patient has been accepted at behavioral health by Dr.Maurer.   Patient is sitting on his stretcher in the hallway in no distress.  He was informed of his bed availability and he is agreeable.  Patient is also under IVC papers.   Devoria Albe, MD 03/09/20 [phone]

## 2020-03-09 NOTE — ED Notes (Signed)
Bagged lunch given

## 2020-03-09 NOTE — ED Triage Notes (Signed)
Pt reports maniac episodes recently and suicidal ideations for the past week. Pt reports if it happens it happens.. Thoughts of pulling out into traffic

## 2020-03-09 NOTE — ED Provider Notes (Signed)
[facility] EMERGENCY DEPARTMENT Provider Note   CSN: [id number] Arrival date & time: 03/09/20  3329     History Chief Complaint  Patient presents with  . V70.1    Marc [name] is a 24 y.o. male.  HPI      Marc [name] is a 24 y.o. male with past medical history of asthma, anxiety, bipolar disorder and major depressive order who presents to the Emergency Department reporting episodes of suicidal thoughts and outbursts of anger.  He states has been having difficulty with managing his anger and anxiety for more than 1 month.  Recently, he states that he has been having difficulty concentrating at work and has been thinking of harming himself.  He endorses thoughts of pulling out into traffic in order to harm himself.  He states that he feels like he has been misdiagnosed in the past.  He is not currently taking any medications and does not currently see a therapist.  He denies alcohol or drug use, auditory or visual hallucinations or homicidal thoughts or plans.    Past Medical History:  Diagnosis Date  . Anxiety   . Asthma   . Depressed   . Polysubstance abuse (HCC)   . Seizures (HCC)   . Wears glasses     Patient Active Problem List   Diagnosis Date Noted  . MDD (major depressive disorder), recurrent episode, severe (HCC) 04/05/2018  . MDD (major depressive disorder) 06/22/2017  . 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 Severe episode of recurrent major depressive disorder, without psychotic features (HCC)   . Bipolar disorder, unspecified (HCC) 03/23/2017  . Depression, major, recurrent, in partial remission (HCC) 06/03/2011  . ELBOW SPRAIN, LEFT 09/17/2010    Past Surgical History:  Procedure Laterality Date  . COLON SURGERY    . FRACTURE SURGERY         Family History  Problem Relation Age of Onset  . Anxiety disorder Father   . Depression Father   . Anxiety disorder Paternal Grandfather   .  Anxiety disorder Mother   . Depression Mother   . Depression Cousin   . Drug abuse Cousin     Social History   Tobacco Use  . Smoking status: Current Every Day Smoker    Packs/day: 1.00    Types: Cigarettes  . Smokeless tobacco: Never Used  Vaping Use  . Vaping Use: Never used  Substance Use Topics  . Alcohol use: Yes    Comment: one beer a week  . Drug use: Yes    Frequency: 7.0 times per week    Types: Marijuana    Comment: couple blunts per day    Home Medications Prior to Admission medications   Medication Sig Start Date End Date Taking? Authorizing Provider  cephALEXin (KEFLEX) 500 MG capsule Take 1 capsule (500 mg total) by mouth 4 (four) times daily. 02/28/20   Geoffery Lyons, MD  EPINEPHrine (EPIPEN 2-PAK) 0.3 mg/0.3 mL IJ SOAJ injection Inject 0.3 mLs (0.3 mg total) into the muscle as needed for anaphylaxis (For severe allergic reaction with difficulty breathing, difficulty swallowing, tongue or lip swelling). 12/30/19   Rancour, Jeannett Senior, MD  predniSONE (DELTASONE) 50 MG tablet 1 tablet PO daily 12/30/19   Rancour, Jeannett Senior, MD    Allergies    Aspirin and Promethazine hcl  Review of Systems   Review of Systems  Constitutional: Negative for chills, fatigue and fever.  HENT: Negative for trouble swallowing.   Respiratory: Negative  for cough and shortness of breath.   Cardiovascular: Negative for chest pain and palpitations.  Gastrointestinal: Negative for abdominal pain, diarrhea, nausea and vomiting.  Genitourinary: Negative for dysuria and flank pain.  Musculoskeletal: Negative for arthralgias, back pain and myalgias.  Skin: Negative for rash.  Neurological: Negative for dizziness, syncope, speech difficulty, weakness, numbness and headaches.  Hematological: Does not bruise/bleed easily.  Psychiatric/Behavioral: Positive for suicidal ideas. Negative for agitation and confusion.    Physical Exam Updated Vital Signs BP 118/74 (BP Location: Right Arm)   Pulse (!)  55   Temp 98.3 F (36.8 C) (Oral)   Resp 16   Ht 5\' 5"  (1.651 m)   Wt 54.4 kg   SpO2 100%   BMI 19.97 kg/m   Physical Exam Vitals and nursing note reviewed.  Constitutional:      General: He is not in acute distress.    Appearance: Normal appearance. He is not ill-appearing or toxic-appearing.  HENT:     Head: Normocephalic.  Eyes:     Conjunctiva/sclera: Conjunctivae normal.     Pupils: Pupils are equal, round, and reactive to light.  Neck:     Thyroid: No thyromegaly.     Meningeal: Kernig's sign absent.  Cardiovascular:     Rate and Rhythm: Normal rate and regular rhythm.     Pulses: Normal pulses.  Pulmonary:     Effort: Pulmonary effort is normal.     Breath sounds: Normal breath sounds. No wheezing.  Abdominal:     Palpations: Abdomen is soft.     Tenderness: There is no abdominal tenderness. There is no guarding or rebound.  Musculoskeletal:        General: Normal range of motion.     Cervical back: Normal range of motion and neck supple.  Skin:    General: Skin is warm.     Capillary Refill: Capillary refill takes less than 2 seconds.     Findings: No rash.  Neurological:     General: No focal deficit present.     Mental Status: He is alert and oriented to person, place, and time.     Sensory: No sensory deficit.  Psychiatric:        Attention and Perception: Attention normal.        Mood and Affect: Mood normal.        Speech: Speech normal. Speech is not tangential.        Behavior: Behavior normal. Behavior is cooperative.        Thought Content: Thought content includes suicidal ideation.     ED Results / Procedures / Treatments   Labs (all labs ordered are listed, but only abnormal results are displayed) Labs Reviewed  COMPREHENSIVE METABOLIC PANEL - Abnormal; Notable for the following components:      Result Value   Glucose, Bld 112 (*)    Calcium 8.7 (*)    All other components within normal limits  SALICYLATE LEVEL - Abnormal; Notable for  the following components:   Salicylate Lvl <7.0 (*)    All other components within normal limits  ACETAMINOPHEN LEVEL - Abnormal; Notable for the following components:   Acetaminophen (Tylenol), Serum <10 (*)    All other components within normal limits  RAPID URINE DRUG SCREEN, HOSP PERFORMED - Abnormal; Notable for the following components:   Tetrahydrocannabinol POSITIVE (*)    All other components within normal limits  SARS CORONAVIRUS 2 BY RT PCR (HOSPITAL ORDER, PERFORMED IN [facility])  ETHANOL  CBC    EKG None  Radiology No results found.  Procedures Procedures (including critical care time)  Medications Ordered in ED Medications  nicotine (NICODERM CQ - dosed in mg/24 hours) patch 21 mg (21 mg Transdermal Patch Applied 03/09/20 1818)  acetaminophen (TYLENOL) tablet 650 mg (650 mg Oral Given 03/09/20 1048)  ibuprofen (ADVIL) tablet 600 mg (600 mg Oral Given 03/09/20 1648)    ED Course  I have reviewed the triage vital signs and the nursing notes.  Pertinent labs & imaging results that were available during my care of the patient were reviewed by me and considered in my medical decision making (see chart for details).    MDM Rules/Calculators/A&P                          Patient here with suicidal ideation and reported difficulty with concentration and anger management issues.  He endorses thoughts of pulling onto traffic to harm himself.  Currently, he is voluntary and cooperative.  He will likely be medically cleared, and consult made to TTS for further evaluation.   1345  patient refusing covid test, threatening to leave.  I talked to him and convinced him to let us swab him.  Will file IVC papers as pt has expressed suicidal thoughts and TTS consult has recommended inpatient care.    1750 pt resting comfortably.  Has been served IVC.  Awaiting placement    Final Clinical Impression(s) / ED Diagnoses Final diagnoses:  Suicidal ideation    Rx /  DC Orders ED Discharge Orders    None       Rosey Bath 03/09/20 Herbie Baltimore    Raeford Razor, MD 03/12/20 1114

## 2020-03-09 NOTE — ED Notes (Signed)
EDP made aware of pt attempting to leave the dept.

## 2020-03-09 NOTE — ED Provider Notes (Signed)
RN told me that patient had asked for testing of gonorrhea/chlamydia.  He states that he was not having any symptoms but was concerned that his girlfriend may have exposed him.  UA and urine gonorrhea/chlamydia ordered.   Maxwell Caul, PA-C 03/09/20 2306    Bethann Berkshire, MD 03/11/20 1021

## 2020-03-09 NOTE — BH Assessment (Signed)
Binnie Rail, [facility] at [facility], said Pt has been accepted to the service of Dr. Viviano Simas, room 301-1. Notified Dr. Devoria Albe and Sheralyn Boatman, RN of acceptance.   Pamalee Leyden, [facility], [facility] Triage Specialist [phone]

## 2020-03-09 NOTE — ED Notes (Signed)
ED Provider at bedside. 

## 2020-03-09 NOTE — ED Notes (Signed)
Pt expressed wanting to leave and states he came voluntarily, explained to pt that the EDP has taken IVC papers on him and that pt can not leave.  [employer] here to serve IVC papers.  After talking with pt, pt attempted to walk out of the dept., [employer] stopped pt.  Pt stated "this is bullshit" but did turn around and ambulated back to bed in [facility].

## 2020-03-09 NOTE — ED Notes (Signed)
Belongings in locker, pt in scrubs
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
## 2020-03-09 NOTE — ED Notes (Signed)
Pt refusing covid test

## 2020-03-10 ENCOUNTER — Inpatient Hospital Stay (HOSPITAL_COMMUNITY)
Admission: EM | Admit: 2020-03-10 | Discharge: 2020-03-13 | DRG: 885 | Disposition: A | Discharge status: Home / Self Care | Payer: Self-pay | Attending: Psychiatry | Admitting: Psychiatry

## 2020-03-10 ENCOUNTER — Encounter (HOSPITAL_COMMUNITY): Payer: Self-pay | Admitting: Psychiatry

## 2020-03-10 DIAGNOSIS — F17200 Nicotine dependence, unspecified, uncomplicated: Secondary | ICD-10-CM

## 2020-03-10 DIAGNOSIS — F322 Major depressive disorder, single episode, severe without psychotic features: Secondary | ICD-10-CM | POA: Insufficient documentation

## 2020-03-10 DIAGNOSIS — F3160 Bipolar disorder, current episode mixed, unspecified: Principal | ICD-10-CM

## 2020-03-10 DIAGNOSIS — F1994 Other psychoactive substance use, unspecified with psychoactive substance-induced mood disorder: Secondary | ICD-10-CM

## 2020-03-10 DIAGNOSIS — F419 Anxiety disorder, unspecified: Secondary | ICD-10-CM | POA: Diagnosis present

## 2020-03-10 DIAGNOSIS — F314 Bipolar disorder, current episode depressed, severe, without psychotic features: Secondary | ICD-10-CM

## 2020-03-10 DIAGNOSIS — F1010 Alcohol abuse, uncomplicated: Secondary | ICD-10-CM

## 2020-03-10 DIAGNOSIS — R45851 Suicidal ideations: Secondary | ICD-10-CM | POA: Diagnosis present

## 2020-03-10 DIAGNOSIS — F1220 Cannabis dependence, uncomplicated: Secondary | ICD-10-CM

## 2020-03-10 DIAGNOSIS — F17210 Nicotine dependence, cigarettes, uncomplicated: Secondary | ICD-10-CM | POA: Diagnosis present

## 2020-03-10 DIAGNOSIS — G4700 Insomnia, unspecified: Secondary | ICD-10-CM | POA: Diagnosis present

## 2020-03-10 DIAGNOSIS — Z818 Family history of other mental and behavioral disorders: Secondary | ICD-10-CM

## 2020-03-10 LAB — COMPREHENSIVE METABOLIC PANEL
ALT: 32 U/L (ref 0–44)
AST: 25 U/L (ref 15–41)
Albumin: 4.2 g/dL (ref 3.5–5.0)
Alkaline Phosphatase: 41 U/L (ref 38–126)
Anion gap: 9 (ref 5–15)
BUN: 17 mg/dL (ref 6–20)
CO2: 24 mmol/L (ref 22–32)
Calcium: 9.3 mg/dL (ref 8.9–10.3)
Chloride: 105 mmol/L (ref 98–111)
Creatinine, Ser: 0.93 mg/dL (ref 0.61–1.24)
GFR calc Af Amer: >60 mL/min (ref >60)
GFR calc non Af Amer: >60 mL/min (ref >60)
Glucose, Bld: 93 mg/dL (ref 70–99)
Potassium: 4.2 mmol/L (ref 3.5–5.1)
Sodium: 138 mmol/L (ref 135–145)
Total Bilirubin: 0.8 mg/dL (ref 0.3–1.2)
Total Protein: 7.1 g/dL (ref 6.5–8.1)

## 2020-03-10 LAB — ETHANOL: Alcohol, Ethyl (B): <10 mg/dL (ref <10)

## 2020-03-10 LAB — HEPATIC FUNCTION PANEL
ALT: 29 U/L (ref 0–44)
AST: 24 U/L (ref 15–41)
Albumin: 4.5 g/dL (ref 3.5–5.0)
Alkaline Phosphatase: 44 U/L (ref 38–126)
Bilirubin, Direct: 0.1 mg/dL (ref 0.0–0.2)
Indirect Bilirubin: 0.9 mg/dL (ref 0.3–0.9)
Total Bilirubin: 1 mg/dL (ref 0.3–1.2)
Total Protein: 7.5 g/dL (ref 6.5–8.1)

## 2020-03-10 LAB — CBC
HCT: 45.4 % (ref 39.0–52.0)
Hemoglobin: 15.4 g/dL (ref 13.0–17.0)
MCH: 32.4 pg (ref 26.0–34.0)
MCHC: 33.9 g/dL (ref 30.0–36.0)
MCV: 95.6 fL (ref 80.0–100.0)
Platelets: 209 10*3/uL (ref 150–400)
RBC: 4.75 MIL/uL (ref 4.22–5.81)
RDW: 12.5 % (ref 11.5–15.5)
WBC: 7.5 10*3/uL (ref 4.0–10.5)
nRBC: 0 % (ref 0.0–0.2)

## 2020-03-10 LAB — LIPID PANEL
Cholesterol: 139 mg/dL (ref 0–200)
HDL: 40 mg/dL — ABNORMAL LOW (ref >40)
LDL Cholesterol: 72 mg/dL (ref 0–99)
Total CHOL/HDL Ratio: 3.5 RATIO
Triglycerides: 134 mg/dL (ref <150)
VLDL: 27 mg/dL (ref 0–40)

## 2020-03-10 LAB — MAGNESIUM: Magnesium: 2.3 mg/dL (ref 1.7–2.4)

## 2020-03-10 LAB — TSH: TSH: 2.389 u[IU]/mL (ref 0.350–4.500)

## 2020-03-10 MED ORDER — MAGNESIUM HYDROXIDE 400 MG/5ML PO SUSP: 30.0000 mL | Freq: Every day | ORAL | Status: DC | PRN

## 2020-03-10 MED ORDER — HYDROXYZINE HCL 10 MG PO TABS
10.0000 mg | ORAL_TABLET | Freq: Three times a day (TID) | ORAL | Status: DC | PRN
  Administered 2020-03-10 – 2020-03-13 (×5): 10 mg via ORAL
  Filled 2020-03-10 (×4): qty 1
  Filled 2020-03-10: qty 10
  Filled 2020-03-10: qty 1

## 2020-03-10 MED ORDER — HYDROXYZINE HCL 25 MG PO TABS
ORAL_TABLET | ORAL | Status: AC
  Administered 2020-03-10: 25 mg
  Filled 2020-03-10: qty 1

## 2020-03-10 MED ORDER — ONDANSETRON HCL 4 MG PO TABS
ORAL_TABLET | ORAL | Status: AC
  Filled 2020-03-10: qty 1

## 2020-03-10 MED ORDER — ARIPIPRAZOLE 5 MG PO TABS
5.0000 mg | ORAL_TABLET | Freq: Every day | ORAL | Status: DC
  Administered 2020-03-10 – 2020-03-11 (×2): 5 mg via ORAL
  Filled 2020-03-10 (×4): qty 1

## 2020-03-10 MED ORDER — ONDANSETRON HCL 4 MG PO TABS
4.0000 mg | ORAL_TABLET | Freq: Once | ORAL | Status: AC | PRN
  Administered 2020-03-10: 4 mg via ORAL

## 2020-03-10 MED ORDER — ACETAMINOPHEN 325 MG PO TABS
650.0000 mg | ORAL_TABLET | Freq: Four times a day (QID) | ORAL | Status: DC | PRN
  Administered 2020-03-10 – 2020-03-13 (×6): 650 mg via ORAL
  Filled 2020-03-10 (×5): qty 2

## 2020-03-10 MED ORDER — ACETAMINOPHEN 325 MG PO TABS
ORAL_TABLET | ORAL | Status: AC
  Administered 2020-03-10: 650 mg
  Filled 2020-03-10: qty 2

## 2020-03-10 MED ORDER — ALUM & MAG HYDROXIDE-SIMETH 200-200-20 MG/5ML PO SUSP: 30.0000 mL | ORAL | Status: DC | PRN

## 2020-03-10 MED ORDER — ESCITALOPRAM OXALATE 10 MG PO TABS
10.0000 mg | ORAL_TABLET | Freq: Every day | ORAL | Status: DC
  Administered 2020-03-10 – 2020-03-12 (×3): 10 mg via ORAL
  Filled 2020-03-10 (×6): qty 1

## 2020-03-10 MED ORDER — HYDROXYZINE HCL 25 MG PO TABS
25.0000 mg | ORAL_TABLET | Freq: Once | ORAL | Status: AC
  Administered 2020-03-10: 25 mg via ORAL
  Filled 2020-03-10: qty 1

## 2020-03-10 NOTE — Progress Notes (Signed)
Date:  03/10/2020 Time:  8:49 PM   Group Topic/Focus:  Goals Group:   The focus of this group is to help patients, review daily goals to achieve during treatment, and discuss how the patient can incorporate goal setting into their daily lives to aide in recovery. This group also aides in reassessing group goals and modifying them in case the goal set was not a good fit.   Participation Level:  Active   Participation Quality:  Appropriate   Affect:  Appropriate   Cognitive:  Appropriate   Insight: Appropriate   Engagement in Group:  Engaged   Modes of Intervention:  Discussion   Additional Comments:  Patient attended goal review and wrap up in Recovery Group and participated. Patient rated today a 5.   Marc [name] 03/10/2020, 8:30 PM
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
## 2020-03-10 NOTE — BHH Group Notes (Signed)
   Date:  03/10/2020 Time:  9:00am-9:45am  Group Topic/Focus: PROGRESSIVE RELAXATION. A group where deep breathing is taught and tensing and relaxation muscle groups is used. Imagery is used as well.  Pts are asked to imagine 3 pillars that hold them up when they are not able to hold themselves up.  Participation Level:  Did not attend   Dione Housekeeper 03/10/2020
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
## 2020-03-10 NOTE — Tx Team (Signed)
Initial Treatment Plan 03/10/2020 3:03 AM [place] Kister [id number]    PATIENT STRESSORS: Medication change or noncompliance   PATIENT STRENGTHS: Ability for insight Average or above average intelligence Work skills   PATIENT IDENTIFIED PROBLEMS: SI  Anxiety"        "I want to be able to control my emotions."           DISCHARGE CRITERIA:  Improved stabilization in mood, thinking, and/or behavior  PRELIMINARY DISCHARGE PLAN: Return to previous living arrangement  PATIENT/FAMILY INVOLVEMENT: This treatment plan has been presented to and reviewed with the patient, [place] Wholey, and/or family member.  The patient and family have been given the opportunity to ask questions and make suggestions.  Floyce Stakes, RN 03/10/2020, 3:03 AM

## 2020-03-10 NOTE — Progress Notes (Signed)
   03/10/20 2140  Psych Admission Type (Psych Patients Only)  Admission Status Involuntary  Psychosocial Assessment  Patient Complaints Anxiety  Eye Contact Brief  Facial Expression Animated  Affect Appropriate to circumstance  Speech Logical/coherent  Interaction Assertive  Motor Activity Other (Comment) (pt in bed)  Appearance/Hygiene Unremarkable  Behavior Characteristics Cooperative  Mood Anxious  Thought Process  Coherency WDL  Content WDL  Delusions None reported or observed  Perception WDL  Hallucination None reported or observed  Judgment Poor  Confusion None  Danger to Self  Current suicidal ideation? Denies  Danger to Others  Danger to Others None reported or observed

## 2020-03-10 NOTE — Progress Notes (Signed)
Per information collected by the pt, Clinical research associate called [facility] pharmacy on [address] in [place] to verify the pt's home medications. Per the pharmacy technician, the pt did not have any psychotropics on file. Writer notified Julieanne Cotton, NP.
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
## 2020-03-10 NOTE — Progress Notes (Signed)
Pt c/o not feeling well this evening and reported it to the MHT on the hall. Writer examined the pt and he reported vomiting, feeling nauseated, hot flashes and sweats. Vital signs assessed, B/P 108/79, HR 55, O2 99%, and Temp 98.1. Writer notified Dr. Gerarda Fraction. The pt was offered Zofran and offered to be assessed by Dr. Gerarda Fraction. He denied needing to see a doctor and agreed to taking the Zofran. He stated that he may be having these symptoms from starting the new medications today, Abilify and Lexapro. Fluids provided as well.

## 2020-03-10 NOTE — Progress Notes (Signed)
Pt presents with a flat affect and depressed mood. He reported that he's been having mood swings for a while now and he's unable to maintain control. He reported feeling happy one minute and then quickly becomes angry. He reported difficulty sleeping at night and reported always having racing thoughts. He denies SI/HI today. He denies AVH.  Orders reviewed. Vital signs reviewed. Verbal support provided. Pt encouraged to attend groups. 15 minute checks performed for safety.    Pt receptive to treatment.

## 2020-03-11 DIAGNOSIS — F3160 Bipolar disorder, current episode mixed, unspecified: Principal | ICD-10-CM

## 2020-03-11 LAB — GC/CHLAMYDIA PROBE AMP (~~LOC~~) NOT AT ARMC
Chlamydia: NEGATIVE
Comment: NEGATIVE
Comment: NORMAL
Neisseria Gonorrhea: NEGATIVE

## 2020-03-11 MED ORDER — GABAPENTIN 100 MG PO CAPS
100.0000 mg | ORAL_CAPSULE | Freq: Three times a day (TID) | ORAL | Status: DC
  Administered 2020-03-11: 100 mg via ORAL
  Filled 2020-03-11 (×3): qty 1

## 2020-03-11 MED ORDER — ONDANSETRON HCL 4 MG PO TABS
8.0000 mg | ORAL_TABLET | Freq: Three times a day (TID) | ORAL | Status: DC | PRN
  Administered 2020-03-11: 8 mg via ORAL
  Filled 2020-03-11: qty 2

## 2020-03-11 MED ORDER — LORAZEPAM 1 MG PO TABS
1.0000 mg | ORAL_TABLET | Freq: Four times a day (QID) | ORAL | Status: DC | PRN
  Administered 2020-03-12 – 2020-03-13 (×3): 1 mg via ORAL
  Filled 2020-03-11 (×3): qty 1

## 2020-03-11 MED ORDER — TRAZODONE HCL 50 MG PO TABS
50.0000 mg | ORAL_TABLET | Freq: Every evening | ORAL | Status: DC | PRN
  Administered 2020-03-11 – 2020-03-12 (×2): 50 mg via ORAL
  Filled 2020-03-11: qty 1
  Filled 2020-03-11: qty 7
  Filled 2020-03-11: qty 1

## 2020-03-11 MED ORDER — GABAPENTIN 300 MG PO CAPS
300.0000 mg | ORAL_CAPSULE | Freq: Three times a day (TID) | ORAL | Status: DC
  Administered 2020-03-12 (×3): 300 mg via ORAL
  Filled 2020-03-11 (×7): qty 1

## 2020-03-11 NOTE — Progress Notes (Signed)
[facility] MD Progress Note  03/11/2020 1:11 PM Marc [name]  MRN:  [id number]  Subjective: Marc reports, "I came to the hospital because I was having manic episodes triggered by stress & sleeplessness. I have been struggling financially to save money to buy a house. Then, I was also focusing in caring for others, but myself. But, I feel like the manic episodes is decreasing. I feel more anxious today than depressed. I have made up my mind that when I get discharged this time, I will focus more on myself. I did sleep much last night. My medicines are making me nauseated".  Objective: Caucasian male, 24 years old with past Psychiatric hx of Bipolar disorder, Anxiety disorder and Depression a was admitted to the unit last night. He engaged in meaningful conversation with provider this morning. This is his third [facility] admission seeking help for mood swings, anxiety and depression. His last hospitalization in the unit was in 2019. He states that he has been having suicide ideation with plan to walk into traffic, he has been having problem at work concentrating and excessive anger. He reports that his lack of concentration is made worse by him thinking of how to harm himself. His paternal cousin committed suicide not long ago by hanging He admits that he has not been taking his prescribed medications as he should and that he uses Marijuana daily, three blunts a day to calm his mood. He states that he does not consider Marijuana a bad thing as it calms him down. He could not name any medication he has taken before and call to [facility] in [place] did not come up with any Psychotropic for this patient.  Marc is seen, chart reviewed. The chart findings discussed with the treatment team. He presents alert, but lying down in his bed. He is making a fair eye contact. He says he is doing better today that when he first got to the hospital. He says his mania has decreased quite a bit today. He says his  mania was caused by financial stress of trying to save up money to buy a house & having to take care of everyone else but himself. Marc says he is going to do things differently after discharge by focusing more on himself. He adds that he is actually more anxious than depressed. He says he did not sleep well last night. However, the documentation indicated he slept for 8 hrs last night. He complains of feeling nauseated. Encouraged patient to tell his nurse for some Zofran as it is ordered prn. Started Trazodone 50 mg po prn for insomnia & gabapentin 100 mg po tid for agitation/anxiety. Marc is encouraged to attend & participate in the group sessions for some coping skills. He currently denies any SIHI, AVH, delusional thoughts or paranoia. He does not appear to be responding to any internal stimuli.  Principal Problem: Bipolar 1 disorder, mixed (HCC)  Diagnosis:   Patient Active Problem List   Diagnosis Date Noted  . MDD (major depressive disorder), single episode, severe (HCC) [F32.2] 03/10/2020  . Bipolar 1 disorder, mixed (HCC) [F31.60] 03/10/2020  . Marijuana dependence (HCC) [F12.20] 03/10/2020  . Tobacco use disorder, moderate, dependence [F17.200] 03/10/2020  . Alcohol abuse [F10.10] 03/10/2020  . Substance induced mood disorder (HCC) [F19.94] 03/10/2020  . MDD (major depressive disorder), recurrent episode, severe (HCC) [F33.2] 04/05/2018  . MDD (major depressive disorder) [F32.9] 06/22/2017  . Major depressive disorder, recurrent severe without psychotic features (HCC) [F33.2] 06/21/2017  . Severe episode  of recurrent major depressive disorder, without psychotic features (HCC) [F33.2]   . Bipolar disorder, unspecified (HCC) [F31.9] 03/23/2017  . Depression, major, recurrent, in partial remission (HCC) [F33.41] 06/03/2011  . ELBOW SPRAIN, LEFT [[id number]] 09/17/2010   Total Time spent with patient: 25 minutes  Past Psychiatric History: See H&P  Past Medical History:  Past Medical  History:  Diagnosis Date  . Anxiety   . Asthma   . Depressed   . Polysubstance abuse (HCC)   . Seizures (HCC)   . Wears glasses     Past Surgical History:  Procedure Laterality Date  . COLON SURGERY    . FRACTURE SURGERY     Family History:  Family History  Problem Relation Age of Onset  . Anxiety disorder Father   . Depression Father   . Anxiety disorder Paternal Grandfather   . Anxiety disorder Mother   . Depression Mother   . Depression Cousin   . Drug abuse Cousin    Family Psychiatric  History: See H&P  Social History:  Social History   Substance and Sexual Activity  Alcohol Use Yes   Comment: one beer a week     Social History   Substance and Sexual Activity  Drug Use Yes  . Frequency: 7.0 times per week  . Types: Marijuana   Comment: couple blunts per day    Social History   Socioeconomic History  . Marital status: Single    Spouse name: Not on file  . Number of children: Not on file  . Years of education: 8 grade  . Highest education level: 8th grade  Occupational History  . Not on file  Tobacco Use  . Smoking status: Current Every Day Smoker    Packs/day: 1.00    Types: Cigarettes  . Smokeless tobacco: Never Used  Vaping Use  . Vaping Use: Never used  Substance and Sexual Activity  . Alcohol use: Yes    Comment: one beer a week  . Drug use: Yes    Frequency: 7.0 times per week    Types: Marijuana    Comment: couple blunts per day  . Sexual activity: Yes    Birth control/protection: None  Other Topics Concern  . Not on file  Social History Narrative   Lives with grandparents and other grandchildren who take drugs, abuse grandparents.   Social Determinants of Health   Financial Resource Strain:   . Difficulty of Paying Living Expenses: Not on file  Food Insecurity:   . Worried About Programme researcher, broadcasting/film/video in the Last Year: Not on file  . Ran Out of Food in the Last Year: Not on file  Transportation Needs:   . Lack of Transportation  (Medical): Not on file  . Lack of Transportation (Non-Medical): Not on file  Physical Activity:   . Days of Exercise per Week: Not on file  . Minutes of Exercise per Session: Not on file  Stress: Stress Concern Present  . Feeling of Stress : Very much  Social Connections:   . Frequency of Communication with Friends and Family: Not on file  . Frequency of Social Gatherings with Friends and Family: Not on file  . Attends Religious Services: Not on file  . Active Member of Clubs or Organizations: Not on file  . Attends Banker Meetings: Not on file  . Marital Status: Not on file   Additional Social History:   Sleep: 8 hrs per documentation- .  Appetite:  Fair  Current Medications:  Current Facility-Administered Medications  Medication Dose Route Frequency Provider Last Rate Last Admin  . acetaminophen (TYLENOL) tablet 650 mg  650 mg Oral Q6H PRN Gillermo Murdoch, NP   650 mg at 03/10/20 1536  . alum & mag hydroxide-simeth (MAALOX/MYLANTA) 200-200-20 MG/5ML suspension 30 mL  30 mL Oral Q4H PRN Gillermo Murdoch, NP      . escitalopram (LEXAPRO) tablet 10 mg  10 mg Oral Daily Onuoha, Josephine C, NP   10 mg at 03/11/20 0817  . hydrOXYzine (ATARAX/VISTARIL) tablet 10 mg  10 mg Oral TID PRN Dahlia Byes C, NP   10 mg at 03/10/20 1535  . LORazepam (ATIVAN) tablet 1 mg  1 mg Oral Q6H PRN Antonieta Pert, MD      . magnesium hydroxide (MILK OF MAGNESIA) suspension 30 mL  30 mL Oral Daily PRN Gillermo Murdoch, NP      . ondansetron [facility]) tablet 8 mg  8 mg Oral Q8H PRN Antonieta Pert, MD   8 mg at 03/11/20 2482    Lab Results:  Results for orders placed or performed during the hospital encounter of 03/10/20 (from the past 48 hour(s))  CBC     Status: None   Collection Time: 03/10/20  6:31 AM  Result Value Ref Range   WBC 7.5 4.0 - 10.5 K/uL   RBC 4.75 4.22 - 5.81 MIL/uL   Hemoglobin 15.4 13.0 - 17.0 g/dL   HCT 50.0 39 - 52 %   MCV 95.6 80.0 - 100.0  fL   MCH 32.4 26.0 - 34.0 pg   MCHC 33.9 30.0 - 36.0 g/dL   RDW 37.0 48.8 - 89.1 %   Platelets 209 150 - 400 K/uL   nRBC 0.0 0.0 - 0.2 %    Comment: Performed at [facility], [address] [place], [place] [address]  Comprehensive metabolic panel     Status: None   Collection Time: 03/10/20  6:31 AM  Result Value Ref Range   Sodium 138 135 - 145 mmol/L   Potassium 4.2 3.5 - 5.1 mmol/L   Chloride 105 98 - 111 mmol/L   CO2 24 22 - 32 mmol/L   Glucose, Bld 93 70 - 99 mg/dL    Comment: Glucose reference range applies only to samples taken after fasting for at least 8 hours.   BUN 17 6 - 20 mg/dL   Creatinine, Ser 3.88 0.61 - 1.24 mg/dL   Calcium 9.3 8.9 - 82.8 mg/dL   Total Protein 7.1 6.5 - 8.1 g/dL   Albumin 4.2 3.5 - 5.0 g/dL   AST 25 15 - 41 U/L   ALT 32 0 - 44 U/L   Alkaline Phosphatase 41 38 - 126 U/L   Total Bilirubin 0.8 0.3 - 1.2 mg/dL   GFR calc non Af Amer >60 >60 mL/min   GFR calc Af Amer >60 >60 mL/min   Anion gap 9 5 - 15    Comment: Performed at [facility], [address] [place], [place] [address]  Magnesium     Status: None   Collection Time: 03/10/20  6:31 AM  Result Value Ref Range   Magnesium 2.3 1.7 - 2.4 mg/dL    Comment: Performed at [facility], [address] [place], [place] [address]  Ethanol     Status: None   Collection Time: 03/10/20  6:31 AM  Result Value Ref Range   Alcohol, Ethyl (B) <10 <10 mg/dL    Comment: (  NOTE) Lowest detectable limit for serum alcohol is 10 mg/dL.  For medical purposes only. Performed at [facility], [address], [place], [place] [address]   Lipid panel     Status: Abnormal   Collection Time: 03/10/20  6:31 AM  Result Value Ref Range   Cholesterol 139 0 - 200 mg/dL   Triglycerides 130 <865 mg/dL   HDL 40 (L) >78 mg/dL   Total CHOL/HDL Ratio 3.5 RATIO   VLDL 27 0 - 40 mg/dL   LDL Cholesterol 72 0 - 99 mg/dL    Comment:         Total Cholesterol/HDL:CHD Risk Coronary Heart Disease Risk Table                     Men   Women  1/2 Average Risk   3.4   3.3  Average Risk       5.0   4.4  2 X Average Risk   9.6   7.1  3 X Average Risk  23.4   11.0        Use the calculated Patient Ratio above and the CHD Risk Table to determine the patient's CHD Risk.        ATP III CLASSIFICATION (LDL):  <100     mg/dL   Optimal  469-629  mg/dL   Near or Above                    Optimal  130-159  mg/dL   Borderline  528-413  mg/dL   High  >244     mg/dL   Very High Performed at [facility], [address] [place], [place] [address]   Hepatic function panel     Status: None   Collection Time: 03/10/20  6:31 AM  Result Value Ref Range   Total Protein 7.5 6.5 - 8.1 g/dL   Albumin 4.5 3.5 - 5.0 g/dL   AST 24 15 - 41 U/L   ALT 29 0 - 44 U/L   Alkaline Phosphatase 44 38 - 126 U/L   Total Bilirubin 1.0 0.3 - 1.2 mg/dL   Bilirubin, Direct 0.1 0.0 - 0.2 mg/dL   Indirect Bilirubin 0.9 0.3 - 0.9 mg/dL    Comment: Performed at [facility], [address], [place], [place] [address]  TSH     Status: None   Collection Time: 03/10/20  6:31 AM  Result Value Ref Range   TSH 2.389 0.350 - 4.500 uIU/mL    Comment: Performed by a 3rd Generation assay with a functional sensitivity of <=0.01 uIU/mL. Performed at [facility], [address], [place], [place] [address]    Blood Alcohol level:  Lab Results  Component Value Date   [facility] <10 03/10/2020   ETH <10 03/09/2020   Metabolic Disorder Labs: Lab Results  Component Value Date   HGBA1C 5.6 04/28/2011   MPG 114 04/28/2011   No results found for: PROLACTIN Lab Results  Component Value Date   CHOL 139 03/10/2020   TRIG 134 03/10/2020   HDL 40 (L) 03/10/2020   CHOLHDL 3.5 03/10/2020   VLDL 27 03/10/2020   LDLCALC 72 03/10/2020   LDLCALC 81 04/28/2011   Physical Findings: AIMS: Facial and Oral Movements Muscles  of Facial Expression: None, normal Lips and Perioral Area: None, normal Jaw: None, normal Tongue: None, normal,Extremity Movements Upper (arms, wrists, hands, fingers): None, normal Lower (legs, knees, ankles, toes): None,  normal, Trunk Movements Neck, shoulders, hips: None, normal, Overall Severity Severity of abnormal movements (highest score from questions above): None, normal Incapacitation due to abnormal movements: None, normal Patient's awareness of abnormal movements (rate only patient's report): No Awareness, Dental Status Current problems with teeth and/or dentures?: No Does patient usually wear dentures?: No  CIWA:    COWS:     Musculoskeletal: Strength & Muscle Tone: within normal limits Gait & Station: normal Patient leans: N/A  Psychiatric Specialty Exam: Physical Exam Vitals and nursing note reviewed.  HENT:     Head: Normocephalic.     Nose: Nose normal.     Mouth/Throat:     Pharynx: Oropharynx is clear.  Eyes:     Pupils: Pupils are equal, round, and reactive to light.  Cardiovascular:     Rate and Rhythm: Normal rate.     Pulses: Normal pulses.  Pulmonary:     Effort: Pulmonary effort is normal.  Genitourinary:    Comments: Deferred Musculoskeletal:        General: Normal range of motion.     Cervical back: Normal range of motion.  Skin:    General: Skin is warm and dry.  Neurological:     Mental Status: He is alert and oriented to person, place, and time.     Review of Systems  Constitutional: Negative for chills, fever and malaise/fatigue.  HENT: Negative for congestion and sore throat.   Eyes: Negative for blurred vision.  Respiratory: Negative for cough, shortness of breath and wheezing.   Cardiovascular: Negative for chest pain, palpitations and leg swelling.  Gastrointestinal: Positive for abdominal pain, heartburn, nausea and vomiting. Negative for diarrhea.  Genitourinary: Negative for dysuria.  Musculoskeletal: Negative.   Skin:  Negative for itching and rash.  Neurological: Negative for dizziness, tingling, tremors, sensory change, speech change, focal weakness, seizures, weakness and headaches.  Endo/Heme/Allergies: Negative for environmental allergies. Does not bruise/bleed easily.       Allergies: ASA, Promethazine.   Psychiatric/Behavioral: Positive for depression and substance abuse (THC use disorder). Negative for hallucinations, memory loss and suicidal ideas. The patient is nervous/anxious. The patient does not have insomnia.      Blood pressure 118/88, pulse (!) 56, temperature 98.6 F (37 C), temperature source Oral, resp. rate 16, height 5\' 5"  (1.651 m), weight 54.4 kg, SpO2 99 %.Body mass index is 19.97 kg/m.  General Appearance: Fairly Groomed  Eye Contact:  Fair  Speech:  Normal Rate  Volume:  Normal  Mood:  "I'm more anxious than depressed today"  Affect:  Flat  Thought Process:  Coherent and Descriptions of Associations: Intact  Orientation:  Full (Time, Place, and Person)  Thought Content:  Denies any hallucinations, delusions or paranoia, not internally preoccupied   Suicidal Thoughts:  Denies , denies self injurious ideations at this time.  Homicidal Thoughts:  Denies  Memory:  Immediate;   Good Recent;   Good Remote;   Good  Judgement:  Fair  Insight:  Fair  Psychomotor Activity:  Decreased  Concentration:  Concentration: Good and Attention Span: Good  Recall:  Good  Fund of Knowledge:  Good  Language:  Good  Akathisia:  Negative  Handed:  Right  AIMS (if indicated):     Assets:  Desire for Improvement Resilience  ADL's:  Intact  Cognition:  WNL  Sleep:  Number of Hours: 8   Treatment Plan Summary: Daily contact with patient to assess and evaluate symptoms and progress in treatment and Medication management.  - Continue  inpatient hospitalization. - Will continue today 03/11/2020 plan as below except where it is noted.  Depression. Continue Lexapro 10 mg po  daily.  Anxiety.. Continue Vistaril 25 mg po TID prn. Continue Lorazepam 1 mg po Q 6 hrs prn for anxiety/CIWA > 10. Initiated Gabapentin 100 mg po tid for agitation.  Insomnia. Initiated Trazodone 50 mg po Q hs prn.  Encourage group participation. Discharge disposition plan in progress.  Armandina StammerAgnes Haeven Nickle, NP, PMHNP, FNP-BC 03/11/2020, 1:11 PMPatient ID: [name], male   DOB: 06/07/1996, 24 y.o.   MRN: [id number]

## 2020-03-11 NOTE — Tx Team (Signed)
Interdisciplinary Treatment and Diagnostic Plan Update  03/11/2020 Time of Session: 9:45am Marc [name] MRN: [id number]  Principal Diagnosis: Bipolar 1 disorder, mixed ([place])  Secondary Diagnoses: Principal Problem:   Bipolar 1 disorder, mixed ([place]) Active Problems:   Marijuana dependence ([place])   Tobacco use disorder, moderate, dependence   Alcohol abuse   Substance induced mood disorder ([place])   Current Medications:  Current Facility-Administered Medications  Medication Dose Route Frequency Provider Last Rate Last Admin  . acetaminophen (TYLENOL) tablet 650 mg  650 mg Oral Q6H PRN Caroline Sauger, NP   650 mg at 03/10/20 1536  . alum & mag hydroxide-simeth (MAALOX/MYLANTA) 200-200-20 MG/5ML suspension 30 mL  30 mL Oral Q4H PRN Caroline Sauger, NP      . escitalopram (LEXAPRO) tablet 10 mg  10 mg Oral Daily Onu[place], Josephine C, NP   10 mg at 03/11/20 0817  . hydrOXYzine (ATARAX/VISTARIL) tablet 10 mg  10 mg Oral TID PRN Charmaine Downs C, NP   10 mg at 03/10/20 1535  . LORazepam (ATIVAN) tablet 1 mg  1 mg Oral Q6H PRN Sharma Covert, MD      . magnesium hydroxide (MILK OF MAGNESIA) suspension 30 mL  30 mL Oral Daily PRN Caroline Sauger, NP      . ondansetron [facility]) tablet 8 mg  8 mg Oral Q8H PRN Sharma Covert, MD   8 mg at 03/11/20 7425   PTA Medications: Medications Prior to Admission  Medication Sig Dispense Refill Last Dose  . cephALEXin (KEFLEX) 500 MG capsule Take 1 capsule (500 mg total) by mouth 4 (four) times daily. 28 capsule 0   . EPINEPHrine (EPIPEN 2-PAK) 0.3 mg/0.3 mL IJ SOAJ injection Inject 0.3 mLs (0.3 mg total) into the muscle as needed for anaphylaxis (For severe allergic reaction with difficulty breathing, difficulty swallowing, tongue or lip swelling). 1 each 0     Patient Stressors: Medication change or noncompliance  Patient Strengths: Ability for insight Average or above average intelligence Work skills  Treatment  Modalities: Medication Management, Group therapy, Case management,  1 to 1 session with clinician, Psychoeducation, Recreational therapy.   Physician Treatment Plan for Primary Diagnosis: Bipolar 1 disorder, mixed ([place]) Long Term Goal(s): Improvement in symptoms so as ready for discharge Improvement in symptoms so as ready for discharge   Short Term Goals: Ability to identify changes in lifestyle to reduce recurrence of condition will improve Ability to verbalize feelings will improve Ability to disclose and discuss suicidal ideas Ability to demonstrate self-control will improve Ability to identify and develop effective coping behaviors will improve Ability to maintain clinical measurements within normal limits will improve Compliance with prescribed medications will improve Ability to identify triggers associated with substance abuse/mental health issues will improve Ability to identify changes in lifestyle to reduce recurrence of condition will improve Ability to verbalize feelings will improve Ability to disclose and discuss suicidal ideas Ability to demonstrate self-control will improve Ability to identify and develop effective coping behaviors will improve Ability to maintain clinical measurements within normal limits will improve Compliance with prescribed medications will improve Ability to identify triggers associated with substance abuse/mental health issues will improve  Medication Management: Evaluate patient's response, side effects, and tolerance of medication regimen.  Therapeutic Interventions: 1 to 1 sessions, Unit Group sessions and Medication administration.  Evaluation of Outcomes: Not Met  Physician Treatment Plan for Secondary Diagnosis: Principal Problem:   Bipolar 1 disorder, mixed ([place]) Active Problems:   Marijuana dependence ([place])   Tobacco use  disorder, moderate, dependence   Alcohol abuse   Substance induced mood disorder ([place])  Long Term Goal(s):  Improvement in symptoms so as ready for discharge Improvement in symptoms so as ready for discharge   Short Term Goals: Ability to identify changes in lifestyle to reduce recurrence of condition will improve Ability to verbalize feelings will improve Ability to disclose and discuss suicidal ideas Ability to demonstrate self-control will improve Ability to identify and develop effective coping behaviors will improve Ability to maintain clinical measurements within normal limits will improve Compliance with prescribed medications will improve Ability to identify triggers associated with substance abuse/mental health issues will improve Ability to identify changes in lifestyle to reduce recurrence of condition will improve Ability to verbalize feelings will improve Ability to disclose and discuss suicidal ideas Ability to demonstrate self-control will improve Ability to identify and develop effective coping behaviors will improve Ability to maintain clinical measurements within normal limits will improve Compliance with prescribed medications will improve Ability to identify triggers associated with substance abuse/mental health issues will improve     Medication Management: Evaluate patient's response, side effects, and tolerance of medication regimen.  Therapeutic Interventions: 1 to 1 sessions, Unit Group sessions and Medication administration.  Evaluation of Outcomes: Not Met   RN Treatment Plan for Primary Diagnosis: Bipolar 1 disorder, mixed ([place]) Long Term Goal(s): Knowledge of disease and therapeutic regimen to maintain health will improve  Short Term Goals: Ability to remain free from injury will improve, Ability to verbalize frustration and anger appropriately will improve, Ability to identify and develop effective coping behaviors will improve and Compliance with prescribed medications will improve  Medication Management: RN will administer medications as ordered by provider,  will assess and evaluate patient's response and provide education to patient for prescribed medication. RN will report any adverse and/or side effects to prescribing provider.  Therapeutic Interventions: 1 on 1 counseling sessions, Psychoeducation, Medication administration, Evaluate responses to treatment, Monitor vital signs and CBGs as ordered, Perform/monitor CIWA, COWS, AIMS and Fall Risk screenings as ordered, Perform wound care treatments as ordered.  Evaluation of Outcomes: Not Met   LCSW Treatment Plan for Primary Diagnosis: Bipolar 1 disorder, mixed ([place]) Long Term Goal(s): Safe transition to appropriate next level of care at discharge, Engage patient in therapeutic group addressing interpersonal concerns.  Short Term Goals: Engage patient in aftercare planning with referrals and resources, Increase social support, Identify triggers associated with mental health/substance abuse issues and Increase skills for wellness and recovery  Therapeutic Interventions: Assess for all discharge needs, 1 to 1 time with Social worker, Explore available resources and support systems, Assess for adequacy in community support network, Educate family and significant other(s) on suicide prevention, Complete Psychosocial Assessment, Interpersonal group therapy.  Evaluation of Outcomes: Not Met   Progress in Treatment: Attending groups: Yes. Participating in groups: Yes. Taking medication as prescribed: Yes. Toleration medication: Yes. Family/Significant other contact made: No, will contact:  mother. Patient understands diagnosis: Yes. Discussing patient identified problems/goals with staff: Yes. Medical problems stabilized or resolved: Yes. Denies suicidal/homicidal ideation: Yes. Issues/concerns per patient self-inventory: No.   New problem(s) identified: No, Describe:  none.  New Short Term/Long Term Goal(s): medication stabilization, elimination of SI thoughts, development of comprehensive  mental wellness plan.   Patient Goals:  "To control my emotions"  Discharge Plan or Barriers: Plans to receive outpatient medication management and therapy through [facility]  Reason for Continuation of Hospitalization: Anxiety Depression Mania Medication stabilization  Estimated Length of Stay: 3-5 days  Attendees: Patient: Marc [name] 03/11/2020   Physician: Myles Lipps, MD 03/11/2020   Nursing:  03/11/2020   RN Care Manager: 03/11/2020   Social Worker: Darletta Moll, LCSW 03/11/2020   Recreational Therapist:  03/11/2020   Other:  03/11/2020   Other:  03/11/2020   Other: 03/11/2020     Scribe for Treatment Team: Vassie Moselle, LCSW 03/11/2020 10:16 AM

## 2020-03-11 NOTE — Progress Notes (Signed)
Nutrition Brief Note RD working remotely.  Patient identified on the Malnutrition Screening Tool (MST) Report  Wt Readings from Last 15 Encounters:  03/10/20 54.4 kg  03/09/20 54.4 kg  02/28/20 52.2 kg  02/27/20 56.7 kg  02/24/20 54.4 kg  12/30/19 54.4 kg  06/19/19 52.2 kg  05/10/19 54.4 kg  03/07/19 55.8 kg  02/08/19 56.7 kg  10/03/18 56.7 kg  04/05/18 57.2 kg  04/04/18 59 kg  03/28/18 56.7 kg  02/19/18 59 kg    Body mass index is 19.97 kg/m. Patient meets criteria for normal weight based on current BMI.   He was admitted for severe anxiety and mania.  Current diet order is Regular and patient is eating as desired for meals and snacks at this time. Labs and medications reviewed.   No nutrition interventions warranted at this time. If nutrition issues arise, please consult RD.      Trenton Gammon, MS, RD, LDN, CNSC Inpatient Clinical Dietitian RD pager # available in [facility]  After hours/weekend pager # available in [facility]

## 2020-03-11 NOTE — BHH Counselor (Signed)
Spoke with pt about disposition. He is okay with outpatient referral to [facility] but does not want Dr. Billy Fischer. States he does not like the medications he has received so far at [facility] as they make him feel to tired and sick. He is ambivalent about continued medication but open to it if on a better tolerated med. Plans to return to live with mom. CSW inquired about relationship problems. Pt states he has been manic before in the past and that's what triggered problems. States he has never taken it out on his girlfriend and feels he can manage not doing so in the future. He does not live with his girlfriend.

## 2020-03-11 NOTE — Progress Notes (Signed)
Recreation Therapy Notes  Date: 8.23.21 Time: 0930 Location: 300 Hall Group Room  Group Topic: Stress Management  Goal Area(s) Addresses:  Patient will identify positive stress management techniques. Patient will identify benefits of using stress management post d/c.  Intervention: Stress Management  Activity:  Meditation.  LRT played a meditation that focused on being resilient in the face of adversity.  Patients were to listen and follow along as meditation played.    Education:  Stress Management, Discharge Planning.   Education Outcome: Acknowledges Education  Clinical Observations/Feedback: Pt did not attend group activity.    Doris Gruhn, LRT/CTRS         Janeil Schexnayder A 03/11/2020 11:47 AM 

## 2020-03-11 NOTE — Progress Notes (Signed)
Pt denied SI/HI/AVH.  Pt c/o nausea.  RN administered Zofran which pt tolerated well and pt's nausea has decreased.  Pt declined 1345 order for Gabapentin and stated that he would take dose at 1700.  RN notified NP about pt not taking 1345 dose.  Pt has been isolative to his room and sleeping on and off during the day. RN will continue to monitor and provide assistance as needed.

## 2020-03-11 NOTE — Progress Notes (Signed)
ADULT GRIEF GROUP NOTE:  Spiritual care group on grief and loss facilitated by chaplain Brandilee Pies MDiv, BCC  Group Goal:  Support / Education around grief and loss Members engage in facilitated group support and psycho-social education.  Group Description:  Following introductions and group rules, group members engaged in facilitated group dialog and support around topic of loss, with particular support around experiences of loss in their lives. Group Identified types of loss (relationships / self / things) and identified patterns, circumstances, and changes that precipitate losses. Reflected on thoughts / feelings around loss, normalized grief responses, and recognized variety in grief experience.   Group noted Worden's four tasks of grief in discussion.  Group drew on Adlerian / Rogerian, narrative, MI, Patient Progress:  

## 2020-03-11 NOTE — Progress Notes (Signed)
   03/11/20 2155  Psych Admission Type (Psych Patients Only)  Admission Status Involuntary  Psychosocial Assessment  Patient Complaints Anxiety;Depression  Eye Contact Brief  Facial Expression Anxious  Affect Appropriate to circumstance  Speech Logical/coherent  Interaction Assertive  Motor Activity Slow  Appearance/Hygiene Unremarkable  Behavior Characteristics Cooperative;Anxious  Mood Depressed;Anxious  Thought Process  Coherency WDL  Content WDL  Delusions None reported or observed  Perception WDL  Hallucination None reported or observed  Judgment Poor  Confusion None  Danger to Self  Current suicidal ideation? Denies  Danger to Others  Danger to Others None reported or observed   Pt reports pain 5/10 (headache); anxiety 10/10 and depression 6/10. "I wake up with anxiety and go to be with anxiety. My heart is always racing when I get up." Pt was given Vistaril for his anxiety.

## 2020-03-11 NOTE — B[facility] Suicide Risk Assessment (Signed)
B[facility] INPATIENT:  Family/Significant Other Suicide Prevention Education  Suicide Prevention Education:  Education Completed; [place]lyn Mans Mother [phone],  (name of family member/significant other) has been identified by the patient as the family member/significant other with whom the patient will be residing, and identified as the person(s) who will aid the patient in the event of a mental health crisis (suicidal ideations/suicide attempt).  With written consent from the patient, the family member/significant other has been provided the following suicide prevention education, prior to the and/or following the discharge of the patient.  The suicide prevention education provided includes the following:  Suicide risk factors  Suicide prevention and interventions  National Suicide Hotline telephone number  [facility] assessment telephone number  [facility] Emergency Assistance 911  [facility] and/or Residential Mobile Crisis Unit telephone number  Request made of family/significant other to:  Remove weapons (e.g., guns, rifles, knives), all items previously/currently identified as safety concern.    Remove drugs/medications (over-the-counter, prescriptions, illicit drugs), all items previously/currently identified as a safety concern.  The family member/significant other verbalizes understanding of the suicide prevention education information provided.  The family member/significant other agrees to remove the items of safety concern listed above.  Mom states that pt can return home. States he has a good job and she is bringing paperwork to [employer] to help keep his job. She is concerned about his mania and that he has been aggressive for the first time. She is supportive of him receiving outpatient services. She expresses concern that his relationship with his girlfriend is toxic. She states both of their behaviors are toxic. She explains that pt lives with just her and  that girlfriend lives separately elsewhere in [place]. States there are no weapons in the home. Primary concern for pt to get outpatient tx.   Erin Sons 03/11/2020, 10:41 AM

## 2020-03-12 MED ORDER — ESCITALOPRAM OXALATE 10 MG PO TABS
10.0000 mg | ORAL_TABLET | Freq: Once | ORAL | Status: AC
  Administered 2020-03-12: 10 mg via ORAL
  Filled 2020-03-12 (×2): qty 1

## 2020-03-12 MED ORDER — NICOTINE 21 MG/24HR TD PT24
21.0000 mg | MEDICATED_PATCH | Freq: Every day | TRANSDERMAL | Status: DC
  Administered 2020-03-12 – 2020-03-13 (×2): 21 mg via TRANSDERMAL
  Filled 2020-03-12 (×2): qty 1

## 2020-03-12 MED ORDER — GABAPENTIN 400 MG PO CAPS
400.0000 mg | ORAL_CAPSULE | Freq: Three times a day (TID) | ORAL | Status: DC
  Administered 2020-03-13: 400 mg via ORAL
  Filled 2020-03-12 (×5): qty 1

## 2020-03-12 MED ORDER — CARBAMAZEPINE 100 MG PO CHEW
100.0000 mg | CHEWABLE_TABLET | Freq: Two times a day (BID) | ORAL | Status: DC
  Administered 2020-03-12 – 2020-03-13 (×2): 100 mg via ORAL
  Filled 2020-03-12 (×6): qty 1

## 2020-03-12 MED ORDER — ESCITALOPRAM OXALATE 20 MG PO TABS
20.0000 mg | ORAL_TABLET | Freq: Every day | ORAL | Status: DC
  Administered 2020-03-13: 20 mg via ORAL
  Filled 2020-03-12 (×2): qty 1
  Filled 2020-03-12: qty 2

## 2020-03-12 NOTE — Progress Notes (Signed)
Recreation Therapy Notes  Animal-Assisted Activity (AAA) Program Checklist/Progress Notes Patient Eligibility Criteria Checklist & Daily Group note for Rec Tx Intervention  Date: 8.24.21 Time: 1430 Location: 300 Hall Group Room   AAA/T Program Assumption of Risk Form signed by Patient/ or Parent Legal Guardian  YES  Patient is free of allergies or sever asthma  YES   Patient reports no fear of animals YES   Patient reports no history of cruelty to animals  YES  Patient understands his/her participation is voluntary YES  Patient washes hands before animal contact  YES   Patient washes hands after animal contact  YES   Behavioral Response:  Engaged  Education: Charity fundraiser, Appropriate Animal Interaction   Education Outcome: Acknowledges understanding/In group clarification offered/Needs additional education.   Clinical Observations/Feedback:  Pt attended and participated in activity.   Caroll Rancher, LRT/CTRS    Caroll Rancher A 03/12/2020 3:26 PM

## 2020-03-12 NOTE — Progress Notes (Signed)
Pt denies SI/HI/AVH.  Pt reports feeling anxious and prn vistaril given.  An hour later pt reported that his anxiety has increased and he said "my anxiety is just building and building, I feel jittery."  PRN ativan administered which was effective.  Pt disclosed that he took Ritalin as a child for ADHD and said "the Ritalin made me do nothing but cry" so the Ritalin was discontinued.   Pt reported that he has an extremely hard time concentrating and pt's inability to focus, pt believes, is the major cause of his anxiety issues.   Pt took medications without incident.  RN provided reassurance and support.  Q 15 min safety checks remain in place.

## 2020-03-12 NOTE — Progress Notes (Signed)
[facility]  03/12/2020 5:06 PM Marc [name]  MRN:  [id number] Subjective: Patient is a 24 year old male with a past psychiatric history significant for bipolar depression versus major depression who was admitted to the psychiatric facility on 03/10/2020 secondary to mood swings, anxiety, depression.  This is his third psychiatric admission to our facility.  He admitted to problems with concentration as well as excessive anger.   Objective: Patient is seen and examined.  Patient is a 24 year old male with the above-stated past psychiatric history who seen in follow-up.  Unfortunately yesterday he developed significant nausea and vomiting.  He remained in bed most the day.  We stopped the Abilify having concerned that that was the cause, and also he did have a history in the past of abusing benzodiazepines, and he was placed on lorazepam 1 mg p.o. every 6 hours as needed 80 CIWA greater than 10.  He stated that after the Abilify was stopped his nausea resolved.  He stated today from a physical standpoint he felt well.  On examination today he still remains pressured, somewhat disorganized.  He stated that this is been one of the major problems for him.  He stated he has not been treated adequately for this.  He stated he had not been treated with lithium, Depakote or Tegretol in the past.  He had been prescribed gabapentin, but also has chronic pain problems.  His current medications include Lexapro, gabapentin, hydroxyzine, lorazepam as needed, trazodone.  His blood pressures been slightly elevated today.  The most recent pressure was 122/95.  Pulse was 83.  He is afebrile.  He did sleep 6.75 hours last night.  Review of his laboratories revealed essentially normal electrolytes including liver function enzymes.  His lipid panel was normal.  CBC was normal.  Acetaminophen was less than 10, salicylate less than 7.  TSH was normal at 2.389.  Drug screen was positive for marijuana.  He denied any  suicidal or homicidal ideation.   Principal Problem: Bipolar 1 disorder, mixed (HCC) Diagnosis: Principal Problem:   Bipolar 1 disorder, mixed (HCC) Active Problems:   Marijuana dependence (HCC)   Tobacco use disorder, moderate, dependence   Alcohol abuse   Substance induced mood disorder (HCC)  Total Time spent with patient: 20 minutes  Past Psychiatric History: See admission H&P  Past Medical History:  Past Medical History:  Diagnosis Date  . Anxiety   . Asthma   . Depressed   . Polysubstance abuse (HCC)   . Seizures (HCC)   . Wears glasses     Past Surgical History:  Procedure Laterality Date  . COLON SURGERY    . FRACTURE SURGERY     Family History:  Family History  Problem Relation Age of Onset  . Anxiety disorder Father   . Depression Father   . Anxiety disorder Paternal Grandfather   . Anxiety disorder Mother   . Depression Mother   . Depression Cousin   . Drug abuse Cousin    Family Psychiatric  History: Admission H&P Social History:  Social History   Substance and Sexual Activity  Alcohol Use Yes   Comment: one beer a week     Social History   Substance and Sexual Activity  Drug Use Yes  . Frequency: 7.0 times per week  . Types: Marijuana   Comment: couple blunts per day    Social History   Socioeconomic History  . Marital status: Single    Spouse name: Not on file  .  Number of children: Not on file  . Years of education: 8 grade  . Highest education level: 8th grade  Occupational History  . Not on file  Tobacco Use  . Smoking status: Current Every Day Smoker    Packs/day: 1.00    Types: Cigarettes  . Smokeless tobacco: Never Used  Vaping Use  . Vaping Use: Never used  Substance and Sexual Activity  . Alcohol use: Yes    Comment: one beer a week  . Drug use: Yes    Frequency: 7.0 times per week    Types: Marijuana    Comment: couple blunts per day  . Sexual activity: Yes    Birth control/protection: None  Other Topics Concern   . Not on file  Social History Narrative   Lives with grandparents and other grandchildren who take drugs, abuse grandparents.   Social Determinants of Health   Financial Resource Strain:   . Difficulty of Paying Living Expenses: Not on file  Food Insecurity:   . Worried About Programme researcher, broadcasting/film/video in the Last Year: Not on file  . Ran Out of Food in the Last Year: Not on file  Transportation Needs:   . Lack of Transportation (Medical): Not on file  . Lack of Transportation (Non-Medical): Not on file  Physical Activity:   . Days of Exercise per Week: Not on file  . Minutes of Exercise per Session: Not on file  Stress: Stress Concern Present  . Feeling of Stress : Very much  Social Connections:   . Frequency of Communication with Friends and Family: Not on file  . Frequency of Social Gatherings with Friends and Family: Not on file  . Attends Religious Services: Not on file  . Active Member of Clubs or Organizations: Not on file  . Attends Banker Meetings: Not on file  . Marital Status: Not on file   Additional Social History:                         Sleep: Fair  Appetite:  Fair  Current Medications: Current Facility-Administered Medications  Medication Dose Route Frequency Provider Last Rate Last Admin  . acetaminophen (TYLENOL) tablet 650 mg  650 mg Oral Q6H PRN Gillermo Murdoch, NP   650 mg at 03/12/20 1201  . alum & mag hydroxide-simeth (MAALOX/MYLANTA) 200-200-20 MG/5ML suspension 30 mL  30 mL Oral Q4H PRN Gillermo Murdoch, NP      . carbamazepine (TEGRETOL) chewable tablet 100 mg  100 mg Oral BID Antonieta Pert, MD      . escitalopram (LEXAPRO) tablet 10 mg  10 mg Oral Once Antonieta Pert, MD      . Melene Muller ON 03/13/2020] escitalopram (LEXAPRO) tablet 20 mg  20 mg Oral Daily Antonieta Pert, MD      . Melene Muller ON 03/13/2020] gabapentin (NEURONTIN) capsule 400 mg  400 mg Oral TID Antonieta Pert, MD      . hydrOXYzine  (ATARAX/VISTARIL) tablet 10 mg  10 mg Oral TID PRN Dahlia Byes C, NP   10 mg at 03/12/20 3846  . LORazepam (ATIVAN) tablet 1 mg  1 mg Oral Q6H PRN Antonieta Pert, MD   1 mg at 03/12/20 1004  . magnesium hydroxide (MILK OF MAGNESIA) suspension 30 mL  30 mL Oral Daily PRN Gillermo Murdoch, NP      . nicotine (NICODERM CQ - dosed in mg/24 hours) patch 21 mg  21 mg Transdermal Daily  Antonieta Pert, MD   21 mg at 03/12/20 1500  . ondansetron (ZOFRAN) tablet 8 mg  8 mg Oral Q8H PRN Antonieta Pert, MD   8 mg at 03/11/20 0956  . traZODone (DESYREL) tablet 50 mg  50 mg Oral QHS PRN Armandina Stammer I, NP   50 mg at 03/11/20 2156    Lab Results: No results found for this or any previous visit (from the past 48 hour(s)).  Blood Alcohol level:  Lab Results  Component Value Date   ETH <10 03/10/2020   ETH <10 03/09/2020    Metabolic Disorder Labs: Lab Results  Component Value Date   HGBA1C 5.6 04/28/2011   MPG 114 04/28/2011   No results found for: PROLACTIN Lab Results  Component Value Date   CHOL 139 03/10/2020   TRIG 134 03/10/2020   HDL 40 (L) 03/10/2020   CHOLHDL 3.5 03/10/2020   VLDL 27 03/10/2020   LDLCALC 72 03/10/2020   LDLCALC 81 04/28/2011    Physical Findings: AIMS: Facial and Oral Movements Muscles of Facial Expression: None, normal Lips and Perioral Area: None, normal Jaw: None, normal Tongue: None, normal,Extremity Movements Upper (arms, wrists, hands, fingers): None, normal Lower (legs, knees, ankles, toes): None, normal, Trunk Movements Neck, shoulders, hips: None, normal, Overall Severity Severity of abnormal movements (highest score from questions above): None, normal Incapacitation due to abnormal movements: None, normal Patient's awareness of abnormal movements (rate only patient's report): No Awareness, Dental Status Current problems with teeth and/or dentures?: No Does patient usually wear dentures?: No  CIWA:    COWS:      Musculoskeletal: Strength & Muscle Tone: within normal limits Gait & Station: normal Patient leans: N/A  Psychiatric Specialty Exam: Physical Exam Vitals and nursing note reviewed.  Constitutional:      Appearance: Normal appearance.  HENT:     Head: Normocephalic and atraumatic.  Pulmonary:     Effort: Pulmonary effort is normal.  Neurological:     General: No focal deficit present.     Mental Status: He is alert and oriented to person, place, and time.     Review of Systems  Blood pressure (!) 122/95, pulse 83, temperature 98.5 F (36.9 C), temperature source Oral, resp. rate 18, height 5\' 5"  (1.651 m), weight 54.4 kg, SpO2 99 %.Body mass index is 19.97 kg/m.  General Appearance: Casual  Eye Contact:  Fair  Speech:  Pressured  Volume:  Increased  Mood:  Anxious and Dysphoric  Affect:  Congruent  Thought Process:  Coherent and Descriptions of Associations: Intact  Orientation:  Full (Time, Place, and Person)  Thought Content:  Rumination  Suicidal Thoughts:  No  Homicidal Thoughts:  No  Memory:  Immediate;   Fair Recent;   Fair Remote;   Fair  Judgement:  Intact  Insight:  Fair  Psychomotor Activity:  Increased  Concentration:  Concentration: Fair and Attention Span: Fair  Recall:  of Knowledge:  Fair  Language:  Good  Akathisia:  Negative  Handed:  Right  AIMS (if indicated):     Assets:  Desire for Improvement Resilience  ADL's:  Intact  Cognition:  WNL  Sleep:  Number of Hours: 6.75     Treatment Plan Summary: Daily contact with patient to assess and evaluate symptoms and progress in treatment, Medication management and Plan : Patient is seen and examined.  Patient is a 24 year old male with the above-stated past psychiatric history who is seen in follow-up.   Diagnosis:  1.  Bipolar depression versus major depressive disorder. 2.  Generalized anxiety disorder. 3.  Cannabis use disorder  Pertinent findings on examination today: 1.   Pressured speech 2.  Somewhat tangential. 3.  Significant anxiety  Plan: 1.  Increase Lexapro to 20 mg p.o. daily for anxiety and depression. 2.  Start Tegretol 100 mg p.o. twice daily for mood stability and anxiety. 3.  Increase gabapentin to 400 mg p.o. 3 times daily for chronic pain as well as anxiety. 4.  Continue hydroxyzine 10 mg p.o. 3 times daily as needed anxiety. 5.  Continue lorazepam 1 mg p.o. every 6 hours as needed a CIWA greater than 10. 6.  Continue Zofran 8 mg p.o. every 8 hours as needed nausea. 7.  Continue trazodone 50 mg p.o. nightly as needed insomnia. 8.  Disposition planning-progress.  Antonieta Pert, MD 03/12/2020, 5:06 PM
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
## 2020-03-12 NOTE — Progress Notes (Signed)
Patient rated his day as a 7 out of 10 since he spent less time in bed today. He also states that he feels less sick. His goal for tomorrow is to "control" his emotions better.

## 2020-03-12 NOTE — Progress Notes (Signed)
   03/12/20 2005  COVID-19 Daily Checkoff  Have you had a fever (temp > 37.80C/100F)  in the past 24 hours?  No  COVID-19 EXPOSURE  Have you traveled outside the state in the past 14 days? No  Have you been in contact with someone with a confirmed diagnosis of COVID-19 or PUI in the past 14 days without wearing appropriate PPE? No  Have you been living in the same home as a person with confirmed diagnosis of COVID-19 or a PUI (household contact)? No  Have you been diagnosed with COVID-19? No

## 2020-03-13 MED ORDER — CARBAMAZEPINE 200 MG PO TABS
100.0000 mg | ORAL_TABLET | Freq: Two times a day (BID) | ORAL | Status: DC
  Filled 2020-03-13 (×3): qty 0.5

## 2020-03-13 MED ORDER — TRAZODONE HCL 50 MG PO TABS
50.0000 mg | ORAL_TABLET | Freq: Every evening | ORAL | 0 refills | Status: DC | PRN
Start: 2020-03-13 — End: 2022-11-19

## 2020-03-13 MED ORDER — ESCITALOPRAM OXALATE 20 MG PO TABS: 20.0000 mg | ORAL_TABLET | Freq: Every day | ORAL | 0 refills | Status: DC

## 2020-03-13 MED ORDER — GABAPENTIN 400 MG PO CAPS: 400.0000 mg | ORAL_CAPSULE | Freq: Three times a day (TID) | ORAL | 0 refills | Status: DC

## 2020-03-13 MED ORDER — NICOTINE 21 MG/24HR TD PT24
21.0000 mg | MEDICATED_PATCH | Freq: Every day | TRANSDERMAL | 0 refills | Status: DC
Start: 2020-03-14 — End: 2022-11-19

## 2020-03-13 MED ORDER — CARBAMAZEPINE 200 MG PO TABS: 100.0000 mg | ORAL_TABLET | Freq: Two times a day (BID) | ORAL | 0 refills | Status: DC

## 2020-03-13 MED ORDER — HYDROXYZINE HCL 10 MG PO TABS: 10.0000 mg | ORAL_TABLET | Freq: Three times a day (TID) | ORAL | 0 refills | Status: DC | PRN

## 2020-03-13 NOTE — [facility] Suicide Risk Assessment (Signed)
[facility] Discharge Suicide Risk Assessment   Principal Problem: Bipolar 1 disorder, mixed (HCC) Discharge Diagnoses: Principal Problem:   Bipolar 1 disorder, mixed (HCC) Active Problems:   Marijuana dependence (HCC)   Tobacco use disorder, moderate, dependence   Alcohol abuse   Substance induced mood disorder (HCC)   Total Time spent with patient: 20 minutes  Musculoskeletal: Strength & Muscle Tone: within normal limits Gait & Station: normal Patient leans: N/A  Psychiatric Specialty Exam: Review of Systems  All other systems reviewed and are negative.   Blood pressure 111/80, pulse 89, temperature 97.8 F (36.6 C), temperature source Oral, resp. rate 18, height 5\' 5"  (1.651 m), weight 54.4 kg, SpO2 99 %.Body mass index is 19.97 kg/m.  General Appearance: Casual  Eye Contact::  Good  Speech:  Normal Rate409  Volume:  Normal  Mood:  Euthymic  Affect:  Congruent  Thought Process:  Coherent and Descriptions of Associations: Intact  Orientation:  Full (Time, Place, and Person)  Thought Content:  Logical  Suicidal Thoughts:  No  Homicidal Thoughts:  No  Memory:  Immediate;   Good Recent;   Good Remote;   Good  Judgement:  Intact  Insight:  Fair  Psychomotor Activity:  Normal  Concentration:  Good  Recall:  Good  Fund of Knowledge:Good  Language: Good  Akathisia:  Negative  Handed:  Right  AIMS (if indicated):     Assets:  Desire for Improvement Housing Resilience Social Support  Sleep:  Number of Hours: 6.75  Cognition: WNL  ADL's:  Intact   Mental Status Per Nursing Assessment::   On Admission:  Suicidal ideation indicated by patient  Demographic Factors:  Male, Caucasian and Low socioeconomic status  Loss Factors: NA  Historical Factors: Impulsivity  Risk Reduction Factors:   Living with another person, especially a relative  Continued Clinical Symptoms:  Bipolar Disorder:   Depressive phase  Cognitive Features That Contribute To Risk:  None     Suicide Risk:  Minimal: No identifiable suicidal ideation.  Patients presenting with no risk factors but with morbid ruminations; may be classified as minimal risk based on the severity of the depressive symptoms   Follow-up Information    [facility], [facility] Follow up.   Contact information: [address] [place] [phone]               Plan Of Care/Follow-up recommendations:  Activity:  ad lib Tests:  Follow up with mental health provider and have CBC, LFT's and Tegretol level checked Other:  do not drink alcohol or use illegal drugs while on these medications  [phone], MD 03/13/2020, 10:31 AM
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
## 2020-03-13 NOTE — Progress Notes (Signed)
Discharge Note:  Patient denies SI/HI AVH at this time. Discharge instructions, AVS, prescriptions, samples and transition record gone over with patient. Patient agrees to comply with medication management, follow-up visit, and outpatient therapy. Patient belongings returned to patient. Patient questions and concerns addressed and answered.  Patient ambulatory off unit.  Patient discharged to home. 

## 2020-03-18 ENCOUNTER — Other Ambulatory Visit: Payer: Self-pay

## 2020-03-18 ENCOUNTER — Emergency Department (HOSPITAL_COMMUNITY)
Admission: EM | Admit: 2020-03-18 | Discharge: 2020-03-18 | Disposition: A | Discharge status: Home / Self Care | Payer: Self-pay | Attending: Emergency Medicine | Admitting: Emergency Medicine

## 2020-03-18 DIAGNOSIS — J45909 Unspecified asthma, uncomplicated: Secondary | ICD-10-CM | POA: Insufficient documentation

## 2020-03-18 DIAGNOSIS — K0889 Other specified disorders of teeth and supporting structures: Secondary | ICD-10-CM

## 2020-03-18 DIAGNOSIS — K047 Periapical abscess without sinus: Secondary | ICD-10-CM | POA: Insufficient documentation

## 2020-03-18 DIAGNOSIS — Z79899 Other long term (current) drug therapy: Secondary | ICD-10-CM | POA: Insufficient documentation

## 2020-03-18 DIAGNOSIS — F17210 Nicotine dependence, cigarettes, uncomplicated: Secondary | ICD-10-CM | POA: Insufficient documentation

## 2020-03-18 DIAGNOSIS — F1590 Other stimulant use, unspecified, uncomplicated: Secondary | ICD-10-CM | POA: Insufficient documentation

## 2020-03-18 DIAGNOSIS — K029 Dental caries, unspecified: Secondary | ICD-10-CM | POA: Insufficient documentation

## 2020-03-18 NOTE — Discharge Instructions (Signed)
Take ibuprofen 400 mg plus acetaminophen 650 mg every 6 hours as needed for pain.  Take your prescription for penicillin that you already have.  Please follow through with seeing a dentist to get definitive treatment of your tooth.

## 2020-03-18 NOTE — ED Provider Notes (Signed)
[facility] EMERGENCY DEPARTMENT Provider Note   CSN: [id number] Arrival date & time: 03/18/20  2137   Time seen 11:25 PM  History Chief Complaint  Patient presents with  . Dental Abscess    Marc [name] is a 24 y.o. male.  HPI   Patient states he has had pain in one of the molars in his left lower jaw for months.  He also has been having left-sided headaches for months.  He feels like now he has a "swollen vein in his left temple.  He denies fever, he denies any difficulty breathing or swallowing.  He initially told me he did not have a dentist and then told me he was going to call and make an appointment with 1 and had a specific office and mine although he could not tell me the name. He also took someone else's Suboxone prior to arrival.  He agrees that is not a good idea.  PCP Patient, No Pcp Per   Past Medical History:  Diagnosis Date  . Anxiety   . Asthma   . Depressed   . Polysubstance abuse (HCC)   . Seizures (HCC)   . Wears glasses     Patient Active Problem List   Diagnosis Date Noted  . MDD (major depressive disorder), single episode, severe (HCC) 03/10/2020  . Bipolar 1 disorder, mixed (HCC) 03/10/2020  . Marijuana dependence (HCC) 03/10/2020  . Tobacco use disorder, moderate, dependence 03/10/2020  . Alcohol abuse 03/10/2020  . Substance induced mood disorder (HCC) 03/10/2020  . MDD (major depressive disorder), recurrent episode, severe (HCC) 04/05/2018  . MDD (major depressive disorder) 06/22/2017  . 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 Severe episode of recurrent major depressive disorder, without psychotic features (HCC)   . Bipolar disorder, unspecified (HCC) 03/23/2017  . Depression, major, recurrent, in partial remission (HCC) 06/03/2011  . ELBOW SPRAIN, LEFT 09/17/2010    Past Surgical History:  Procedure Laterality Date  . COLON SURGERY    . FRACTURE SURGERY         Family  History  Problem Relation Age of Onset  . Anxiety disorder Father   . Depression Father   . Anxiety disorder Paternal Grandfather   . Anxiety disorder Mother   . Depression Mother   . Depression Cousin   . Drug abuse Cousin     Social History   Tobacco Use  . Smoking status: Current Every Day Smoker    Packs/day: 1.00    Types: Cigarettes  . Smokeless tobacco: Never Used  Vaping Use  . Vaping Use: Never used  Substance Use Topics  . Alcohol use: Yes    Comment: one beer a week  . Drug use: Yes    Frequency: 7.0 times per week    Types: Marijuana    Comment: couple blunts per day    Home Medications Prior to Admission medications   Medication Sig Start Date End Date Taking? Authorizing Provider  carbamazepine (TEGRETOL) 200 MG tablet Take 0.5 tablets (100 mg total) by mouth 2 (two) times daily. For mood stabilization 03/13/20   Nwoko, Nicole Kindred I, NP  EPINEPHrine (EPIPEN 2-PAK) 0.3 mg/0.3 mL IJ SOAJ injection Inject 0.3 mLs (0.3 mg total) into the muscle as needed for anaphylaxis (For severe allergic reaction with difficulty breathing, difficulty swallowing, tongue or lip swelling). 12/30/19   Rancour, Jeannett Senior, MD  escitalopram (LEXAPRO) 20 MG tablet Take 1 tablet (20 mg total) by mouth daily. For mood stabilization 03/14/20  Armandina Stammer I, NP  gabapentin (NEURONTIN) 400 MG capsule Take 1 capsule (400 mg total) by mouth 3 (three) times daily. For agitation 03/13/20   Armandina Stammer I, NP  hydrOXYzine (ATARAX/VISTARIL) 10 MG tablet Take 1 tablet (10 mg total) by mouth 3 (three) times daily as needed for anxiety. 03/13/20   Armandina Stammer I, NP  nicotine (NICODERM CQ - DOSED IN MG/24 HOURS) 21 mg/24hr patch Place 1 patch (21 mg total) onto the skin daily. (May buy from over the counter): For smoking cessation 03/14/20   Armandina Stammer I, NP  traZODone (DESYREL) 50 MG tablet Take 1 tablet (50 mg total) by mouth at bedtime as needed for sleep. 03/13/20   Armandina Stammer I, NP    Allergies      Aspirin and Promethazine hcl  Review of Systems   Review of Systems  All other systems reviewed and are negative.   Physical Exam Updated Vital Signs BP 131/85   Pulse 66   Temp 99 F (37.2 C)   Resp 19   Ht 5\' 5"  (1.651 m)   Wt 54.4 kg   SpO2 98%   BMI 19.97 kg/m   Physical Exam Vitals and nursing note reviewed.  Constitutional:      General: He is in acute distress.     Appearance: Normal appearance. He is normal weight.  HENT:     Head: Normocephalic and atraumatic.     Right Ear: External ear normal.     Left Ear: External ear normal.     Nose: Nose normal.     Mouth/Throat:     Mouth: Mucous membranes are moist.     Dentition: Dental tenderness present.     Pharynx: Uvula midline. No posterior oropharyngeal erythema.      Comments: There is no facial swelling observed.  Patient speech is normal, he is able to handle his secretions. Eyes:     Extraocular Movements: Extraocular movements intact.     Conjunctiva/sclera: Conjunctivae normal.     Pupils: Pupils are equal, round, and reactive to light.  Neck:     Comments: Patient has tenderness to palpation in his left neck but there is no swelling or masses felt. Cardiovascular:     Rate and Rhythm: Normal rate.  Pulmonary:     Effort: Pulmonary effort is normal. No respiratory distress.  Musculoskeletal:        General: Normal range of motion.     Cervical back: Normal range of motion. Tenderness present.  Lymphadenopathy:     Cervical: No cervical adenopathy.  Skin:    General: Skin is warm and dry.  Neurological:     General: No focal deficit present.     Mental Status: He is alert and oriented to person, place, and time.     Cranial Nerves: No cranial nerve deficit.  Psychiatric:        Mood and Affect: Mood normal.        Behavior: Behavior normal.        Thought Content: Thought content normal.     ED Results / Procedures / Treatments   Labs (all labs ordered are listed, but only abnormal  results are displayed) Labs Reviewed - No data to display  EKG None  Radiology No results found.  Procedures Procedures (including critical care time)  Medications Ordered in ED Medications - No data to display  ED Course  I have reviewed the triage vital signs and the nursing notes.  Pertinent labs &  imaging results that were available during my care of the patient were reviewed by me and considered in my medical decision making (see chart for details).    MDM Rules/Calculators/A&P                          During the course of discussion at his discharge patient relates he has a full prescription of penicillin he had gotten filled for this tooth before however he had another infection and was told to stop the penicillin.  He also has he states a complete prescription for cephalexin.  He was advised to take the penicillin.  At this point also he relates he has a dentist office he is going to follow-up with.  Patient then asked for a work note which seems to be the whole point of his visit.   Final Clinical Impression(s) / ED Diagnoses Final diagnoses:  Dental decay  Toothache    Rx / DC Orders ED Discharge Orders    None    OTC ibuprofen and acetaminophen  Plan discharge  Devoria Albe, MD, Concha Pyo, MD 03/18/20 [phone]

## 2020-03-18 NOTE — ED Triage Notes (Addendum)
Pt here  For dental abscess on left side of jaw. States that it is now causing swelling and pressure on the left side of his face and neck.   Pt states he took a Suboxone around 41m

## 2021-03-28 ENCOUNTER — Other Ambulatory Visit: Payer: Self-pay

## 2021-03-28 ENCOUNTER — Emergency Department (HOSPITAL_COMMUNITY)
Admission: EM | Admit: 2021-03-28 | Discharge: 2021-03-28 | Disposition: A | Discharge status: Home / Self Care | Payer: Self-pay | Attending: Emergency Medicine | Admitting: Emergency Medicine

## 2021-03-28 ENCOUNTER — Encounter (HOSPITAL_COMMUNITY): Payer: Self-pay | Admitting: *Deleted

## 2021-03-28 DIAGNOSIS — K429 Umbilical hernia without obstruction or gangrene: Secondary | ICD-10-CM | POA: Insufficient documentation

## 2021-03-28 DIAGNOSIS — R300 Dysuria: Secondary | ICD-10-CM | POA: Insufficient documentation

## 2021-03-28 DIAGNOSIS — J45909 Unspecified asthma, uncomplicated: Secondary | ICD-10-CM | POA: Insufficient documentation

## 2021-03-28 DIAGNOSIS — F17210 Nicotine dependence, cigarettes, uncomplicated: Secondary | ICD-10-CM | POA: Insufficient documentation

## 2021-03-28 LAB — URINALYSIS, ROUTINE W REFLEX MICROSCOPIC
Bilirubin Urine: NEGATIVE
Glucose, UA: NEGATIVE mg/dL
Hgb urine dipstick: NEGATIVE
Ketones, ur: NEGATIVE mg/dL
Leukocytes,Ua: NEGATIVE
Nitrite: NEGATIVE
Protein, ur: NEGATIVE mg/dL
Specific Gravity, Urine: 1.02 (ref 1.005–1.030)
pH: 7.5 (ref 5.0–8.0)

## 2021-03-28 MED ORDER — CEPHALEXIN 500 MG PO CAPS: 500.0000 mg | ORAL_CAPSULE | Freq: Four times a day (QID) | ORAL | 0 refills | Status: DC

## 2021-03-28 NOTE — ED Triage Notes (Signed)
Flank pain right side with burning upon urination

## 2021-03-28 NOTE — ED Triage Notes (Signed)
Also thinks he has a hernia around bellybutton

## 2021-03-28 NOTE — ED Provider Notes (Signed)
[facility] EMERGENCY DEPARTMENT Provider Note   CSN: [id number] Arrival date & time: 03/28/21  1109     History Chief Complaint  Patient presents with   Dysuria    Marc [name] is a 25 y.o. male.  HPI  Patient presents with dysuria.  States he has been having symptoms like this off and on for a year, but it worsened in the last 4 days.  It was minimally improved with a dose of Azo, but has been persistent.  He is not having any fevers or chills.  No blood in the urine.  The pain sometimes radiates up his right flank, but that comes and goes.  Has not had any new sexual partners, no penile discharge or swelling in the penis.  Reports he occasionally has pressure in his abdomen, reports he has had history of hernias.  Hernias are soft and reducible, do not elicit pain.  The hernia is present whenever he is lifting anything heavy.  He has been somewhat nauseated this morning, but did not eat any food.  He has not had any vomiting, diarrhea, bloody stool.  Past Medical History:  Diagnosis Date   Anxiety    Asthma    Depressed    Polysubstance abuse (HCC)    Seizures (HCC)    Wears glasses     Patient Active Problem List   Diagnosis Date Noted   MDD (major depressive disorder), single episode, severe (HCC) 03/10/2020   Bipolar 1 disorder, mixed (HCC) 03/10/2020   Marijuana dependence (HCC) 03/10/2020   Tobacco use disorder, moderate, dependence 03/10/2020   Alcohol abuse 03/10/2020   Substance induced mood disorder (HCC) 03/10/2020   MDD (major depressive disorder), recurrent episode, severe (HCC) 04/05/2018   MDD (major depressive disorder) 06/22/2017   Major depressive disorder, recurrent severe without psychotic features (HCC) 06/21/2017   Severe episode of recurrent major depressive disorder, without psychotic features (HCC)    Bipolar disorder, unspecified (HCC) 03/23/2017   Depression, major, recurrent, in partial remission (HCC) 06/03/2011   ELBOW SPRAIN, LEFT  09/17/2010    Past Surgical History:  Procedure Laterality Date   COLON SURGERY     FRACTURE SURGERY         Family History  Problem Relation Age of Onset   Anxiety disorder Father    Depression Father    Anxiety disorder Paternal Grandfather    Anxiety disorder Mother    Depression Mother    Depression Cousin    Drug abuse Cousin     Social History   Tobacco Use   Smoking status: Every Day    Packs/day: 1.00    Types: Cigarettes   Smokeless tobacco: Never  Vaping Use   Vaping Use: Never used  Substance Use Topics   Alcohol use: Yes    Comment: one beer a week   Drug use: Yes    Frequency: 7.0 times per week    Types: Marijuana    Comment: couple blunts per day    Home Medications Prior to Admission medications   Medication Sig Start Date End Date Taking? Authorizing Provider  carbamazepine (TEGRETOL) 200 MG tablet Take 0.5 tablets (100 mg total) by mouth 2 (two) times daily. For mood stabilization 03/13/20   Nwoko, Nicole Kindred I, NP  EPINEPHrine (EPIPEN 2-PAK) 0.3 mg/0.3 mL IJ SOAJ injection Inject 0.3 mLs (0.3 mg total) into the muscle as needed for anaphylaxis (For severe allergic reaction with difficulty breathing, difficulty swallowing, tongue or lip swelling). 12/30/19   Rancour, Jeannett Senior,  MD  escitalopram (LEXAPRO) 20 MG tablet Take 1 tablet (20 mg total) by mouth daily. For mood stabilization 03/14/20   Armandina Stammer I, NP  gabapentin (NEURONTIN) 400 MG capsule Take 1 capsule (400 mg total) by mouth 3 (three) times daily. For agitation 03/13/20   Armandina Stammer I, NP  hydrOXYzine (ATARAX/VISTARIL) 10 MG tablet Take 1 tablet (10 mg total) by mouth 3 (three) times daily as needed for anxiety. 03/13/20   Armandina Stammer I, NP  nicotine (NICODERM CQ - DOSED IN MG/24 HOURS) 21 mg/24hr patch Place 1 patch (21 mg total) onto the skin daily. (May buy from over the counter): For smoking cessation 03/14/20   Armandina Stammer I, NP  traZODone (DESYREL) 50 MG tablet Take 1 tablet (50 mg total)  by mouth at bedtime as needed for sleep. 03/13/20   Armandina Stammer I, NP    Allergies    Aspirin and Promethazine hcl  Review of Systems   Review of Systems  Constitutional:  Negative for fever.  Gastrointestinal:  Negative for abdominal pain, diarrhea and vomiting.  Genitourinary:  Positive for dysuria. Negative for flank pain, hematuria, penile pain and testicular pain.   Physical Exam Updated Vital Signs BP 103/78 (BP Location: Right Arm)   Pulse (!) 52   Temp 98.3 F (36.8 C)   Resp 18   Ht 5\' 7"  (1.702 m)   Wt 59.1 kg   SpO2 100%   BMI 20.41 kg/m   Physical Exam Vitals and nursing note reviewed. Exam conducted with a chaperone present.  Constitutional:      General: He is not in acute distress.    Appearance: Normal appearance.     Comments: Well-appearing, no acute distress  HENT:     Head: Normocephalic and atraumatic.  Eyes:     General: No scleral icterus.    Extraocular Movements: Extraocular movements intact.     Pupils: Pupils are equal, round, and reactive to light.  Cardiovascular:     Rate and Rhythm: Normal rate and regular rhythm.  Pulmonary:     Effort: Pulmonary effort is normal.     Breath sounds: Normal breath sounds.  Abdominal:     Comments: Patient has a soft umbilical hernia, easily reducible.  Abdomen is soft nontender.  Bowel sounds are present, no rigidity or guarding.  Skin:    Coloration: Skin is not jaundiced.  Neurological:     Mental Status: He is alert. Mental status is at baseline.     Coordination: Coordination normal.    ED Results / Procedures / Treatments   Labs (all labs ordered are listed, but only abnormal results are displayed) Labs Reviewed  URINALYSIS, ROUTINE W REFLEX MICROSCOPIC    EKG None  Radiology No results found.  Procedures Procedures   Medications Ordered in ED Medications - No data to display  ED Course  I have reviewed the triage vital signs and the nursing notes.  Pertinent labs & imaging  results that were available during my care of the patient were reviewed by me and considered in my medical decision making (see chart for details).    MDM Rules/Calculators/A&P                           Patient vitals are stable, he is not having any tenderness on the abdominal exam.  He is complaining of dysuria, will check a urine.  UTI is obvious on the differential, he says the pain sometimes  moves up his back but it does not appear to be related to nephrolithiasis given that it comes and goes, there is no blood in the urine, no history of the same.    Patient does report he has hernias, but are soft and reducible.  They do not appear incarcerated or strangulated.  No other tenderness to the abdomen, very low suspicion for any type of emergent pathology here.  Advised patient to follow-up with GI doctor for additional work-up if he requires it.  Also given information for general surgery at previous visit and he still has it and does not require more.  We will give patient referral as well to urology given the symptoms.    Urine culture was ordered, but urine came back negative for UTI.  Given that he is having the symptoms that we will proceed to treat with antibiotics empirically while the culture is still pending.  Patient I discussed return precautions and engaged in shared decision  making regarding additinoal abdominal pain workup.  Patient does not wish to proceed with additional work-up at this time, if his symptoms worsen or change in the abdominal area he will return. At this time patient is stable and appropriate for discharge home.  Final Clinical Impression(s) / ED Diagnoses Final diagnoses:  None    Rx / DC Orders ED Discharge Orders     None        Theron Arista, PA-C 03/28/21 1726    Bethann Berkshire, MD 03/29/21 1315

## 2021-03-28 NOTE — Discharge Instructions (Signed)
No signs of a UTI in your urine, but because you have been having the symptoms we will start you on antibiotic prophylactically.  Please take Keflex 4 times daily for the next 5 days.  Please schedule a follow-up with urology as well is with gastroenterology.  If things change or worsen please come back to the ED for additional evaluation, as we discussed we only checked you for UTI at today's visit.

## 2021-03-30 LAB — URINE CULTURE: Culture: NO GROWTH

## 2021-06-30 ENCOUNTER — Emergency Department (HOSPITAL_COMMUNITY)
Admission: EM | Admit: 2021-06-30 | Discharge: 2021-06-30 | Disposition: A | Discharge status: Home / Self Care | Payer: Self-pay | Attending: Emergency Medicine | Admitting: Emergency Medicine

## 2021-06-30 ENCOUNTER — Encounter (HOSPITAL_COMMUNITY): Payer: Self-pay | Admitting: *Deleted

## 2021-06-30 DIAGNOSIS — F17210 Nicotine dependence, cigarettes, uncomplicated: Secondary | ICD-10-CM | POA: Insufficient documentation

## 2021-06-30 DIAGNOSIS — N341 Nonspecific urethritis: Secondary | ICD-10-CM | POA: Insufficient documentation

## 2021-06-30 DIAGNOSIS — J45909 Unspecified asthma, uncomplicated: Secondary | ICD-10-CM | POA: Insufficient documentation

## 2021-06-30 DIAGNOSIS — N342 Other urethritis: Secondary | ICD-10-CM

## 2021-06-30 LAB — URINALYSIS, ROUTINE W REFLEX MICROSCOPIC
Bacteria, UA: NONE SEEN
Bilirubin Urine: NEGATIVE
Glucose, UA: NEGATIVE mg/dL
Hgb urine dipstick: NEGATIVE
Ketones, ur: NEGATIVE mg/dL
Nitrite: NEGATIVE
Protein, ur: NEGATIVE mg/dL
Specific Gravity, Urine: 1.023 (ref 1.005–1.030)
pH: 6 (ref 5.0–8.0)

## 2021-06-30 MED ORDER — DOXYCYCLINE HYCLATE 100 MG PO CAPS: 100.0000 mg | ORAL_CAPSULE | Freq: Two times a day (BID) | ORAL | 0 refills | Status: DC

## 2021-06-30 NOTE — ED Triage Notes (Signed)
Pain with urination

## 2021-06-30 NOTE — Discharge Instructions (Addendum)
Return if any problems.

## 2021-07-01 LAB — GC/CHLAMYDIA PROBE AMP (~~LOC~~) NOT AT ARMC
Chlamydia: NEGATIVE
Comment: NEGATIVE
Comment: NORMAL
Neisseria Gonorrhea: POSITIVE — AB

## 2021-07-02 NOTE — ED Provider Notes (Signed)
[facility] EMERGENCY DEPARTMENT Provider Note   CSN: [id number] Arrival date & time: 06/30/21  1547     History Chief Complaint  Patient presents with   Dysuria    Marc [name] is a 25 y.o. male.  The history is provided by the patient. No language interpreter was used.  Dysuria Presenting symptoms: dysuria and penile discharge   Relieved by:  Nothing Worsened by:  Nothing Ineffective treatments:  None tried Risk factors: STI exposure       Past Medical History:  Diagnosis Date   Anxiety    Asthma    Depressed    Polysubstance abuse (HCC)    Seizures (HCC)    Wears glasses     Patient Active Problem List   Diagnosis Date Noted   MDD (major depressive disorder), single episode, severe (HCC) 03/10/2020   Bipolar 1 disorder, mixed (HCC) 03/10/2020   Marijuana dependence (HCC) 03/10/2020   Tobacco use disorder, moderate, dependence 03/10/2020   Alcohol abuse 03/10/2020   Substance induced mood disorder (HCC) 03/10/2020   MDD (major depressive disorder), recurrent episode, severe (HCC) 04/05/2018   MDD (major depressive disorder) 06/22/2017   Major depressive disorder, recurrent severe without psychotic features (HCC) 06/21/2017   Severe episode of recurrent major depressive disorder, without psychotic features (HCC)    Bipolar disorder, unspecified (HCC) 03/23/2017   Depression, major, recurrent, in partial remission (HCC) 06/03/2011   ELBOW SPRAIN, LEFT 09/17/2010    Past Surgical History:  Procedure Laterality Date   COLON SURGERY     FRACTURE SURGERY         Family History  Problem Relation Age of Onset   Anxiety disorder Father    Depression Father    Anxiety disorder Paternal Grandfather    Anxiety disorder Mother    Depression Mother    Depression Cousin    Drug abuse Cousin     Social History   Tobacco Use   Smoking status: Every Day    Packs/day: 1.00    Types: Cigarettes   Smokeless tobacco: Never  Vaping Use   Vaping Use:  Never used  Substance Use Topics   Alcohol use: Yes    Comment: one beer a week   Drug use: Yes    Frequency: 7.0 times per week    Types: Marijuana    Comment: couple blunts per day    Home Medications Prior to Admission medications   Medication Sig Start Date End Date Taking? Authorizing Provider  doxycycline (VIBRAMYCIN) 100 MG capsule Take 1 capsule (100 mg total) by mouth 2 (two) times daily. 06/30/21  Yes Elson Areas, PA-C  carbamazepine (TEGRETOL) 200 MG tablet Take 0.5 tablets (100 mg total) by mouth 2 (two) times daily. For mood stabilization 03/13/20   Armandina Stammer I, NP  cephALEXin (KEFLEX) 500 MG capsule Take 1 capsule (500 mg total) by mouth 4 (four) times daily. 03/28/21   Theron Arista, PA-C  EPINEPHrine (EPIPEN 2-PAK) 0.3 mg/0.3 mL IJ SOAJ injection Inject 0.3 mLs (0.3 mg total) into the muscle as needed for anaphylaxis (For severe allergic reaction with difficulty breathing, difficulty swallowing, tongue or lip swelling). 12/30/19   Rancour, Jeannett Senior, MD  escitalopram (LEXAPRO) 20 MG tablet Take 1 tablet (20 mg total) by mouth daily. For mood stabilization 03/14/20   Armandina Stammer I, NP  gabapentin (NEURONTIN) 400 MG capsule Take 1 capsule (400 mg total) by mouth 3 (three) times daily. For agitation 03/13/20   Armandina Stammer I, NP  hydrOXYzine (ATARAX/VISTARIL)  10 MG tablet Take 1 tablet (10 mg total) by mouth 3 (three) times daily as needed for anxiety. 03/13/20   Armandina Stammer I, NP  nicotine (NICODERM CQ - DOSED IN MG/24 HOURS) 21 mg/24hr patch Place 1 patch (21 mg total) onto the skin daily. (May buy from over the counter): For smoking cessation 03/14/20   Armandina Stammer I, NP  traZODone (DESYREL) 50 MG tablet Take 1 tablet (50 mg total) by mouth at bedtime as needed for sleep. 03/13/20   Armandina Stammer I, NP    Allergies    Aspirin and Promethazine hcl  Review of Systems   Review of Systems  Genitourinary:  Positive for dysuria and penile discharge.  All other systems reviewed and  are negative.  Physical Exam Updated Vital Signs BP 120/78 (BP Location: Right Arm)    Pulse 77    Temp 98.7 F (37.1 C)    Resp 16    SpO2 99%   Physical Exam Vitals and nursing note reviewed.  Constitutional:      Appearance: He is well-developed.  HENT:     Head: Normocephalic.  Pulmonary:     Effort: Pulmonary effort is normal.  Abdominal:     General: There is no distension.  Genitourinary:    Penis: Normal.   Musculoskeletal:        General: Normal range of motion.     Cervical back: Normal range of motion.  Neurological:     General: No focal deficit present.     Mental Status: He is alert and oriented to person, place, and time.  Psychiatric:        Mood and Affect: Mood normal.    ED Results / Procedures / Treatments   Labs (all labs ordered are listed, but only abnormal results are displayed) Labs Reviewed  URINALYSIS, ROUTINE W REFLEX MICROSCOPIC - Abnormal; Notable for the following components:      Result Value   Leukocytes,Ua SMALL (*)    All other components within normal limits  GC/CHLAMYDIA PROBE AMP ([place]) NOT AT [facility] - Abnormal; Notable for the following components:   Neisseria Gonorrhea Positive (*)    All other components within normal limits    EKG None  Radiology No results found.  Procedures Procedures   Medications Ordered in ED Medications - No data to display  ED Course  I have reviewed the triage vital signs and the nursing notes.  Pertinent labs & imaging results that were available during my care of the patient were reviewed by me and considered in my medical decision making (see chart for details).    MDM Rules/Calculators/A&P                           MDM:  Pt given rx for doxycycline An After Visit Summary was printed and given to the patient.  Final Clinical Impression(s) / ED Diagnoses Final diagnoses:  Urethritis    Rx / DC Orders ED Discharge Orders          Ordered    doxycycline (VIBRAMYCIN) 100 MG  capsule  2 times daily        06/30/21 1821             [name] 07/02/21 0813    Terrilee Files, MD 07/02/21 [id number]

## 2021-07-03 ENCOUNTER — Emergency Department (HOSPITAL_COMMUNITY)
Admission: EM | Admit: 2021-07-03 | Discharge: 2021-07-03 | Disposition: A | Discharge status: Home / Self Care | Payer: Self-pay | Attending: Emergency Medicine | Admitting: Emergency Medicine

## 2021-07-03 DIAGNOSIS — F17210 Nicotine dependence, cigarettes, uncomplicated: Secondary | ICD-10-CM | POA: Insufficient documentation

## 2021-07-03 DIAGNOSIS — J45909 Unspecified asthma, uncomplicated: Secondary | ICD-10-CM | POA: Insufficient documentation

## 2021-07-03 DIAGNOSIS — A549 Gonococcal infection, unspecified: Secondary | ICD-10-CM | POA: Insufficient documentation

## 2021-07-03 MED ORDER — LIDOCAINE HCL (PF) 1 % IJ SOLN
1.0000 mL | Freq: Once | INTRAMUSCULAR | Status: AC
  Administered 2021-07-03: 1 mL
  Filled 2021-07-03: qty 30

## 2021-07-03 MED ORDER — CEFTRIAXONE SODIUM 500 MG IJ SOLR
500.0000 mg | Freq: Once | INTRAMUSCULAR | Status: AC
  Administered 2021-07-03: 500 mg via INTRAMUSCULAR
  Filled 2021-07-03: qty 500

## 2021-07-03 NOTE — ED Provider Notes (Signed)
[facility] EMERGENCY DEPARTMENT Provider Note   CSN: [id number] Arrival date & time: 07/03/21  1600     History No chief complaint on file.   Marc [name] is a 25 y.o. male.  HPI  Patient without significant medical history presents with chief complaint of Positive gonorrhea test.  Patient states that he was seen here on the 12th for dysuria, as well as penile discharge.  He was tested for [facility] chlamydia and had a UA performed, UA was unremarkable, and was started on doxycycline.  He was notified today that his gonorrhea test became positive and is here for treatment.  He does not endorse fevers, chills, stomach pain, nausea, vomiting, flank tenderness, testicular pain, penile pain, worsening penile discharge.  He has no other complaints this time.  Denies any alleviating or aggravating factors.  States he is here simply for his treatments.  Past Medical History:  Diagnosis Date   Anxiety    Asthma    Depressed    Polysubstance abuse (HCC)    Seizures (HCC)    Wears glasses     Patient Active Problem List   Diagnosis Date Noted   MDD (major depressive disorder), single episode, severe (HCC) 03/10/2020   Bipolar 1 disorder, mixed (HCC) 03/10/2020   Marijuana dependence (HCC) 03/10/2020   Tobacco use disorder, moderate, dependence 03/10/2020   Alcohol abuse 03/10/2020   Substance induced mood disorder (HCC) 03/10/2020   MDD (major depressive disorder), recurrent episode, severe (HCC) 04/05/2018   MDD (major depressive disorder) 06/22/2017   Major depressive disorder, recurrent severe without psychotic features (HCC) 06/21/2017   Severe episode of recurrent major depressive disorder, without psychotic features (HCC)    Bipolar disorder, unspecified (HCC) 03/23/2017   Depression, major, recurrent, in partial remission (HCC) 06/03/2011   ELBOW SPRAIN, LEFT 09/17/2010    Past Surgical History:  Procedure Laterality Date   COLON SURGERY     FRACTURE SURGERY          Family History  Problem Relation Age of Onset   Anxiety disorder Father    Depression Father    Anxiety disorder Paternal Grandfather    Anxiety disorder Mother    Depression Mother    Depression Cousin    Drug abuse Cousin     Social History   Tobacco Use   Smoking status: Every Day    Packs/day: 1.00    Types: Cigarettes   Smokeless tobacco: Never  Vaping Use   Vaping Use: Never used  Substance Use Topics   Alcohol use: Yes    Comment: one beer a week   Drug use: Yes    Frequency: 7.0 times per week    Types: Marijuana    Comment: couple blunts per day    Home Medications Prior to Admission medications   Medication Sig Start Date End Date Taking? Authorizing Provider  carbamazepine (TEGRETOL) 200 MG tablet Take 0.5 tablets (100 mg total) by mouth 2 (two) times daily. For mood stabilization 03/13/20   Armandina Stammer I, NP  cephALEXin (KEFLEX) 500 MG capsule Take 1 capsule (500 mg total) by mouth 4 (four) times daily. 03/28/21   Theron Arista, PA-C  doxycycline (VIBRAMYCIN) 100 MG capsule Take 1 capsule (100 mg total) by mouth 2 (two) times daily. 06/30/21   Elson Areas, PA-C  EPINEPHrine (EPIPEN 2-PAK) 0.3 mg/0.3 mL IJ SOAJ injection Inject 0.3 mLs (0.3 mg total) into the muscle as needed for anaphylaxis (For severe allergic reaction with difficulty breathing, difficulty swallowing, tongue  or lip swelling). 12/30/19   Rancour, Jeannett Senior, MD  escitalopram (LEXAPRO) 20 MG tablet Take 1 tablet (20 mg total) by mouth daily. For mood stabilization 03/14/20   Armandina Stammer I, NP  gabapentin (NEURONTIN) 400 MG capsule Take 1 capsule (400 mg total) by mouth 3 (three) times daily. For agitation 03/13/20   Armandina Stammer I, NP  hydrOXYzine (ATARAX/VISTARIL) 10 MG tablet Take 1 tablet (10 mg total) by mouth 3 (three) times daily as needed for anxiety. 03/13/20   Armandina Stammer I, NP  nicotine (NICODERM CQ - DOSED IN MG/24 HOURS) 21 mg/24hr patch Place 1 patch (21 mg total) onto the skin  daily. (May buy from over the counter): For smoking cessation 03/14/20   Armandina Stammer I, NP  traZODone (DESYREL) 50 MG tablet Take 1 tablet (50 mg total) by mouth at bedtime as needed for sleep. 03/13/20   Armandina Stammer I, NP    Allergies    Aspirin and Promethazine hcl  Review of Systems   Review of Systems  Constitutional:  Negative for chills and fever.  HENT:  Negative for congestion.   Respiratory:  Negative for shortness of breath.   Cardiovascular:  Negative for chest pain.  Gastrointestinal:  Negative for abdominal pain.  Genitourinary:  Negative for enuresis, penile discharge, scrotal swelling and testicular pain.  Musculoskeletal:  Negative for back pain.  Skin:  Negative for rash.  Neurological:  Negative for dizziness.  Hematological:  Does not bruise/bleed easily.   Physical Exam Updated Vital Signs BP 120/78 (BP Location: Right Arm)    Pulse 79    Temp 98.2 F (36.8 C) (Oral)    Resp 16    Ht 5\' 7"  (1.702 m)    Wt 59.2 kg    SpO2 99%    BMI 20.44 kg/m   Physical Exam Vitals and nursing note reviewed.  Constitutional:      General: He is not in acute distress.    Appearance: He is not ill-appearing.  HENT:     Head: Normocephalic and atraumatic.     Nose: No congestion.  Eyes:     Conjunctiva/sclera: Conjunctivae normal.  Cardiovascular:     Rate and Rhythm: Normal rate and regular rhythm.     Pulses: Normal pulses.     Heart sounds: No murmur heard.   No friction rub. No gallop.  Pulmonary:     Effort: No respiratory distress.     Breath sounds: No wheezing, rhonchi or rales.  Abdominal:     Palpations: Abdomen is soft.     Tenderness: There is no abdominal tenderness. There is no right CVA tenderness or left CVA tenderness.  Musculoskeletal:     Right lower leg: No edema.     Left lower leg: No edema.  Skin:    General: Skin is warm and dry.  Neurological:     Mental Status: He is alert.  Psychiatric:        Mood and Affect: Mood normal.    ED  Results / Procedures / Treatments   Labs (all labs ordered are listed, but only abnormal results are displayed) Labs Reviewed - No data to display  EKG None  Radiology No results found.  Procedures Procedures   Medications Ordered in ED Medications  cefTRIAXone (ROCEPHIN) injection 500 mg (has no administration in time range)  lidocaine (PF) (XYLOCAINE) 1 % injection 1 mL (has no administration in time range)    ED Course  I have reviewed the triage vital signs  and the nursing notes.  Pertinent labs & imaging results that were available during my care of the patient were reviewed by me and considered in my medical decision making (see chart for details).    MDM Rules/Calculators/A&P                         Initial impression-presents with complaint of STDs, vital signs reassuring, he is in no acute distress, will provide patient with antibiotics.  Work-up-due to well-appearing patient, benign physical exam, further lab work and imaging are not warranted at this time.  Rule out- Low suspicion for pyelonephritis or UTI as patient denies urinary symptoms, lower back pain, nausea vomiting fevers or chills.  Low suspicion for epididymitis or prostatitis as patient denies saddle pain, or testicular pain.  Low suspicion for systemic infection as patient is nontoxic-appearing, vital signs reassuring, no obvious source infection noted on exam.     Plan-  Gonorrhea-provide patient with ceftriaxone,  explained that he could discontinue doxycycline as he tested negative for chlamydia.  I recommend safe sex practices, following up with health department for further evaluation.  Vital signs have remained stable, no indication for hospital admission.  Patient given at home care as well strict return precautions.  Patient verbalized that they understood agreed to said plan.     Final Clinical Impression(s) / ED Diagnoses Final diagnoses:  Gonorrhea    Rx / DC Orders ED Discharge  Orders     None        Carroll Sage, PA-C 07/03/21 Rhina Brackett, MD 07/04/21 2222

## 2021-07-03 NOTE — Discharge Instructions (Signed)
You have been treated for gonorrhea, you are currently on doxycycline which is needed for chlamydia which you tested negative for you may discontinue this medication.  I recommend abstinence for next 7 days and having any sexual partners be tested for gonorrhea and chlamydia.  I have also given information for safe sex practices please review  Follow-up with health department for further evaluation they provide free screening and care for STDs.  Come back to the emergency department if you develop chest pain, shortness of breath, severe abdominal pain, uncontrolled nausea, vomiting, diarrhea.

## 2021-07-03 NOTE — ED Triage Notes (Signed)
Pt has positive for gonorrhea and needs treatment.  Was seen here 12th for same issue.

## 2021-07-09 ENCOUNTER — Encounter (HOSPITAL_COMMUNITY): Payer: Self-pay

## 2021-07-09 ENCOUNTER — Emergency Department (HOSPITAL_COMMUNITY): Payer: Self-pay

## 2021-07-09 ENCOUNTER — Emergency Department (HOSPITAL_COMMUNITY)
Admission: EM | Admit: 2021-07-09 | Discharge: 2021-07-09 | Disposition: A | Discharge status: Home / Self Care | Payer: Self-pay | Attending: Emergency Medicine | Admitting: Emergency Medicine

## 2021-07-09 ENCOUNTER — Other Ambulatory Visit: Payer: Self-pay

## 2021-07-09 DIAGNOSIS — J45909 Unspecified asthma, uncomplicated: Secondary | ICD-10-CM | POA: Insufficient documentation

## 2021-07-09 DIAGNOSIS — U071 COVID-19: Secondary | ICD-10-CM | POA: Insufficient documentation

## 2021-07-09 DIAGNOSIS — F17210 Nicotine dependence, cigarettes, uncomplicated: Secondary | ICD-10-CM | POA: Insufficient documentation

## 2021-07-09 LAB — RESP PANEL BY RT-PCR (FLU A&B, COVID) ARPGX2
Influenza A by PCR: NEGATIVE
Influenza B by PCR: NEGATIVE
SARS Coronavirus 2 by RT PCR: POSITIVE — AB

## 2021-07-09 MED ORDER — ONDANSETRON 4 MG PO TBDP
4.0000 mg | ORAL_TABLET | Freq: Once | ORAL | Status: AC
  Administered 2021-07-09: 09:00:00 4 mg via ORAL
  Filled 2021-07-09: qty 1

## 2021-07-09 NOTE — ED Triage Notes (Signed)
Patient with complaints of clod like symptoms with nausea, vomiting, and diarrhea that started Sunday.

## 2021-07-09 NOTE — ED Provider Notes (Signed)
[facility] EMERGENCY DEPARTMENT Provider Note   CSN: [id number] Arrival date & time: 07/09/21  0749     History Chief Complaint  Patient presents with   URI    Marc [name] is a 25 y.o. male.  HPI Patient presents with concern of flulike illness.  Onset was 4 days ago, since that time symptoms have been persistent with nausea, vomiting, diarrhea, myalgia, congestion.  Minimal relief with OTC medication.  Patient continues to smoke cigarettes.  He has no physician has not sought care until this point.  No focal pain.  No syncope.  No fever that he is aware of nor complaints of subjective fever/chills.  Chart review notable for recent evaluation here, treatment for gonorrhea.    Past Medical History:  Diagnosis Date   Anxiety    Asthma    Depressed    Polysubstance abuse (HCC)    Seizures (HCC)    Wears glasses     Patient Active Problem List   Diagnosis Date Noted   MDD (major depressive disorder), single episode, severe (HCC) 03/10/2020   Bipolar 1 disorder, mixed (HCC) 03/10/2020   Marijuana dependence (HCC) 03/10/2020   Tobacco use disorder, moderate, dependence 03/10/2020   Alcohol abuse 03/10/2020   Substance induced mood disorder (HCC) 03/10/2020   MDD (major depressive disorder), recurrent episode, severe (HCC) 04/05/2018   MDD (major depressive disorder) 06/22/2017   Major depressive disorder, recurrent severe without psychotic features (HCC) 06/21/2017   Severe episode of recurrent major depressive disorder, without psychotic features (HCC)    Bipolar disorder, unspecified (HCC) 03/23/2017   Depression, major, recurrent, in partial remission (HCC) 06/03/2011   ELBOW SPRAIN, LEFT 09/17/2010    Past Surgical History:  Procedure Laterality Date   COLON SURGERY     FRACTURE SURGERY         Family History  Problem Relation Age of Onset   Anxiety disorder Father    Depression Father    Anxiety disorder Paternal Grandfather    Anxiety disorder  Mother    Depression Mother    Depression Cousin    Drug abuse Cousin     Social History   Tobacco Use   Smoking status: Every Day    Packs/day: 1.00    Types: Cigarettes   Smokeless tobacco: Never  Vaping Use   Vaping Use: Never used  Substance Use Topics   Alcohol use: Yes    Comment: one beer a week   Drug use: Yes    Frequency: 7.0 times per week    Types: Marijuana    Comment: couple blunts per day    Home Medications Prior to Admission medications   Medication Sig Start Date End Date Taking? Authorizing Provider  carbamazepine (TEGRETOL) 200 MG tablet Take 0.5 tablets (100 mg total) by mouth 2 (two) times daily. For mood stabilization Patient not taking: Reported on 07/09/2021 03/13/20   Armandina Stammer I, NP  cephALEXin (KEFLEX) 500 MG capsule Take 1 capsule (500 mg total) by mouth 4 (four) times daily. Patient not taking: Reported on 07/09/2021 03/28/21   Theron Arista, PA-C  doxycycline (VIBRAMYCIN) 100 MG capsule Take 1 capsule (100 mg total) by mouth 2 (two) times daily. Patient not taking: Reported on 07/09/2021 06/30/21   Elson Areas, PA-C  EPINEPHrine (EPIPEN 2-PAK) 0.3 mg/0.3 mL IJ SOAJ injection Inject 0.3 mLs (0.3 mg total) into the muscle as needed for anaphylaxis (For severe allergic reaction with difficulty breathing, difficulty swallowing, tongue or lip swelling). 12/30/19  Rancour, Jeannett Senior, MD  escitalopram (LEXAPRO) 20 MG tablet Take 1 tablet (20 mg total) by mouth daily. For mood stabilization Patient not taking: Reported on 07/09/2021 03/14/20   Armandina Stammer I, NP  gabapentin (NEURONTIN) 400 MG capsule Take 1 capsule (400 mg total) by mouth 3 (three) times daily. For agitation Patient not taking: Reported on 07/09/2021 03/13/20   Armandina Stammer I, NP  hydrOXYzine (ATARAX/VISTARIL) 10 MG tablet Take 1 tablet (10 mg total) by mouth 3 (three) times daily as needed for anxiety. Patient not taking: Reported on 07/09/2021 03/13/20   Armandina Stammer I, NP  nicotine  (NICODERM CQ - DOSED IN MG/24 HOURS) 21 mg/24hr patch Place 1 patch (21 mg total) onto the skin daily. (May buy from over the counter): For smoking cessation Patient not taking: Reported on 07/09/2021 03/14/20   Armandina Stammer I, NP  traZODone (DESYREL) 50 MG tablet Take 1 tablet (50 mg total) by mouth at bedtime as needed for sleep. Patient not taking: Reported on 07/09/2021 03/13/20   Armandina Stammer I, NP    Allergies    Aspirin and Promethazine hcl  Review of Systems   Review of Systems  Constitutional:        Per HPI, otherwise negative  HENT:         Per HPI, otherwise negative  Respiratory:         Per HPI, otherwise negative  Cardiovascular:        Per HPI, otherwise negative  Gastrointestinal:  Negative for vomiting.  Endocrine:       Negative aside from HPI  Genitourinary:        Neg aside from HPI   Musculoskeletal:        Per HPI, otherwise negative  Skin: Negative.   Neurological:  Negative for syncope.   Physical Exam Updated Vital Signs BP 120/82    Pulse 61    Temp 98.3 F (36.8 C) (Oral)    Resp 12    Ht 5\' 7"  (1.702 m)    Wt 59 kg    SpO2 98%    BMI 20.36 kg/m   Physical Exam Vitals and nursing note reviewed.  Constitutional:      General: He is not in acute distress.    Appearance: He is well-developed.  HENT:     Head: Normocephalic and atraumatic.  Eyes:     Conjunctiva/sclera: Conjunctivae normal.  Cardiovascular:     Rate and Rhythm: Normal rate and regular rhythm.  Pulmonary:     Effort: Pulmonary effort is normal. No respiratory distress.     Breath sounds: No stridor.  Abdominal:     General: There is no distension.  Skin:    General: Skin is warm and dry.  Neurological:     Mental Status: He is alert and oriented to person, place, and time.    ED Results / Procedures / Treatments   Labs (all labs ordered are listed, but only abnormal results are displayed) Labs Reviewed  RESP PANEL BY RT-PCR (FLU A&B, COVID) ARPGX2 - Abnormal; Notable  for the following components:      Result Value   SARS Coronavirus 2 by RT PCR POSITIVE (*)    All other components within normal limits    EKG None  Radiology DG Chest Port 1 View  Result Date: 07/09/2021 CLINICAL DATA:  Flu-like symptoms EXAM: PORTABLE CHEST 1 VIEW COMPARISON:  Chest x-ray 08/06/2013 FINDINGS: Heart size and mediastinal contours are within normal limits. No suspicious pulmonary opacities  identified. No pleural effusion or pneumothorax visualized. No acute osseous abnormality appreciated. IMPRESSION: No acute intrathoracic process identified. Electronically Signed   By: Jannifer Hick M.D.   On: 07/09/2021 08:57    Procedures Procedures   Medications Ordered in ED Medications  ondansetron (ZOFRAN-ODT) disintegrating tablet 4 mg (4 mg Oral Given 07/09/21 0848)    ED Course  I have reviewed the triage vital signs and the nursing notes.  Pertinent labs & imaging results that were available during my care of the patient were reviewed by me and considered in my medical decision making (see chart for details).   11:54 AM On repeat exam patient is awake, alert, speaking clearly.  We discussed his findings, x-ray reassuring, COVID test positive.  Patient is a smoker, he was encouraged to stop.  He does not meet emergency use authorization criteria for any of the novel therapeutic agents, but is otherwise well aside from his smoking history. No evidence of bacteremia, sepsis no evidence for concurrent pneumonia, patient discharged in stable condition.  Final Clinical Impression(s) / ED Diagnoses Final diagnoses:  COVID     Gerhard Munch, MD 07/09/21 1155

## 2021-10-13 ENCOUNTER — Other Ambulatory Visit: Payer: Self-pay

## 2021-10-13 ENCOUNTER — Encounter (HOSPITAL_COMMUNITY): Payer: Self-pay

## 2021-10-13 ENCOUNTER — Emergency Department (HOSPITAL_COMMUNITY)
Admission: EM | Admit: 2021-10-13 | Discharge: 2021-10-13 | Disposition: A | Discharge status: Home / Self Care | Payer: Self-pay | Attending: Emergency Medicine | Admitting: Emergency Medicine

## 2021-10-13 DIAGNOSIS — R1013 Epigastric pain: Secondary | ICD-10-CM | POA: Insufficient documentation

## 2021-10-13 DIAGNOSIS — Z79899 Other long term (current) drug therapy: Secondary | ICD-10-CM | POA: Insufficient documentation

## 2021-10-13 DIAGNOSIS — F1210 Cannabis abuse, uncomplicated: Secondary | ICD-10-CM | POA: Insufficient documentation

## 2021-10-13 DIAGNOSIS — R112 Nausea with vomiting, unspecified: Secondary | ICD-10-CM | POA: Insufficient documentation

## 2021-10-13 DIAGNOSIS — L905 Scar conditions and fibrosis of skin: Secondary | ICD-10-CM | POA: Insufficient documentation

## 2021-10-13 LAB — RAPID URINE DRUG SCREEN, HOSP PERFORMED
Amphetamines: NOT DETECTED
Barbiturates: NOT DETECTED
Benzodiazepines: NOT DETECTED
Cocaine: NOT DETECTED
Opiates: NOT DETECTED
Tetrahydrocannabinol: POSITIVE — AB

## 2021-10-13 LAB — COMPREHENSIVE METABOLIC PANEL
ALT: 34 U/L (ref 0–44)
AST: 27 U/L (ref 15–41)
Albumin: 4.6 g/dL (ref 3.5–5.0)
Alkaline Phosphatase: 47 U/L (ref 38–126)
Anion gap: 7 (ref 5–15)
BUN: 12 mg/dL (ref 6–20)
CO2: 25 mmol/L (ref 22–32)
Calcium: 9.1 mg/dL (ref 8.9–10.3)
Chloride: 107 mmol/L (ref 98–111)
Creatinine, Ser: 0.93 mg/dL (ref 0.61–1.24)
GFR, Estimated: >60 mL/min (ref >60)
Glucose, Bld: 115 mg/dL — ABNORMAL HIGH (ref 70–99)
Potassium: 3.8 mmol/L (ref 3.5–5.1)
Sodium: 139 mmol/L (ref 135–145)
Total Bilirubin: 0.9 mg/dL (ref 0.3–1.2)
Total Protein: 8 g/dL (ref 6.5–8.1)

## 2021-10-13 LAB — CBC WITH DIFFERENTIAL/PLATELET
Abs Immature Granulocytes: 0.04 10*3/uL (ref 0.00–0.07)
Basophils Absolute: 0.1 10*3/uL (ref 0.0–0.1)
Basophils Relative: 0 %
Eosinophils Absolute: 0.1 10*3/uL (ref 0.0–0.5)
Eosinophils Relative: 1 %
HCT: 45.4 % (ref 39.0–52.0)
Hemoglobin: 16.1 g/dL (ref 13.0–17.0)
Immature Granulocytes: 0 %
Lymphocytes Relative: 17 %
Lymphs Abs: 2 10*3/uL (ref 0.7–4.0)
MCH: 32.5 pg (ref 26.0–34.0)
MCHC: 35.5 g/dL (ref 30.0–36.0)
MCV: 91.7 fL (ref 80.0–100.0)
Monocytes Absolute: 0.7 10*3/uL (ref 0.1–1.0)
Monocytes Relative: 6 %
Neutro Abs: 8.7 10*3/uL — ABNORMAL HIGH (ref 1.7–7.7)
Neutrophils Relative %: 76 %
Platelets: 256 10*3/uL (ref 150–400)
RBC: 4.95 MIL/uL (ref 4.22–5.81)
RDW: 12.2 % (ref 11.5–15.5)
WBC: 11.6 10*3/uL — ABNORMAL HIGH (ref 4.0–10.5)
nRBC: 0 % (ref 0.0–0.2)

## 2021-10-13 LAB — LIPASE, BLOOD: Lipase: 34 U/L (ref 11–51)

## 2021-10-13 MED ORDER — SODIUM CHLORIDE 0.9 % IV BOLUS
1000.0000 mL | Freq: Once | INTRAVENOUS | Status: AC
  Administered 2021-10-13: 1000 mL via INTRAVENOUS

## 2021-10-13 MED ORDER — ONDANSETRON HCL 4 MG/2ML IJ SOLN
4.0000 mg | Freq: Once | INTRAMUSCULAR | Status: AC
  Administered 2021-10-13: 4 mg via INTRAVENOUS
  Filled 2021-10-13: qty 2

## 2021-10-13 MED ORDER — ONDANSETRON HCL 4 MG PO TABS: 4.0000 mg | ORAL_TABLET | Freq: Four times a day (QID) | ORAL | 0 refills | Status: DC

## 2021-10-13 NOTE — ED Notes (Signed)
Pt given peanut butter crackers at this time. ?

## 2021-10-13 NOTE — ED Notes (Signed)
Patient given Sprite at this time.  ?

## 2021-10-13 NOTE — Discharge Instructions (Signed)
Lab work and exam are reassuring.  I recommend a bland diet as well as discontinuing cannabis use as this can worsen your nausea and vomiting.  Given you Zofran please use needed for nausea. ? ?Please follow-up your PCP as needed ? ?Come back to the emergency department if you develop chest pain, shortness of breath, severe abdominal pain, uncontrolled nausea, vomiting, diarrhea. ? ?

## 2021-10-13 NOTE — ED Provider Notes (Signed)
See ?[facility]PART[facility] ?Provider Note ? ? ?CSN: [id number] ?Arrival date & time: 10/13/21  [id number] ? ?  ? ?History ? ?Chief Complaint  ?Patient presents with  ? Emesis  ? ? ?Marc [name] is a 26 y.o. male. ? ?HPI ? ?Patient with medical history including polysubstance dependency, depression, presents with complaints of nausea and vomiting.  Patient states this came on suddenly this morning, states he woke around 4 AM feeling nauseous and had persistent vomiting.  States he been unable to tolerate p.o.  He states he has vomited multiple times states he noticed some blood stingy emesis but denies hematemesis or coffee ground emesis, he states he had a normal bowel movement today, denies any melena or hematochezia.  He states he is feeling subjective hot and cold spells after vomiting but had no active fevers, denies any nasal congestion sore throat cough general body aches denies any recent sick contacts.  States he denies eating any abnormal foods, states he had a singular shot of alcohol last night as well as smoked some marijuana.  Patient states when he was younger he had abdominal surgery does not remember why he had it or what the name of the surgery was. ? ?I reviewed patient's chart been seen multiple times for number of different issues.  Cannot locate the name of the surgery patient had for in his abdominal region. ? ?Home Medications ?Prior to Admission medications   ?Medication Sig Start Date End Date Taking? Authorizing Provider  ?ondansetron (ZOFRAN) 4 MG tablet Take 1 tablet (4 mg total) by mouth every 6 (six) hours. 10/13/21  Yes Carroll SageFaulkner, Calypso Hagarty J, PA-C  ?carbamazepine (TEGRETOL) 200 MG tablet Take 0.5 tablets (100 mg total) by mouth 2 (two) times daily. For mood stabilization ?Patient not taking: Reported on 07/09/2021 03/13/20   Armandina StammerNwoko, Agnes I, NP  ?cephALEXin (KEFLEX) 500 MG capsule Take 1 capsule (500 mg total) by mouth 4 (four) times daily. ?Patient not taking: Reported on  07/09/2021 03/28/21   Theron AristaSage, Haley, PA-C  ?doxycycline (VIBRAMYCIN) 100 MG capsule Take 1 capsule (100 mg total) by mouth 2 (two) times daily. ?Patient not taking: Reported on 07/09/2021 06/30/21   Elson AreasSofia, Leslie K, PA-C  ?EPINEPHrine (EPIPEN 2-PAK) 0.3 mg/0.3 mL IJ SOAJ injection Inject 0.3 mLs (0.3 mg total) into the muscle as needed for anaphylaxis (For severe allergic reaction with difficulty breathing, difficulty swallowing, tongue or lip swelling). ?Patient not taking: Reported on 10/13/2021 12/30/19   Glynn Octaveancour, Stephen, MD  ?escitalopram (LEXAPRO) 20 MG tablet Take 1 tablet (20 mg total) by mouth daily. For mood stabilization ?Patient not taking: Reported on 07/09/2021 03/14/20   Armandina StammerNwoko, Agnes I, NP  ?gabapentin (NEURONTIN) 400 MG capsule Take 1 capsule (400 mg total) by mouth 3 (three) times daily. For agitation ?Patient not taking: Reported on 07/09/2021 03/13/20   Armandina StammerNwoko, Agnes I, NP  ?hydrOXYzine (ATARAX/VISTARIL) 10 MG tablet Take 1 tablet (10 mg total) by mouth 3 (three) times daily as needed for anxiety. ?Patient not taking: Reported on 07/09/2021 03/13/20   Armandina StammerNwoko, Agnes I, NP  ?nicotine (NICODERM CQ - DOSED IN MG/24 HOURS) 21 mg/24hr patch Place 1 patch (21 mg total) onto the skin daily. (May buy from over the counter): For smoking cessation ?Patient not taking: Reported on 07/09/2021 03/14/20   Armandina StammerNwoko, Agnes I, NP  ?traZODone (DESYREL) 50 MG tablet Take 1 tablet (50 mg total) by mouth at bedtime as needed for sleep. ?Patient not taking: Reported on 07/09/2021 03/13/20   Armandina StammerNwoko, Agnes  I, NP  ?   ? ?Allergies    ?Aspirin and Promethazine hcl   ? ?Review of Systems   ?Review of Systems  ?Constitutional:  Negative for chills and fever.  ?Respiratory:  Negative for shortness of breath.   ?Cardiovascular:  Negative for chest pain.  ?Gastrointestinal:  Positive for nausea and vomiting. Negative for abdominal pain.  ?Neurological:  Negative for headaches.  ? ?Physical Exam ?Updated Vital Signs ?BP 112/75   Pulse 64   Temp  98.6 ?F (37 ?C) (Oral)   Resp 17   Ht 5\' 7"  (1.702 m)   Wt 63.5 kg   SpO2 99%   BMI 21.93 kg/m?  ?Physical Exam ?Vitals and nursing note reviewed.  ?Constitutional:   ?   Comments: Patient is overall well-appearing.  No acute distress  ?HENT:  ?   Head: Normocephalic and atraumatic.  ?   Nose: No congestion.  ?Eyes:  ?   Conjunctiva/sclera: Conjunctivae normal.  ?Cardiovascular:  ?   Rate and Rhythm: Normal rate and regular rhythm.  ?   Pulses: Normal pulses.  ?   Heart sounds: No murmur heard. ?  No friction rub. No gallop.  ?Pulmonary:  ?   Effort: No respiratory distress.  ?   Breath sounds: No wheezing, rhonchi or rales.  ?Abdominal:  ?   Palpations: Abdomen is soft.  ?   Tenderness: There is abdominal tenderness. There is no right CVA tenderness or left CVA tenderness.  ?   Comments: Abdomen has a noted surgical scar in the lower quadrant, no overlying skin changes, abdomen is nondistended normal active bowel sounds, dull to percussion, has slight tenderness in the epigastric region but there is no guarding rebound tenderness, peritoneal sign negative Murphy sign McBurney point.  ?Musculoskeletal:  ?   Right lower leg: No edema.  ?   Left lower leg: No edema.  ?Skin: ?   General: Skin is warm and dry.  ?Neurological:  ?   Mental Status: He is alert.  ?Psychiatric:     ?   Mood and Affect: Mood normal.  ? ? ?ED Results / Procedures / Treatments   ?Labs ?(all labs ordered are listed, but only abnormal results are displayed) ?Labs Reviewed  ?COMPREHENSIVE METABOLIC PANEL - Abnormal; Notable for the following components:  ?    Result Value  ? Glucose, Bld 115 (*)   ? All other components within normal limits  ?CBC WITH DIFFERENTIAL/PLATELET - Abnormal; Notable for the following components:  ? WBC 11.6 (*)   ? Neutro Abs 8.7 (*)   ? All other components within normal limits  ?RAPID URINE DRUG SCREEN, HOSP PERFORMED - Abnormal; Notable for the following components:  ? Tetrahydrocannabinol POSITIVE (*)   ? All  other components within normal limits  ?LIPASE, BLOOD  ? ? ?EKG ?None ? ?Radiology ?No results found. ? ?Procedures ?Procedures  ? ? ?Medications Ordered in ED ?Medications  ?sodium chloride 0.9 % bolus 1,000 mL (0 mLs Intravenous Stopped 10/13/21 1025)  ?ondansetron [facility]) injection 4 mg (4 mg Intravenous Given 10/13/21 0927)  ? ? ?ED Course/ Medical Decision Making/ A&P ?  ?                        ?Medical Decision Making ?Amount and/or Complexity of Data Reviewed ?Labs: ordered. ? ?Risk ?Prescription drug management. ? ? ?This patient presents to the ED for concern of nausea vomiting, this involves an extensive number of treatment options, and  is a complaint that carries with it a high risk of complications and morbidity.  The differential diagnosis includes duction, diverticulitis, gastritis, pancreatitis ? ? ? ?Additional history obtained: ? ?Additional history obtained from electron medical record ?External records from outside source obtained and reviewed including please see HPI for further detail ? ? ?Co morbidities that complicate the patient evaluation ? ?Polysubstance dependency ? ?Social Determinants of Health: ? ?N/A ? ? ? ?Lab Tests: ? ?I Ordered, and personally interpreted labs.  The pertinent results include: CBC shows slight leukocytosis 11.6, CMP shows slight hyperglycemia 115, lipase is 34, rapid urine drug screen is positive for touch cannabinoids. ? ?Imaging Studies ordered: ? ?I ordered imaging studies including N/A ?I independently visualized and interpreted imaging which showed N/A ?I agree with the radiologist interpretation ? ? ?Cardiac Monitoring: ? ?The patient was maintained on a cardiac monitor.  I personally viewed and interpreted the cardiac monitored which showed an underlying rhythm of: N/A ? ? ?Medicines ordered and prescription drug management: ? ?I ordered medication including fluids, antiemetics for nausea ?I have reviewed the patients home medicines and have made adjustments  as needed ? ?Critical Interventions: ? ?N/A ? ? ?Reevaluation: ? ?Presents with nausea vomiting abdominal exam was benign nonsurgical, will obtain basic lab work-up provide with fluids antiemetics and reassess. ? ?Pati

## 2021-10-13 NOTE — ED Triage Notes (Signed)
Patient reports vomiting since 0430. States that he is dry heaving with nausea now and having cold chills. Denies diarrhea or abdominal pain.  ?

## 2021-11-27 ENCOUNTER — Encounter (HOSPITAL_COMMUNITY): Payer: Self-pay | Admitting: *Deleted

## 2021-11-27 ENCOUNTER — Emergency Department (HOSPITAL_COMMUNITY)
Admission: EM | Admit: 2021-11-27 | Discharge: 2021-11-27 | Disposition: A | Discharge status: Home / Self Care | Payer: Self-pay | Attending: Emergency Medicine | Admitting: Emergency Medicine

## 2021-11-27 ENCOUNTER — Emergency Department (HOSPITAL_COMMUNITY): Payer: Self-pay

## 2021-11-27 ENCOUNTER — Other Ambulatory Visit: Payer: Self-pay

## 2021-11-27 DIAGNOSIS — R300 Dysuria: Secondary | ICD-10-CM | POA: Insufficient documentation

## 2021-11-27 DIAGNOSIS — K4090 Unilateral inguinal hernia, without obstruction or gangrene, not specified as recurrent: Secondary | ICD-10-CM | POA: Insufficient documentation

## 2021-11-27 LAB — URINALYSIS, ROUTINE W REFLEX MICROSCOPIC
Bilirubin Urine: NEGATIVE
Glucose, UA: 50 mg/dL — AB
Hgb urine dipstick: NEGATIVE
Ketones, ur: NEGATIVE mg/dL
Leukocytes,Ua: NEGATIVE
Nitrite: NEGATIVE
Protein, ur: NEGATIVE mg/dL
Specific Gravity, Urine: 1.006 (ref 1.005–1.030)
pH: 7 (ref 5.0–8.0)

## 2021-11-27 NOTE — Discharge Instructions (Signed)
Follow-up with general surgery.  Take Tylenol Motrin for pain. ?

## 2021-11-27 NOTE — ED Provider Notes (Signed)
?[place] EMERGENCY DEPARTMENT ?Provider Note ? ? ?CSN: [id number] ?Arrival date & time: 11/27/21  1518 ? ?  ? ?History ? ?Chief Complaint  ?Patient presents with  ? Groin Pain  ? ? ?Marc [name] is a 26 y.o. male. ? ? ?Groin Pain ? ? ?Patient with medical history notable for substance use disorder, left inguinal hernia presents today due to left groin pain.  It happened when he turned at work today.  He states he has been having pain for about an hour and has not been able to reduce it, it is worse whenever he strains or squats.  Also endorses dysuria but denies any penile discharge. ? ?Home Medications ?Prior to Admission medications   ?Medication Sig Start Date End Date Taking? Authorizing Provider  ?ibuprofen (ADVIL) 200 MG tablet Take 400 mg by mouth every 6 (six) hours as needed.   Yes [provider]  ?carbamazepine (TEGRETOL) 200 MG tablet Take 0.5 tablets (100 mg total) by mouth 2 (two) times daily. For mood stabilization ?Patient not taking: Reported on 07/09/2021 03/13/20   Armandina Stammer I, NP  ?cephALEXin (KEFLEX) 500 MG capsule Take 1 capsule (500 mg total) by mouth 4 (four) times daily. ?Patient not taking: Reported on 07/09/2021 03/28/21   Theron Arista, PA-C  ?doxycycline (VIBRAMYCIN) 100 MG capsule Take 1 capsule (100 mg total) by mouth 2 (two) times daily. ?Patient not taking: Reported on 07/09/2021 06/30/21   Elson Areas, PA-C  ?EPINEPHrine (EPIPEN 2-PAK) 0.3 mg/0.3 mL IJ SOAJ injection Inject 0.3 mLs (0.3 mg total) into the muscle as needed for anaphylaxis (For severe allergic reaction with difficulty breathing, difficulty swallowing, tongue or lip swelling). ?Patient not taking: Reported on 10/13/2021 12/30/19   Glynn Octave, MD  ?escitalopram (LEXAPRO) 20 MG tablet Take 1 tablet (20 mg total) by mouth daily. For mood stabilization ?Patient not taking: Reported on 07/09/2021 03/14/20   Armandina Stammer I, NP  ?gabapentin (NEURONTIN) 400 MG capsule Take 1 capsule (400 mg total) by  mouth 3 (three) times daily. For agitation ?Patient not taking: Reported on 07/09/2021 03/13/20   Armandina Stammer I, NP  ?hydrOXYzine (ATARAX/VISTARIL) 10 MG tablet Take 1 tablet (10 mg total) by mouth 3 (three) times daily as needed for anxiety. ?Patient not taking: Reported on 07/09/2021 03/13/20   Armandina Stammer I, NP  ?nicotine (NICODERM CQ - DOSED IN MG/24 HOURS) 21 mg/24hr patch Place 1 patch (21 mg total) onto the skin daily. (May buy from over the counter): For smoking cessation ?Patient not taking: Reported on 07/09/2021 03/14/20   Armandina Stammer I, NP  ?ondansetron (ZOFRAN) 4 MG tablet Take 1 tablet (4 mg total) by mouth every 6 (six) hours. ?Patient not taking: Reported on 11/27/2021 10/13/21   Carroll Chera Slivka, PA-C  ?traZODone (DESYREL) 50 MG tablet Take 1 tablet (50 mg total) by mouth at bedtime as needed for sleep. ?Patient not taking: Reported on 07/09/2021 03/13/20   Armandina Stammer I, NP  ?   ? ?Allergies    ?Aspirin and Promethazine hcl   ? ?Review of Systems   ?Review of Systems ? ?Physical Exam ?Updated Vital Signs ?BP 110/81 (BP Location: Right Arm)   Pulse 76   Temp 98.1 ?F (36.7 ?C) (Oral)   Resp 19   Ht 5\' 7"  (1.702 m)   Wt 63.5 kg   SpO2 97%   BMI 21.93 kg/m?  ?Physical Exam ?Vitals and nursing note reviewed. Exam conducted with a chaperone present.  ?Constitutional:   ?  General: He is not in acute distress. ?   Appearance: Normal appearance.  ?HENT:  ?   Head: Normocephalic and atraumatic.  ?Eyes:  ?   General: No scleral icterus. ?   Extraocular Movements: Extraocular movements intact.  ?   Pupils: Pupils are equal, round, and reactive to light.  ?Genitourinary: ?   Comments: GU exam performed with male chaperone in room.  Patient has a small left inguinal hernia which is soft and easily reducible.  Not incarcerated ?Skin: ?   Coloration: Skin is not jaundiced.  ?Neurological:  ?   Mental Status: He is alert. Mental status is at baseline.  ?   Coordination: Coordination normal.  ? ? ?ED  Results / Procedures / Treatments   ?Labs ?(all labs ordered are listed, but only abnormal results are displayed) ?Labs Reviewed  ?URINALYSIS, ROUTINE W REFLEX MICROSCOPIC - Abnormal; Notable for the following components:  ?    Result Value  ? Color, Urine STRAW (*)   ? Glucose, UA 50 (*)   ? All other components within normal limits  ?GC/CHLAMYDIA PROBE AMP ([place]) NOT AT [facility]  ? ? ?EKG ?None ? ?Radiology ?US SCROTUM W/DOPPLER ? ?Result Date: 11/27/2021 ?CLINICAL DATA:  Left pleural pain EXAM: SCROTAL ULTRASOUND DOPPLER ULTRASOUND OF THE TESTICLES TECHNIQUE: Complete ultrasound examination of the testicles, epididymis, and other scrotal structures was performed. Color and spectral Doppler ultrasound were also utilized to evaluate blood flow to the testicles. COMPARISON:  None Available. FINDINGS: Right testicle Measurements: 4.8 x 2.2 x 3.2 cm. No mass or microlithiasis visualized. Left testicle Measurements: 4.5 x 2.9 x 2.8 cm. No mass or microlithiasis visualized. Right epididymis: Normal in size. Multiple epididymal cyst, largest measures 0.6 cm. Left epididymis:  Normal in size and appearance. Hydrocele:  Trace right and small left hydroceles. Varicocele:  None visualized. Pulsed Doppler interrogation of both testes demonstrates normal low resistance arterial and venous waveforms bilaterally. IMPRESSION: No sonographic evidence of testicular torsion. Electronically Signed   By: Allegra Lai M.D.   On: 11/27/2021 17:05   ? ?Procedures ?Procedures  ? ? ?Medications Ordered in ED ?Medications - No data to display ? ?ED Course/ Medical Decision Making/ A&P ?  ?                        ?Medical Decision Making ?Amount and/or Complexity of Data Reviewed ?Labs: ordered. ?Radiology: ordered. ? ? ?Patient presents due to groin pain.  Differential included torsion, UTI, incarcerated hernia, strangulated hernia. ? ?On exam the hernia is soft and easily reducible, has not incarcerated or strangulated.  UA obtained,  no evidence of underlying UTI.  GC chlamydia obtained, will not treat empirically for STDs given he is not having any penile discharge or any recent unprotected sex. ? ?I provided the patient with referral to follow-up general surgery regarding the hernia.  Do not think additional work-up needed in the ED. ? ? ? ? ? ? ? ?Final Clinical Impression(s) / ED Diagnoses ?Final diagnoses:  ?Left inguinal hernia  ? ? ?Rx / DC Orders ?ED Discharge Orders   ? ? None  ? ?  ? ? ?  ?Theron Arista, PA-C ?11/27/21 1840 ? ?  ?Bethann Berkshire, MD ?11/28/21 1113 ? ?

## 2021-11-27 NOTE — ED Triage Notes (Signed)
Pt c/o left side groin pain after turning while at work ?

## 2021-11-28 LAB — GC/CHLAMYDIA PROBE AMP (~~LOC~~) NOT AT ARMC
Chlamydia: NEGATIVE
Comment: NEGATIVE
Comment: NORMAL
Neisseria Gonorrhea: NEGATIVE

## 2021-12-09 ENCOUNTER — Emergency Department (HOSPITAL_COMMUNITY)
Admission: EM | Admit: 2021-12-09 | Discharge: 2021-12-09 | Disposition: A | Discharge status: Home / Self Care | Payer: Self-pay | Attending: Emergency Medicine | Admitting: Emergency Medicine

## 2021-12-09 ENCOUNTER — Other Ambulatory Visit: Payer: Self-pay

## 2021-12-09 ENCOUNTER — Encounter (HOSPITAL_COMMUNITY): Payer: Self-pay | Admitting: Emergency Medicine

## 2021-12-09 DIAGNOSIS — F419 Anxiety disorder, unspecified: Secondary | ICD-10-CM | POA: Insufficient documentation

## 2021-12-09 MED ORDER — LORAZEPAM 0.5 MG PO TABS: 0.5000 mg | ORAL_TABLET | Freq: Three times a day (TID) | ORAL | 0 refills | Status: DC

## 2021-12-09 NOTE — Discharge Instructions (Addendum)
You may take the medicine prescribed if needed for anxiety episodes, use sparingly and be aware this may cause drowsiness.  Refer to the resources list - I have circled some good choices for follow up care. Return here for any worsening symptoms.

## 2021-12-09 NOTE — ED Provider Notes (Signed)
[facility] Provider Note   CSN: [id number] Arrival date & time: 12/09/21  1002     History {Add pertinent medical, surgical, social history, OB history to HPI:1} Chief Complaint  Patient presents with  . Anxiety    Marc [name] is a 26 y.o. male   The history is provided by the patient.      Home Medications Prior to Admission medications   Medication Sig Start Date End Date Taking? Authorizing Provider  carbamazepine (TEGRETOL) 200 MG tablet Take 0.5 tablets (100 mg total) by mouth 2 (two) times daily. For mood stabilization Patient not taking: Reported on 07/09/2021 03/13/20   Lindell Spar I, NP  cephALEXin (KEFLEX) 500 MG capsule Take 1 capsule (500 mg total) by mouth 4 (four) times daily. Patient not taking: Reported on 07/09/2021 03/28/21   Sherrill Raring, PA-C  doxycycline (VIBRAMYCIN) 100 MG capsule Take 1 capsule (100 mg total) by mouth 2 (two) times daily. Patient not taking: Reported on 07/09/2021 06/30/21   Fransico Meadow, PA-C  EPINEPHrine (EPIPEN 2-PAK) 0.3 mg/0.3 mL IJ SOAJ injection Inject 0.3 mLs (0.3 mg total) into the muscle as needed for anaphylaxis (For severe allergic reaction with difficulty breathing, difficulty swallowing, tongue or lip swelling). Patient not taking: Reported on 10/13/2021 12/30/19   Ezequiel Essex, MD  escitalopram (LEXAPRO) 20 MG tablet Take 1 tablet (20 mg total) by mouth daily. For mood stabilization Patient not taking: Reported on 07/09/2021 03/14/20   Lindell Spar I, NP  gabapentin (NEURONTIN) 400 MG capsule Take 1 capsule (400 mg total) by mouth 3 (three) times daily. For agitation Patient not taking: Reported on 07/09/2021 03/13/20   Lindell Spar I, NP  hydrOXYzine (ATARAX/VISTARIL) 10 MG tablet Take 1 tablet (10 mg total) by mouth 3 (three) times daily as needed for anxiety. Patient not taking: Reported on 07/09/2021 03/13/20   Lindell Spar I, NP  ibuprofen (ADVIL) 200 MG tablet Take 400 mg by mouth every 6  (six) hours as needed.    [provider]  nicotine (NICODERM CQ - DOSED IN MG/24 HOURS) 21 mg/24hr patch Place 1 patch (21 mg total) onto the skin daily. (May buy from over the counter): For smoking cessation Patient not taking: Reported on 07/09/2021 03/14/20   Lindell Spar I, NP  ondansetron (ZOFRAN) 4 MG tablet Take 1 tablet (4 mg total) by mouth every 6 (six) hours. Patient not taking: Reported on 11/27/2021 10/13/21   Marcello Fennel, PA-C  traZODone (DESYREL) 50 MG tablet Take 1 tablet (50 mg total) by mouth at bedtime as needed for sleep. Patient not taking: Reported on 07/09/2021 03/13/20   Lindell Spar I, NP      Allergies    Aspirin and Promethazine hcl    Review of Systems   Review of Systems  Physical Exam Updated Vital Signs BP 119/83   Pulse 66   Temp 98.8 F (37.1 C) (Oral)   Resp 18   Ht 5\' 7"  (1.702 m)   Wt 64 kg   SpO2 100%   BMI 22.11 kg/m  Physical Exam  ED Results / Procedures / Treatments   Labs (all labs ordered are listed, but only abnormal results are displayed) Labs Reviewed - No data to display  EKG None  Radiology No results found.  Procedures Procedures  {Document cardiac monitor, telemetry assessment procedure when appropriate:1}  Medications Ordered in ED Medications - No data to display  ED Course/ Medical Decision Making/ A&P  Medical Decision Making  ***  {Document critical care time when appropriate:1} {Document review of labs and clinical decision tools ie heart score, Chads2Vasc2 etc:1}  {Document your independent review of radiology images, and any outside records:1} {Document your discussion with family members, caretakers, and with consultants:1} {Document social determinants of health affecting pt's care:1} {Document your decision making why or why not admission, treatments were needed:1} Final Clinical Impression(s) / ED Diagnoses Final diagnoses:  None    Rx / DC Orders ED  Discharge Orders     None

## 2021-12-09 NOTE — ED Triage Notes (Signed)
Pt presents with anxiety due to new stressors, history of anxiety hasn't taken meds in  3 years (Vistaril). Anxiety greater than normal past 2 weeks, worse last 2 days with mood swings.

## 2021-12-09 NOTE — ED Notes (Signed)
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 Had a colonoscopy done on last week. Patient reports he has "black stool coming from his bladder".

## 2022-05-30 DIAGNOSIS — J029 Acute pharyngitis, unspecified: Secondary | ICD-10-CM | POA: Diagnosis not present

## 2022-05-30 DIAGNOSIS — R030 Elevated blood-pressure reading, without diagnosis of hypertension: Secondary | ICD-10-CM | POA: Diagnosis not present

## 2022-05-30 DIAGNOSIS — K0889 Other specified disorders of teeth and supporting structures: Secondary | ICD-10-CM | POA: Diagnosis not present

## 2022-05-30 DIAGNOSIS — Z6821 Body mass index (BMI) 21.0-21.9, adult: Secondary | ICD-10-CM | POA: Diagnosis not present

## 2022-06-27 DIAGNOSIS — Z6822 Body mass index (BMI) 22.0-22.9, adult: Secondary | ICD-10-CM | POA: Diagnosis not present

## 2022-06-27 DIAGNOSIS — F419 Anxiety disorder, unspecified: Secondary | ICD-10-CM | POA: Diagnosis not present

## 2022-07-15 ENCOUNTER — Emergency Department (HOSPITAL_COMMUNITY)
Admission: EM | Admit: 2022-07-15 | Discharge: 2022-07-15 | Disposition: A | Discharge status: Home / Self Care | Payer: BLUE CROSS/BLUE SHIELD | Attending: Emergency Medicine | Admitting: Emergency Medicine

## 2022-07-15 ENCOUNTER — Encounter (HOSPITAL_COMMUNITY): Payer: Self-pay

## 2022-07-15 ENCOUNTER — Other Ambulatory Visit: Payer: Self-pay

## 2022-07-15 DIAGNOSIS — J101 Influenza due to other identified influenza virus with other respiratory manifestations: Secondary | ICD-10-CM | POA: Diagnosis not present

## 2022-07-15 DIAGNOSIS — Z20822 Contact with and (suspected) exposure to covid-19: Secondary | ICD-10-CM | POA: Diagnosis not present

## 2022-07-15 DIAGNOSIS — R509 Fever, unspecified: Secondary | ICD-10-CM | POA: Diagnosis not present

## 2022-07-15 DIAGNOSIS — J111 Influenza due to unidentified influenza virus with other respiratory manifestations: Secondary | ICD-10-CM

## 2022-07-15 LAB — RESP PANEL BY RT-PCR (RSV, FLU A&B, COVID)  RVPGX2
Influenza A by PCR: NEGATIVE
Influenza B by PCR: POSITIVE — AB
Resp Syncytial Virus by PCR: NEGATIVE
SARS Coronavirus 2 by RT PCR: NEGATIVE

## 2022-07-15 MED ORDER — ONDANSETRON 4 MG PO TBDP: 4.0000 mg | ORAL_TABLET | Freq: Three times a day (TID) | ORAL | 0 refills | Status: DC | PRN

## 2022-07-15 MED ORDER — ONDANSETRON 8 MG PO TBDP
8.0000 mg | ORAL_TABLET | Freq: Once | ORAL | Status: AC
  Administered 2022-07-15: 8 mg via ORAL
  Filled 2022-07-15: qty 1

## 2022-07-15 MED ORDER — ACETAMINOPHEN 500 MG PO TABS
1000.0000 mg | ORAL_TABLET | Freq: Once | ORAL | Status: AC
  Administered 2022-07-15: 1000 mg via ORAL
  Filled 2022-07-15: qty 2

## 2022-07-15 NOTE — ED Notes (Signed)
Pt has kept a cup of water down. Pt states nausea much better

## 2022-07-15 NOTE — ED Triage Notes (Signed)
Patient states HOB, diarrhea, cough, fever and body aches since Sunday. Covid test at home was negative. Denies flu exposure.

## 2022-07-15 NOTE — ED Notes (Signed)
Pt has been able to hold down sips of water today. Mm wet. Nad.

## 2022-07-15 NOTE — Discharge Instructions (Addendum)
You have influenza this is a viral illness that is very important to hydrate and in order to do this he will need to have Zofran around-the-clock in other words every 8 hours for the next 24 hours.  After that you can take as needed for nausea.  Make sure you are drinking plenty of water at least 8 glasses of water and I recommend Pedialyte or Gatorade as this is a balance solution.  Please use Tylenol or ibuprofen for pain.  You may use 600 mg ibuprofen every 6 hours or 1000 mg of Tylenol every 6 hours.  You may choose to alternate between the 2.  This would be most effective.  Not to exceed 4 g of Tylenol within 24 hours.  Not to exceed 3200 mg ibuprofen 24 hours.  Return to the ER for new or concerning symptoms otherwise please follow-up with your primary care doctor    Viral Illness TREATMENT  Treatment is directed at relieving symptoms. There is no cure. Antibiotics are not effective, because the infection is caused by a virus, not by bacteria. Treatment may include:  Increased fluid intake. Sports drinks offer valuable electrolytes, sugars, and fluids.  Breathing heated mist or steam (vaporizer or shower).  Eating chicken soup or other clear broths, and maintaining good nutrition.  Getting plenty of rest.  Using gargles or lozenges for comfort.  Increasing usage of your inhaler if you have asthma.  Return to work when your temperature has returned to normal.  Gargle warm salt water and spit it out for sore throat. Take benadryl to decrease sinus secretions. Continue to alternate between Tylenol and ibuprofen for pain and fever control.  Follow Up: Follow up with your primary care doctor in 5-7 days for recheck of ongoing symptoms.  Return to emergency department for emergent changing or worsening of symptoms.

## 2022-07-15 NOTE — ED Notes (Signed)
Water given to sip for oral trial.

## 2022-07-15 NOTE — ED Provider Notes (Signed)
[facility] EMERGENCY DEPARTMENT Provider Note   CSN: [id number] Arrival date & time: 07/15/22  1253     History  Chief Complaint  Patient presents with   Cough   Fever   Shortness of Breath    Marc [name] is a 26 y.o. male.   Cough Associated symptoms: fever and shortness of breath   Fever Associated symptoms: cough   Shortness of Breath Associated symptoms: cough and fever   Patient is a 26 year old male present emergency room today with complaints of headache, diarrhea, cough, fevers, myalgias, fatigue, sinus congestion since 4 days ago.  No chest pain.  No difficulty ambulating.  Not vaccinated against influenza.     Home Medications Prior to Admission medications   Medication Sig Start Date End Date Taking? Authorizing Provider  ondansetron (ZOFRAN-ODT) 4 MG disintegrating tablet Take 1 tablet (4 mg total) by mouth every 8 (eight) hours as needed for nausea or vomiting. 07/15/22  Yes Areyana Leoni S, PA  carbamazepine (TEGRETOL) 200 MG tablet Take 0.5 tablets (100 mg total) by mouth 2 (two) times daily. For mood stabilization Patient not taking: Reported on 07/09/2021 03/13/20   Armandina Stammer I, NP  cephALEXin (KEFLEX) 500 MG capsule Take 1 capsule (500 mg total) by mouth 4 (four) times daily. Patient not taking: Reported on 07/09/2021 03/28/21   Theron Arista, PA-C  doxycycline (VIBRAMYCIN) 100 MG capsule Take 1 capsule (100 mg total) by mouth 2 (two) times daily. Patient not taking: Reported on 07/09/2021 06/30/21   Elson Areas, PA-C  EPINEPHrine (EPIPEN 2-PAK) 0.3 mg/0.3 mL IJ SOAJ injection Inject 0.3 mLs (0.3 mg total) into the muscle as needed for anaphylaxis (For severe allergic reaction with difficulty breathing, difficulty swallowing, tongue or lip swelling). Patient not taking: Reported on 10/13/2021 12/30/19   Glynn Octave, MD  escitalopram (LEXAPRO) 20 MG tablet Take 1 tablet (20 mg total) by mouth daily. For mood stabilization Patient not  taking: Reported on 07/09/2021 03/14/20   Armandina Stammer I, NP  gabapentin (NEURONTIN) 400 MG capsule Take 1 capsule (400 mg total) by mouth 3 (three) times daily. For agitation Patient not taking: Reported on 07/09/2021 03/13/20   Armandina Stammer I, NP  hydrOXYzine (ATARAX/VISTARIL) 10 MG tablet Take 1 tablet (10 mg total) by mouth 3 (three) times daily as needed for anxiety. Patient not taking: Reported on 07/09/2021 03/13/20   Armandina Stammer I, NP  ibuprofen (ADVIL) 200 MG tablet Take 400 mg by mouth every 6 (six) hours as needed.    [provider]  LORazepam (ATIVAN) 0.5 MG tablet Take 1 tablet (0.5 mg total) by mouth every 8 (eight) hours. 12/09/21   Burgess Amor, PA-C  nicotine (NICODERM CQ - DOSED IN MG/24 HOURS) 21 mg/24hr patch Place 1 patch (21 mg total) onto the skin daily. (May buy from over the counter): For smoking cessation Patient not taking: Reported on 07/09/2021 03/14/20   Armandina Stammer I, NP  ondansetron (ZOFRAN) 4 MG tablet Take 1 tablet (4 mg total) by mouth every 6 (six) hours. Patient not taking: Reported on 11/27/2021 10/13/21   Carroll Sage, PA-C  traZODone (DESYREL) 50 MG tablet Take 1 tablet (50 mg total) by mouth at bedtime as needed for sleep. Patient not taking: Reported on 07/09/2021 03/13/20   Armandina Stammer I, NP      Allergies    Aspirin and Promethazine hcl    Review of Systems   Review of Systems  Constitutional:  Positive for fever.  Respiratory:  Positive for cough and shortness of breath.     Physical Exam Updated Vital Signs BP (!) 136/91 (BP Location: Right Arm)   Pulse 88   Temp 99.1 F (37.3 C) (Oral)   Resp 19   Ht 5\' 7"  (1.702 m)   Wt 61.2 kg   SpO2 100%   BMI 21.14 kg/m  Physical Exam Vitals and nursing note reviewed.  Constitutional:      General: He is not in acute distress.    Comments: 26 year old male in no acute distress nontoxic-appearing laying in bed  HENT:     Head: Normocephalic and atraumatic.     Nose: Nose normal.   Eyes:     General: No scleral icterus. Cardiovascular:     Rate and Rhythm: Normal rate and regular rhythm.     Pulses: Normal pulses.     Heart sounds: Normal heart sounds.  Pulmonary:     Effort: Pulmonary effort is normal. No respiratory distress.     Breath sounds: No wheezing.     Comments: Lungs are clear to auscultation, speaking in full sentences Abdominal:     Palpations: Abdomen is soft.     Tenderness: There is no abdominal tenderness.  Musculoskeletal:     Cervical back: Normal range of motion.     Right lower leg: No edema.     Left lower leg: No edema.  Skin:    General: Skin is warm and dry.     Capillary Refill: Capillary refill takes less than 2 seconds.  Neurological:     Mental Status: He is alert. Mental status is at baseline.  Psychiatric:        Mood and Affect: Mood normal.        Behavior: Behavior normal.     ED Results / Procedures / Treatments   Labs (all labs ordered are listed, but only abnormal results are displayed) Labs Reviewed  RESP PANEL BY RT-PCR (RSV, FLU A&B, COVID)  RVPGX2 - Abnormal; Notable for the following components:      Result Value   Influenza B by PCR POSITIVE (*)    All other components within normal limits    EKG None  Radiology No results found.  Procedures Procedures    Medications Ordered in ED Medications  ondansetron (ZOFRAN-ODT) disintegrating tablet 8 mg (8 mg Oral Given 07/15/22 1730)  acetaminophen (TYLENOL) tablet 1,000 mg (1,000 mg Oral Given 07/15/22 1747)    ED Course/ Medical Decision Making/ A&P                           Medical Decision Making Risk OTC drugs. Prescription drug management.   Patient is a 26 year old male present emergency room today with complaints of headache, diarrhea, cough, fevers, myalgias, fatigue, sinus congestion since 4 days ago.  No chest pain.  No difficulty ambulating.  Not vaccinated against influenza.  He was having some episodes of nonbloody nonbilious  emesis.  Will provide Zofran, Tylenol  PCR positive for influenza B  Physical exam is quite benign.  Tolerating p.o. now.  Ambulatory  Tylenol ibuprofen recommendations given, Zofran prescribed.  Will follow-up with PCP.  Return precautions discussed  Final Clinical Impression(s) / ED Diagnoses Final diagnoses:  Influenza    Rx / DC Orders ED Discharge Orders          Ordered    ondansetron (ZOFRAN-ODT) 4 MG disintegrating tablet  Every 8 hours PRN  07/15/22 1727              Gailen Shelter, PA 07/15/22 1829    Franne Forts, DO 07/17/22 0129

## 2022-10-28 DIAGNOSIS — F411 Generalized anxiety disorder: Secondary | ICD-10-CM | POA: Diagnosis not present

## 2022-10-28 DIAGNOSIS — Z6822 Body mass index (BMI) 22.0-22.9, adult: Secondary | ICD-10-CM | POA: Diagnosis not present

## 2022-10-28 DIAGNOSIS — R030 Elevated blood-pressure reading, without diagnosis of hypertension: Secondary | ICD-10-CM | POA: Diagnosis not present

## 2022-10-28 DIAGNOSIS — R519 Headache, unspecified: Secondary | ICD-10-CM | POA: Diagnosis not present

## 2022-11-19 ENCOUNTER — Emergency Department (HOSPITAL_COMMUNITY): Payer: BLUE CROSS/BLUE SHIELD

## 2022-11-19 ENCOUNTER — Emergency Department (HOSPITAL_COMMUNITY)
Admission: EM | Admit: 2022-11-19 | Discharge: 2022-11-19 | Disposition: A | Discharge status: Home / Self Care | Payer: BLUE CROSS/BLUE SHIELD | Attending: Student | Admitting: Student

## 2022-11-19 ENCOUNTER — Encounter (HOSPITAL_COMMUNITY): Payer: Self-pay | Admitting: *Deleted

## 2022-11-19 ENCOUNTER — Other Ambulatory Visit: Payer: Self-pay

## 2022-11-19 DIAGNOSIS — R079 Chest pain, unspecified: Secondary | ICD-10-CM | POA: Diagnosis not present

## 2022-11-19 DIAGNOSIS — E860 Dehydration: Secondary | ICD-10-CM | POA: Insufficient documentation

## 2022-11-19 DIAGNOSIS — R1084 Generalized abdominal pain: Secondary | ICD-10-CM | POA: Diagnosis not present

## 2022-11-19 DIAGNOSIS — R1110 Vomiting, unspecified: Secondary | ICD-10-CM | POA: Diagnosis not present

## 2022-11-19 DIAGNOSIS — B349 Viral infection, unspecified: Secondary | ICD-10-CM

## 2022-11-19 DIAGNOSIS — R112 Nausea with vomiting, unspecified: Secondary | ICD-10-CM

## 2022-11-19 DIAGNOSIS — R109 Unspecified abdominal pain: Secondary | ICD-10-CM | POA: Diagnosis not present

## 2022-11-19 LAB — CBC WITH DIFFERENTIAL/PLATELET
Abs Immature Granulocytes: 0.03 10*3/uL (ref 0.00–0.07)
Basophils Absolute: 0 10*3/uL (ref 0.0–0.1)
Basophils Relative: 0 %
Eosinophils Absolute: 0.2 10*3/uL (ref 0.0–0.5)
Eosinophils Relative: 2 %
HCT: 47.4 % (ref 39.0–52.0)
Hemoglobin: 16.9 g/dL (ref 13.0–17.0)
Immature Granulocytes: 0 %
Lymphocytes Relative: 26 %
Lymphs Abs: 2.6 10*3/uL (ref 0.7–4.0)
MCH: 32.4 pg (ref 26.0–34.0)
MCHC: 35.7 g/dL (ref 30.0–36.0)
MCV: 90.8 fL (ref 80.0–100.0)
Monocytes Absolute: 0.6 10*3/uL (ref 0.1–1.0)
Monocytes Relative: 6 %
Neutro Abs: 6.4 10*3/uL (ref 1.7–7.7)
Neutrophils Relative %: 66 %
Platelets: 245 10*3/uL (ref 150–400)
RBC: 5.22 MIL/uL (ref 4.22–5.81)
RDW: 12.1 % (ref 11.5–15.5)
WBC: 9.9 10*3/uL (ref 4.0–10.5)
nRBC: 0 % (ref 0.0–0.2)

## 2022-11-19 LAB — URINALYSIS, ROUTINE W REFLEX MICROSCOPIC
Bilirubin Urine: NEGATIVE
Glucose, UA: NEGATIVE mg/dL
Hgb urine dipstick: NEGATIVE
Ketones, ur: 20 mg/dL — AB
Leukocytes,Ua: NEGATIVE
Nitrite: NEGATIVE
Protein, ur: NEGATIVE mg/dL
Specific Gravity, Urine: >1.046 — ABNORMAL HIGH (ref 1.005–1.030)
pH: 7 (ref 5.0–8.0)

## 2022-11-19 LAB — TROPONIN I (HIGH SENSITIVITY): Troponin I (High Sensitivity): <2 ng/L (ref <18)

## 2022-11-19 LAB — COMPREHENSIVE METABOLIC PANEL
ALT: 38 U/L (ref 0–44)
AST: 30 U/L (ref 15–41)
Albumin: 4.7 g/dL (ref 3.5–5.0)
Alkaline Phosphatase: 54 U/L (ref 38–126)
Anion gap: 12 (ref 5–15)
BUN: 13 mg/dL (ref 6–20)
CO2: 21 mmol/L — ABNORMAL LOW (ref 22–32)
Calcium: 9.4 mg/dL (ref 8.9–10.3)
Chloride: 100 mmol/L (ref 98–111)
Creatinine, Ser: 1.01 mg/dL (ref 0.61–1.24)
GFR, Estimated: >60 mL/min (ref >60)
Glucose, Bld: 117 mg/dL — ABNORMAL HIGH (ref 70–99)
Potassium: 3.5 mmol/L (ref 3.5–5.1)
Sodium: 133 mmol/L — ABNORMAL LOW (ref 135–145)
Total Bilirubin: 1 mg/dL (ref 0.3–1.2)
Total Protein: 8 g/dL (ref 6.5–8.1)

## 2022-11-19 LAB — RAPID URINE DRUG SCREEN, HOSP PERFORMED
Amphetamines: NOT DETECTED
Barbiturates: NOT DETECTED
Benzodiazepines: NOT DETECTED
Cocaine: NOT DETECTED
Opiates: POSITIVE — AB
Tetrahydrocannabinol: POSITIVE — AB

## 2022-11-19 LAB — MAGNESIUM: Magnesium: 1.9 mg/dL (ref 1.7–2.4)

## 2022-11-19 LAB — LIPASE, BLOOD: Lipase: 37 U/L (ref 11–51)

## 2022-11-19 MED ORDER — SODIUM CHLORIDE 0.9 % IV BOLUS
1000.0000 mL | Freq: Once | INTRAVENOUS | Status: AC
  Administered 2022-11-19: 1000 mL via INTRAVENOUS

## 2022-11-19 MED ORDER — ONDANSETRON HCL 4 MG/2ML IJ SOLN
4.0000 mg | Freq: Once | INTRAMUSCULAR | Status: AC
  Administered 2022-11-19: 4 mg via INTRAVENOUS
  Filled 2022-11-19: qty 2

## 2022-11-19 MED ORDER — ONDANSETRON HCL 4 MG PO TABS: 4.0000 mg | ORAL_TABLET | Freq: Three times a day (TID) | ORAL | 0 refills | Status: DC | PRN

## 2022-11-19 MED ORDER — MORPHINE SULFATE (PF) 4 MG/ML IV SOLN
2.0000 mg | Freq: Once | INTRAVENOUS | Status: AC
  Administered 2022-11-19: 2 mg via INTRAVENOUS
  Filled 2022-11-19: qty 1

## 2022-11-19 MED ORDER — DICYCLOMINE HCL 20 MG PO TABS: 20.0000 mg | ORAL_TABLET | Freq: Two times a day (BID) | ORAL | 0 refills | Status: AC

## 2022-11-19 MED ORDER — ONDANSETRON HCL 4 MG PO TABS: 4.0000 mg | ORAL_TABLET | Freq: Three times a day (TID) | ORAL | 0 refills | Status: AC | PRN

## 2022-11-19 MED ORDER — MORPHINE SULFATE (PF) 4 MG/ML IV SOLN
4.0000 mg | Freq: Once | INTRAVENOUS | Status: AC
  Administered 2022-11-19: 4 mg via INTRAVENOUS
  Filled 2022-11-19: qty 1

## 2022-11-19 MED ORDER — IOHEXOL 300 MG/ML  SOLN
100.0000 mL | Freq: Once | INTRAMUSCULAR | Status: AC | PRN
  Administered 2022-11-19: 100 mL via INTRAVENOUS

## 2022-11-19 NOTE — ED Triage Notes (Signed)
Pt with abd pain with N/V x 3 days.  LBM PTA and normal per pt. Increase in urination.  Pt with bilateral flank pain and mid chest pain as well.

## 2022-11-19 NOTE — Discharge Instructions (Addendum)
Thank you for letting us take care of you today.  Overall, your workup was reassuring including your EKG, chest x-ray, blood work, urine, and CT scan.  We do not see any signs of infection, damage to your heart, or problems in your abdomen that are surgical or require admission to the hospital.  You likely have a viral syndrome causing your symptoms.  These are treated symptomatically.  I prescribe Zofran for home to help with nausea.  You may take over-the-counter medication as needed to help with pain such as Tylenol or ibuprofen.  You may take up to 1000 mg Tylenol and/or 600 mg ibuprofen every 6 hours as needed.  Do not take more than 4000 mg Tylenol or 3200 mg ibuprofen in 24 hours.  Also prescribed a medication called Bentyl to help with abdominal cramping or pain.  I do think you would benefit from primary care follow-up.  I provided 2 clinics that you may call to establish care with or you may go to a PCP of your own choosing.  For any continued symptoms, please follow-up with them as you may require referral to a specialist for further evaluation if you continue to have symptoms.  For any new or worsening symptoms, please return to the nearest emergency department for reevaluation.

## 2022-11-19 NOTE — ED Provider Notes (Signed)
[place] EMERGENCY DEPARTMENT AT [facility] Provider Note   CSN: [id number] Arrival date & time: 11/19/22  1319     History  Chief Complaint  Patient presents with   Abdominal Pain    Marc [name] is a 27 y.o. male with past medical history anxiety, depression, seizure disorder who presents to the ED complaining of abdominal pain that started yesterday.  He complains of generalized abdominal pain, lower back pain, nausea, vomiting, chest pain, and shortness of breath.  He states he has had approximately 5 episodes of nonbloody emesis today.  No associated fever, chills, or diarrhea.  Notes he has also had some dysuria but no hematuria.  Last bowel movement was this morning but was minimal and patient notes he has recently been constipated.  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  Patient unaware of reason for his colostomy.  Also unaware of any previous abdominal surgeries though does note he has had some.  Patient has taken ibuprofen and Tylenol at home without relief.  No known sick contacts.  He denies history of alcohol abuse.  Patient states he has had similar symptoms many times before the but is unsure what caused them.        Home Medications Prior to Admission medications   Medication Sig Start Date End Date Taking? Authorizing Provider  dicyclomine (BENTYL) 20 MG tablet Take 1 tablet (20 mg total) by mouth 2 (two) times daily for 5 days. 11/19/22 11/24/22 Yes Jacobus Colvin L, PA-C  ondansetron (ZOFRAN) 4 MG tablet Take 1 tablet (4 mg total) by mouth every 8 (eight) hours as needed for up to 5 days for nausea or vomiting. 11/19/22 11/24/22  Milledge Gerding, Dow Adolph L, PA-C      Allergies    Aspirin and Promethazine hcl    Review of Systems   Review of Systems  All other systems reviewed and are negative.   Physical Exam Updated Vital Signs BP 123/76 (BP Location: Left Arm)   Pulse 100   Temp 97.9 F (36.6 C) (Oral)    Resp (!) 22   Ht 5\' 5"  (1.651 m)   Wt 63.5 kg   SpO2 100%   BMI 23.30 kg/m  Physical Exam Vitals and nursing note reviewed.  Constitutional:      General: He is not in acute distress.    Appearance: Normal appearance. He is not ill-appearing, toxic-appearing or diaphoretic.  HENT:     Head: Normocephalic and atraumatic.     Mouth/Throat:     Comments: Moderately dry mucous membranes Eyes:     General: No scleral icterus.    Extraocular Movements: Extraocular movements intact.     Conjunctiva/sclera: Conjunctivae normal.     Pupils: Pupils are equal, round, and reactive to light.  Cardiovascular:     Rate and Rhythm: Normal rate and regular rhythm.     Heart sounds: No murmur heard. Pulmonary:     Effort: Pulmonary effort is normal. No respiratory distress.     Breath sounds: Normal breath sounds. No stridor. No wheezing, rhonchi or rales.  Chest:     Chest wall: No tenderness.  Abdominal:     General: Abdomen is flat. A surgical scar is present.     Palpations: Abdomen is soft. There is no shifting dullness, fluid wave, hepatomegaly, splenomegaly, mass or pulsatile mass.     Tenderness: There is generalized abdominal tenderness. There is no right CVA tenderness, left CVA  tenderness, guarding or rebound. Negative signs include Murphy's sign, Rovsing's sign and McBurney's sign.  Musculoskeletal:        General: Normal range of motion.     Cervical back: Neck supple.     Right lower leg: No edema.     Left lower leg: No edema.  Skin:    General: Skin is warm and dry.     Capillary Refill: Capillary refill takes less than 2 seconds.     Findings: No rash.  Neurological:     General: No focal deficit present.     Mental Status: He is alert and oriented to person, place, and time. Mental status is at baseline.  Psychiatric:        Mood and Affect: Mood is anxious.        Speech: Speech normal.        Behavior: Behavior is cooperative.        Thought Content: Thought  content normal.     ED Results / Procedures / Treatments   Labs (all labs ordered are listed, but only abnormal results are displayed) Labs Reviewed  COMPREHENSIVE METABOLIC PANEL - Abnormal; Notable for the following components:      Result Value   Sodium 133 (*)    CO2 21 (*)    Glucose, Bld 117 (*)    All other components within normal limits  URINALYSIS, ROUTINE W REFLEX MICROSCOPIC - Abnormal; Notable for the following components:   Color, Urine STRAW (*)    Specific Gravity, Urine >1.046 (*)    Ketones, ur 20 (*)    All other components within normal limits  RAPID URINE DRUG SCREEN, HOSP PERFORMED - Abnormal; Notable for the following components:   Opiates POSITIVE (*)    Tetrahydrocannabinol POSITIVE (*)    All other components within normal limits  CBC WITH DIFFERENTIAL/PLATELET  LIPASE, BLOOD  MAGNESIUM  TROPONIN I (HIGH SENSITIVITY)    EKG Sinus rhythm, right axis deviation, nonspecific T wave changes, no acute STEMI  Radiology CT ABDOMEN PELVIS W CONTRAST  Result Date: 11/19/2022 CLINICAL DATA:  Acute abdominal pain with nausea and vomiting EXAM: CT ABDOMEN AND PELVIS WITH CONTRAST TECHNIQUE: Multidetector CT imaging of the abdomen and pelvis was performed using the standard protocol following bolus administration of intravenous contrast. RADIATION DOSE REDUCTION: This exam was performed according to the departmental dose-optimization program which includes automated exposure control, adjustment of the mA and/or kV according to patient size and/or use of iterative reconstruction technique. CONTRAST:  OMNIPAQUE IOHEXOL 300 MG/ML  SOLN COMPARISON:  CT abdomen and pelvis 06/19/2019 FINDINGS: Lower chest: No acute abnormality. Hepatobiliary: No focal liver abnormality is seen. No gallstones, gallbladder wall thickening, or biliary dilatation. Pancreas: Unremarkable. No pancreatic ductal dilatation or surrounding inflammatory changes. Spleen: Normal in size without focal  abnormality. Adrenals/Urinary Tract: Adrenal glands are unremarkable. Kidneys are normal, without renal calculi, focal lesion, or hydronephrosis. Bladder is unremarkable. Stomach/Bowel: Stomach is within normal limits. Appendix is not seen. No evidence of bowel wall thickening, distention, or inflammatory changes. Vascular/Lymphatic: No significant vascular findings are present. No enlarged abdominal or pelvic lymph nodes. Reproductive: Prostate is unremarkable. Other: No abdominal wall hernia or abnormality. No abdominopelvic ascites. Musculoskeletal: No acute or significant osseous findings. IMPRESSION: 1. No evidence of acute abnormality within the abdomen or pelvis. Electronically Signed   By: Darliss Cheney M.D.   On: 11/19/2022 17:00   DG Chest 2 View  Result Date: 11/19/2022 CLINICAL DATA:  Chest pain EXAM: CHEST -  2 VIEW COMPARISON:  None Available. FINDINGS: The heart size and mediastinal contours are within normal limits. Both lungs are clear. The visualized skeletal structures are unremarkable. IMPRESSION: No focal airspace opacity. Electronically Signed   By: Lorenza Cambridge M.D.   On: 11/19/2022 14:07    Procedures Procedures    Medications Ordered in ED Medications  ondansetron (ZOFRAN) injection 4 mg (4 mg Intravenous Given 11/19/22 1414)  sodium chloride 0.9 % bolus 1,000 mL (0 mLs Intravenous Stopped 11/19/22 1646)  morphine (PF) 4 MG/ML injection 4 mg (4 mg Intravenous Given 11/19/22 1414)  iohexol (OMNIPAQUE) 300 MG/ML solution 100 mL (100 mLs Intravenous Contrast Given 11/19/22 1635)  ondansetron (ZOFRAN) injection 4 mg (4 mg Intravenous Given 11/19/22 1651)  morphine (PF) 4 MG/ML injection 2 mg (2 mg Intravenous Given 11/19/22 1725)    ED Course/ Medical Decision Making/ A&P                             Medical Decision Making Amount and/or Complexity of Data Reviewed Labs: ordered. Decision-making details documented in ED Course. Radiology: ordered. Decision-making details documented  in ED Course. ECG/medicine tests: ordered. Decision-making details documented in ED Course.  Risk Prescription drug management.   Medical Decision Making:   Marc [name] is a 27 y.o. male who presented to the ED today with abdominal pain detailed above.    Additional history discussed with patient's family/caregivers.  Patient placed on continuous vitals and telemetry monitoring while in ED which was reviewed periodically.  Complete initial physical exam performed, notably the patient  was with generalized abdominal tenderness.  No rebound, guarding, or peritoneal signs.  No CVA tenderness.  Patient very anxious but answering questions appropriately and neurologically intact.  Moderately dry mucous membranes.    Reviewed and confirmed nursing documentation for past medical history, family history, social history.    Initial Assessment:   With the patient's presentation of abdominal pain, differential diagnosis includes but is not limited to AAA, mesenteric ischemia, appendicitis, diverticulitis, DKA, gastritis, gastroenteritis, AMI, nephrolithiasis, pancreatitis, peritonitis, adrenal insufficiency, intestinal ischemia, constipation, UTI, SBO/LBO, splenic rupture, biliary disease, IBD, IBS, PUD, hepatitis, STD, ovarian/testicular torsion, electrolyte disturbance, DKA, dehydration, acute kidney injury, renal failure, cholecystitis, cholelithiasis, choledocholithiasis, abdominal pain of  unknown etiology.   Initial Plan:  Screening labs including CBC and Metabolic panel to evaluate for infectious or metabolic etiology of disease.  Lipase to evaluate for pancreatitis Urinalysis with reflex culture and UDS ordered to evaluate for UTI or relevant urologic/nephrologic pathology.  CT abd/pelvis to evaluate for intra-abdominal pathology EKG, troponin, chest x-ray to evaluate for cardiac pathology Symptomatic management Objective evaluation as reviewed   Initial Study Results:   Laboratory   All laboratory results reviewed without evidence of clinically relevant pathology.   Exceptions include: Sodium 133, glucose 117, UDS positive for THC and opiates (no opiates given in the ED prior to urine sample being provided in pregnancy, UA positive for ketones but no leukocytes or nitrites  EKG EKG was reviewed independently. Sinus rhythm, right axis deviation, nonspecific T wave changes, no acute STEMI  Radiology:  All images reviewed independently. Agree with radiology report at this time.   CT ABDOMEN PELVIS W CONTRAST  Result Date: 11/19/2022 CLINICAL DATA:  Acute abdominal pain with nausea and vomiting EXAM: CT ABDOMEN AND PELVIS WITH CONTRAST TECHNIQUE: Multidetector CT imaging of the abdomen and pelvis was performed using the standard protocol following bolus administration of intravenous contrast. RADIATION  DOSE REDUCTION: This exam was performed according to the departmental dose-optimization program which includes automated exposure control, adjustment of the mA and/or kV according to patient size and/or use of iterative reconstruction technique. CONTRAST:  OMNIPAQUE IOHEXOL 300 MG/ML  SOLN COMPARISON:  CT abdomen and pelvis 06/19/2019 FINDINGS: Lower chest: No acute abnormality. Hepatobiliary: No focal liver abnormality is seen. No gallstones, gallbladder wall thickening, or biliary dilatation. Pancreas: Unremarkable. No pancreatic ductal dilatation or surrounding inflammatory changes. Spleen: Normal in size without focal abnormality. Adrenals/Urinary Tract: Adrenal glands are unremarkable. Kidneys are normal, without renal calculi, focal lesion, or hydronephrosis. Bladder is unremarkable. Stomach/Bowel: Stomach is within normal limits. Appendix is not seen. No evidence of bowel wall thickening, distention, or inflammatory changes. Vascular/Lymphatic: No significant vascular findings are present. No enlarged abdominal or pelvic lymph nodes. Reproductive: Prostate is unremarkable.  Other: No abdominal wall hernia or abnormality. No abdominopelvic ascites. Musculoskeletal: No acute or significant osseous findings. IMPRESSION: 1. No evidence of acute abnormality within the abdomen or pelvis. Electronically Signed   By: Darliss Cheney M.D.   On: 11/19/2022 17:00   DG Chest 2 View  Result Date: 11/19/2022 CLINICAL DATA:  Chest pain EXAM: CHEST - 2 VIEW COMPARISON:  None Available. FINDINGS: The heart size and mediastinal contours are within normal limits. Both lungs are clear. The visualized skeletal structures are unremarkable. IMPRESSION: No focal airspace opacity. Electronically Signed   By: Lorenza Cambridge M.D.   On: 11/19/2022 14:07       Final Assessment and Plan:   27 year old male presenting to the ED with many symptoms as above, most notably abdominal pain and vomiting.  Patient reports history of recurrent similar symptoms but states they have been severe since onset that brought him to the ED today.  On exam, patient is very anxious and has generalized abdominal tenderness and moderately dry mucous membranes.  He does have a previous surgical scar to the abdomen.  He notes that he has had multiple previous abdominal surgeries including colostomy and is a poor historian and provide" he is required 70 abdominal surgeries.  He notes that none of these were recent.  Questionable history of constipation as well.  Initiated as above for assessment of patient's symptoms.  Patient does appear slightly dehydrated but overall lab workup is very reassuring including a normal EKG and negative troponin.  No significant electrolyte disturbance.  No AKI.  Normal liver function.  No signs of UTI.  UDS positive for THC and opiates that patient received opiates prior to providing sample.  CT without acute abnormality.  Patient with good symptomatic relief with medications given in the ED.  Suspect viral syndrome contributing to patient's symptoms in the setting of possible chronic abdominal pain as  well.  Advised patient to closely follow-up with primary care and/or discussed being referred to GI once he establishes primary care.  Provided primary care that he may see.  Provided symptomatic relief for home as well and encouraged good hydration.  Patient expressed understanding of plan.  Strict ED return precautions given, all questions answered, and stable for discharge.   Clinical Impression:  1. Viral syndrome   2. Dehydration   3. Nausea and vomiting, unspecified vomiting type      Discharge           Final Clinical Impression(s) / ED Diagnoses Final diagnoses:  Viral syndrome  Dehydration  Nausea and vomiting, unspecified vomiting type    Rx / DC Orders ED Discharge Orders  Ordered    ondansetron (ZOFRAN) 4 MG tablet  Every 8 hours PRN,   Status:  Discontinued        11/19/22 1901    dicyclomine (BENTYL) 20 MG tablet  2 times daily        11/19/22 1907    ondansetron (ZOFRAN) 4 MG tablet  Every 8 hours PRN,   Status:  Discontinued        11/19/22 1914    ondansetron (ZOFRAN) 4 MG tablet  Every 8 hours PRN        11/19/22 1915              Richardson Dopp 11/19/22 2301    Glendora Score, MD 12/07/22 Rickey Primus

## 2022-12-22 DIAGNOSIS — F410 Panic disorder [episodic paroxysmal anxiety] without agoraphobia: Secondary | ICD-10-CM | POA: Diagnosis not present

## 2022-12-22 DIAGNOSIS — F909 Attention-deficit hyperactivity disorder, unspecified type: Secondary | ICD-10-CM | POA: Diagnosis not present

## 2022-12-22 DIAGNOSIS — F419 Anxiety disorder, unspecified: Secondary | ICD-10-CM | POA: Diagnosis not present

## 2022-12-22 DIAGNOSIS — R633 Feeding difficulties, unspecified: Secondary | ICD-10-CM | POA: Diagnosis not present

## 2023-08-02 DIAGNOSIS — H6691 Otitis media, unspecified, right ear: Secondary | ICD-10-CM | POA: Diagnosis not present

## 2023-08-02 DIAGNOSIS — J209 Acute bronchitis, unspecified: Secondary | ICD-10-CM | POA: Diagnosis not present

## 2023-08-02 DIAGNOSIS — R509 Fever, unspecified: Secondary | ICD-10-CM | POA: Diagnosis not present

## 2023-08-02 DIAGNOSIS — R059 Cough, unspecified: Secondary | ICD-10-CM | POA: Diagnosis not present

## 2024-02-10 ENCOUNTER — Other Ambulatory Visit (HOSPITAL_COMMUNITY): Payer: Self-pay

## 2024-02-10 DIAGNOSIS — M542 Cervicalgia: Secondary | ICD-10-CM

## 2024-02-10 DIAGNOSIS — Z113 Encounter for screening for infections with a predominantly sexual mode of transmission: Secondary | ICD-10-CM | POA: Diagnosis not present

## 2024-02-10 DIAGNOSIS — F419 Anxiety disorder, unspecified: Secondary | ICD-10-CM | POA: Diagnosis not present

## 2024-02-10 DIAGNOSIS — Z0001 Encounter for general adult medical examination with abnormal findings: Secondary | ICD-10-CM | POA: Diagnosis not present

## 2024-02-10 DIAGNOSIS — K469 Unspecified abdominal hernia without obstruction or gangrene: Secondary | ICD-10-CM | POA: Diagnosis not present

## 2024-02-10 DIAGNOSIS — R633 Feeding difficulties, unspecified: Secondary | ICD-10-CM | POA: Diagnosis not present

## 2024-02-18 DIAGNOSIS — F419 Anxiety disorder, unspecified: Secondary | ICD-10-CM | POA: Diagnosis not present

## 2024-03-02 ENCOUNTER — Ambulatory Visit: Admitting: Surgery

## 2024-03-14 ENCOUNTER — Ambulatory Visit: Admitting: General Surgery

## 2024-04-04 ENCOUNTER — Other Ambulatory Visit: Payer: Self-pay | Admitting: *Deleted

## 2024-04-04 DIAGNOSIS — K4090 Unilateral inguinal hernia, without obstruction or gangrene, not specified as recurrent: Secondary | ICD-10-CM

## 2024-04-04 DIAGNOSIS — K469 Unspecified abdominal hernia without obstruction or gangrene: Secondary | ICD-10-CM

## 2024-04-06 ENCOUNTER — Ambulatory Visit: Admitting: General Surgery

## 2024-08-23 ENCOUNTER — Encounter (HOSPITAL_COMMUNITY): Payer: Self-pay

## 2024-08-23 ENCOUNTER — Emergency Department (HOSPITAL_COMMUNITY)

## 2024-08-23 ENCOUNTER — Emergency Department (HOSPITAL_COMMUNITY)
Admission: EM | Admit: 2024-08-23 | Discharge: 2024-08-23 | Disposition: A | Discharge status: Home / Self Care | Attending: Emergency Medicine | Admitting: Emergency Medicine

## 2024-08-23 ENCOUNTER — Other Ambulatory Visit: Payer: Self-pay

## 2024-08-23 DIAGNOSIS — M79602 Pain in left arm: Secondary | ICD-10-CM | POA: Insufficient documentation

## 2024-08-23 MED ORDER — OXYCODONE-ACETAMINOPHEN 5-325 MG PO TABS
2.0000 | ORAL_TABLET | Freq: Once | ORAL | Status: AC
  Administered 2024-08-23: 2 via ORAL
  Filled 2024-08-23: qty 2

## 2024-08-23 NOTE — Discharge Instructions (Addendum)
 Please take 600 mg of ibuprofen  every 6 hours as needed for pain.  You can suck on Tylenol  3 to 4-hour mark.  If your pain is not improved within the next 7 to 10 days I would call orthopedics to schedule an appointment as you might need an MRI.  Please return to the emergency department for any worsening symptoms.

## 2024-08-23 NOTE — ED Provider Notes (Signed)
 " [place] EMERGENCY DEPARTMENT AT [facility] Provider Note   CSN: [id number] Arrival date & time: 08/23/24  1548     Patient presents with: Arm Injury   Marc [name] is a 29 y.o. male patient who presents to the emergency department today for further evaluation of left-sided arm pain that occurred prior to arrival.  Patient states he was coming down from a ladder when his boot fell and his arm got caught between 2 of the steps.  He endorses pain to the left upper extremity.  Has not take anything prior to arrival.  Did not hit his head or lose consciousness.    Arm Injury      Prior to Admission medications  Medication Sig Start Date End Date Taking? Authorizing Provider  dicyclomine  (BENTYL ) 20 MG tablet Take 1 tablet (20 mg total) by mouth 2 (two) times daily for 5 days. 11/19/22 11/24/22  Gowens, Mariah L, PA-C    Allergies: Aspirin and Promethazine hcl    Review of Systems  All other systems reviewed and are negative.   Updated Vital Signs BP 126/82   Pulse 95   Temp (!) 97.5 F (36.4 C) (Oral)   Resp 17   Ht 5' 5 (1.651 m)   Wt 59 kg   SpO2 100%   BMI 21.63 kg/m   Physical Exam Vitals and nursing note reviewed.  Constitutional:      Appearance: Normal appearance.  HENT:     Head: Normocephalic and atraumatic.  Eyes:     General:        Right eye: No discharge.        Left eye: No discharge.     Conjunctiva/sclera: Conjunctivae normal.  Pulmonary:     Effort: Pulmonary effort is normal.  Musculoskeletal:     Comments: 2+ radial pulse felt in the left wrist and is equal in comparison to the right.  Good cap refill.  Patient unable to perform flexion or extension at the elbow due to pain.  It is held at 90 degrees.  There is some soft tissue swelling along the distal tricep near the lateral, medial, and long head of the tricep muscle.  Seems consistent with hematoma.  Skin:    General: Skin is warm and dry.     Findings: No rash.   Neurological:     General: No focal deficit present.     Mental Status: He is alert.  Psychiatric:        Mood and Affect: Mood normal.        Behavior: Behavior normal.     (all labs ordered are listed, but only abnormal results are displayed) Labs Reviewed - No data to display  EKG: None  Radiology: DG Elbow Complete Left Result Date: 08/23/2024 CLINICAL DATA:  Recent fall from ladder, pain, decreased range of motion EXAM: LEFT ELBOW - COMPLETE 3+ VIEW COMPARISON:  08/23/2024 FINDINGS: There is no evidence of fracture, dislocation, or joint effusion. There is no evidence of arthropathy or other focal bone abnormality. Soft tissues are unremarkable. IMPRESSION: No acute or significant finding by plain radiography Electronically Signed   By: CHRISTELLA.  Shick M.D.   On: 08/23/2024 16:34   DG Humerus Left Result Date: 08/23/2024 CLINICAL DATA:  Recent fall, arm pain EXAM: LEFT HUMERUS - 2+ VIEW COMPARISON:  08/23/2024 FINDINGS: Intact left humerus. No joint abnormality. There is no evidence of fracture or other focal bone lesions. Soft tissues are unremarkable. IMPRESSION: No acute finding by  plain radiography Electronically Signed   By: CHRISTELLA.  Shick M.D.   On: 08/23/2024 16:32     Procedures   Medications Ordered in the ED  oxyCODONE -acetaminophen  (PERCOCET/ROXICET) 5-325 MG per tablet 2 tablet (has no administration in time range)     Medical Decision Making Marc [name] is a 29 y.o. male patient who presents to the emergency department today for further evaluation of left arm pain.  No evidence of compartment syndrome.  He is neurovascularly intact with good cap refill in the left upper extremity.  Will give him a sling for comfort.  Seems consistent with hematoma.  No evidence of fractures with imaging today.  Patient given to a Percocet here.  I will have him take NSAIDs over-the-counter at home.  I will give him orthopedic follow-up in case he needs MRI if symptoms do not improve in  the next week or so.  Strict return precautions were discussed.  He is safe for discharge.   Amount and/or Complexity of Data Reviewed Radiology: ordered.  Risk Prescription drug management.     Final diagnoses:  Pain of left upper extremity    ED Discharge Orders     None          Theotis Peers [place], [place] 08/23/24 1711  "

## 2024-08-23 NOTE — ED Triage Notes (Signed)
 Pt states he slipped & caught/hit his left arm on a metal ladder for a deer stand this morning. Reports pain & limited movement. Denies nay other injury at time of triage.
